# Patient Record
Sex: Male | Born: 1975 | Race: White | Hispanic: No | Smoking: Former smoker
Health system: Southern US, Community
[De-identification: ages and names within clinical notes are randomized; demographics above are authoritative.]

## PROBLEM LIST (undated history)

## (undated) DIAGNOSIS — R569 Unspecified convulsions: Secondary | ICD-10-CM

## (undated) DIAGNOSIS — F1011 Alcohol abuse, in remission: Secondary | ICD-10-CM

## (undated) DIAGNOSIS — Z8719 Personal history of other diseases of the digestive system: Secondary | ICD-10-CM

## (undated) DIAGNOSIS — I10 Essential (primary) hypertension: Secondary | ICD-10-CM

## (undated) DIAGNOSIS — K219 Gastro-esophageal reflux disease without esophagitis: Secondary | ICD-10-CM

## (undated) DIAGNOSIS — G629 Polyneuropathy, unspecified: Secondary | ICD-10-CM

## (undated) DIAGNOSIS — D696 Thrombocytopenia, unspecified: Secondary | ICD-10-CM

## (undated) DIAGNOSIS — F419 Anxiety disorder, unspecified: Secondary | ICD-10-CM

## (undated) DIAGNOSIS — T7840XA Allergy, unspecified, initial encounter: Secondary | ICD-10-CM

## (undated) DIAGNOSIS — K859 Acute pancreatitis without necrosis or infection, unspecified: Secondary | ICD-10-CM

## (undated) DIAGNOSIS — I639 Cerebral infarction, unspecified: Secondary | ICD-10-CM

## (undated) DIAGNOSIS — E119 Type 2 diabetes mellitus without complications: Secondary | ICD-10-CM

## (undated) HISTORY — DX: Allergy, unspecified, initial encounter: T78.40XA

## (undated) HISTORY — PX: ANTERIOR CRUCIATE LIGAMENT REPAIR: SHX115

## (undated) HISTORY — DX: Unspecified convulsions: R56.9

## (undated) HISTORY — DX: Acute pancreatitis without necrosis or infection, unspecified: K85.90

## (undated) HISTORY — DX: Type 2 diabetes mellitus without complications: E11.9

## (undated) HISTORY — DX: Thrombocytopenia, unspecified: D69.6

## (undated) HISTORY — DX: Anxiety disorder, unspecified: F41.9

## (undated) HISTORY — DX: Essential (primary) hypertension: I10

## (undated) HISTORY — DX: Cerebral infarction, unspecified: I63.9

---

## 2005-07-07 DIAGNOSIS — I10 Essential (primary) hypertension: Secondary | ICD-10-CM

## 2005-07-07 HISTORY — DX: Essential (primary) hypertension: I10

## 2011-09-17 ENCOUNTER — Inpatient Hospital Stay: Payer: Self-pay | Admitting: *Deleted

## 2011-09-17 LAB — URINALYSIS, COMPLETE
Bilirubin,UR: NEGATIVE
Glucose,UR: >=500 mg/dL (ref 0–75)
Leukocyte Esterase: NEGATIVE
Nitrite: NEGATIVE
Ph: 5 (ref 4.5–8.0)
Protein: 100
RBC,UR: 1 /HPF (ref 0–5)
Specific Gravity: 1.033 (ref 1.003–1.030)
Squamous Epithelial: <1
WBC UR: <1 /HPF (ref 0–5)

## 2011-09-17 LAB — BASIC METABOLIC PANEL
Anion Gap: 23 — ABNORMAL HIGH (ref 7–16)
BUN: 14 mg/dL (ref 7–18)
Calcium, Total: 9.3 mg/dL (ref 8.5–10.1)
Chloride: 114 mmol/L — ABNORMAL HIGH (ref 98–107)
Co2: 10 mmol/L — CL (ref 21–32)
Creatinine: 0.96 mg/dL (ref 0.60–1.30)
EGFR (African American): >60
EGFR (Non-African Amer.): >60
Glucose: 232 mg/dL — ABNORMAL HIGH (ref 65–99)
Osmolality: 300 (ref 275–301)
Potassium: 3.4 mmol/L — ABNORMAL LOW (ref 3.5–5.1)
Sodium: 147 mmol/L — ABNORMAL HIGH (ref 136–145)

## 2011-09-17 LAB — TROPONIN I: Troponin-I: <0.02 ng/mL

## 2011-09-17 LAB — CBC
HCT: 52.2 % — ABNORMAL HIGH (ref 40.0–52.0)
HGB: 17.4 g/dL (ref 13.0–18.0)
MCH: 30.5 pg (ref 26.0–34.0)
MCHC: 33.3 g/dL (ref 32.0–36.0)
MCV: 92 fL (ref 80–100)
Platelet: 196 10*3/uL (ref 150–440)
RBC: 5.71 10*6/uL (ref 4.40–5.90)
RDW: 13.4 % (ref 11.5–14.5)
WBC: 13.4 10*3/uL — ABNORMAL HIGH (ref 3.8–10.6)

## 2011-09-17 LAB — COMPREHENSIVE METABOLIC PANEL
Albumin: 4.3 g/dL (ref 3.4–5.0)
Alkaline Phosphatase: 132 U/L (ref 50–136)
Anion Gap: 26 — ABNORMAL HIGH (ref 7–16)
BUN: 13 mg/dL (ref 7–18)
Bilirubin,Total: 0.6 mg/dL (ref 0.2–1.0)
Calcium, Total: 9.4 mg/dL (ref 8.5–10.1)
Chloride: 102 mmol/L (ref 98–107)
Co2: 11 mmol/L — ABNORMAL LOW (ref 21–32)
Creatinine: 1.21 mg/dL (ref 0.60–1.30)
EGFR (African American): >60
EGFR (Non-African Amer.): >60
Glucose: 640 mg/dL (ref 65–99)
Osmolality: 308 (ref 275–301)
Potassium: 3.3 mmol/L — ABNORMAL LOW (ref 3.5–5.1)
SGOT(AST): 10 U/L — ABNORMAL LOW (ref 15–37)
SGPT (ALT): 25 U/L
Sodium: 139 mmol/L (ref 136–145)
Total Protein: 8.9 g/dL — ABNORMAL HIGH (ref 6.4–8.2)

## 2011-09-17 LAB — HEMOGLOBIN A1C: Hemoglobin A1C: 15.4 % — ABNORMAL HIGH (ref 4.2–6.3)

## 2011-09-17 LAB — MAGNESIUM: Magnesium: 2.2 mg/dL

## 2011-09-17 LAB — LIPASE, BLOOD: Lipase: 199 U/L (ref 73–393)

## 2011-09-17 LAB — CK TOTAL AND CKMB (NOT AT ARMC)
CK, Total: 51 U/L (ref 35–232)
CK-MB: <0.5 ng/mL — ABNORMAL LOW (ref 0.5–3.6)

## 2011-09-18 LAB — BASIC METABOLIC PANEL
Anion Gap: 18 — ABNORMAL HIGH (ref 7–16)
Anion Gap: 15 (ref 7–16)
BUN: 15 mg/dL (ref 7–18)
BUN: 13 mg/dL (ref 7–18)
Calcium, Total: 9.1 mg/dL (ref 8.5–10.1)
Calcium, Total: 8.6 mg/dL (ref 8.5–10.1)
Chloride: 111 mmol/L — ABNORMAL HIGH (ref 98–107)
Chloride: 107 mmol/L (ref 98–107)
Co2: 15 mmol/L — ABNORMAL LOW (ref 21–32)
Co2: 17 mmol/L — ABNORMAL LOW (ref 21–32)
Creatinine: 1.04 mg/dL (ref 0.60–1.30)
Creatinine: 1.09 mg/dL (ref 0.60–1.30)
EGFR (African American): >60
EGFR (African American): >60
EGFR (Non-African Amer.): >60
EGFR (Non-African Amer.): >60
Glucose: 215 mg/dL — ABNORMAL HIGH (ref 65–99)
Glucose: 340 mg/dL — ABNORMAL HIGH (ref 65–99)
Osmolality: 294 (ref 275–301)
Osmolality: 291 (ref 275–301)
Potassium: 3.3 mmol/L — ABNORMAL LOW (ref 3.5–5.1)
Potassium: 4.3 mmol/L (ref 3.5–5.1)
Sodium: 144 mmol/L (ref 136–145)
Sodium: 139 mmol/L (ref 136–145)

## 2011-09-18 LAB — TROPONIN I: Troponin-I: <0.02 ng/mL

## 2011-09-19 LAB — BASIC METABOLIC PANEL
Anion Gap: 20 — ABNORMAL HIGH (ref 7–16)
BUN: 13 mg/dL (ref 7–18)
Calcium, Total: 9 mg/dL (ref 8.5–10.1)
Chloride: 107 mmol/L (ref 98–107)
Co2: 14 mmol/L — ABNORMAL LOW (ref 21–32)
Creatinine: 0.87 mg/dL (ref 0.60–1.30)
EGFR (African American): >60
EGFR (Non-African Amer.): >60
Glucose: 228 mg/dL — ABNORMAL HIGH (ref 65–99)
Osmolality: 289 (ref 275–301)
Potassium: 3 mmol/L — ABNORMAL LOW (ref 3.5–5.1)
Sodium: 141 mmol/L (ref 136–145)

## 2011-09-19 LAB — MAGNESIUM: Magnesium: 1.7 mg/dL — ABNORMAL LOW

## 2011-09-19 LAB — CBC WITH DIFFERENTIAL/PLATELET
Basophil #: 0 10*3/uL (ref 0.0–0.1)
Basophil %: 0.6 %
Eosinophil #: 0.1 10*3/uL (ref 0.0–0.7)
Eosinophil %: 1.4 %
HCT: 38 % — ABNORMAL LOW (ref 40.0–52.0)
HGB: 12.9 g/dL — ABNORMAL LOW (ref 13.0–18.0)
Lymphocyte #: 1.5 10*3/uL (ref 1.0–3.6)
Lymphocyte %: 18.5 %
MCH: 30.4 pg (ref 26.0–34.0)
MCHC: 33.9 g/dL (ref 32.0–36.0)
MCV: 90 fL (ref 80–100)
Monocyte #: 0.6 10*3/uL (ref 0.0–0.7)
Monocyte %: 7.2 %
Neutrophil #: 5.7 10*3/uL (ref 1.4–6.5)
Neutrophil %: 72.3 %
Platelet: 97 10*3/uL — ABNORMAL LOW (ref 150–440)
RBC: 4.23 10*6/uL — ABNORMAL LOW (ref 4.40–5.90)
RDW: 13.2 % (ref 11.5–14.5)
WBC: 7.9 10*3/uL (ref 3.8–10.6)

## 2011-09-19 LAB — LIPID PANEL
Cholesterol: 126 mg/dL (ref 0–200)
HDL Cholesterol: 37 mg/dL — ABNORMAL LOW (ref 40–60)
Ldl Cholesterol, Calc: 62 mg/dL (ref 0–100)
Triglycerides: 136 mg/dL (ref 0–200)
VLDL Cholesterol, Calc: 27 mg/dL (ref 5–40)

## 2011-09-20 LAB — BASIC METABOLIC PANEL
Anion Gap: 12 (ref 7–16)
BUN: 11 mg/dL (ref 7–18)
Calcium, Total: 9.2 mg/dL (ref 8.5–10.1)
Chloride: 103 mmol/L (ref 98–107)
Co2: 21 mmol/L (ref 21–32)
Creatinine: 0.82 mg/dL (ref 0.60–1.30)
EGFR (African American): >60
EGFR (Non-African Amer.): >60
Glucose: 153 mg/dL — ABNORMAL HIGH (ref 65–99)
Osmolality: 274 (ref 275–301)
Potassium: 2.6 mmol/L — ABNORMAL LOW (ref 3.5–5.1)
Sodium: 136 mmol/L (ref 136–145)

## 2011-09-20 LAB — CBC WITH DIFFERENTIAL/PLATELET
Basophil #: 0.1 10*3/uL (ref 0.0–0.1)
Basophil %: 1.3 %
Eosinophil #: 0.2 10*3/uL (ref 0.0–0.7)
Eosinophil %: 2.7 %
HCT: 37.6 % — ABNORMAL LOW (ref 40.0–52.0)
HGB: 13 g/dL (ref 13.0–18.0)
Lymphocyte #: 1.8 10*3/uL (ref 1.0–3.6)
Lymphocyte %: 30.2 %
MCH: 30.6 pg (ref 26.0–34.0)
MCHC: 34.5 g/dL (ref 32.0–36.0)
MCV: 89 fL (ref 80–100)
Monocyte #: 0.6 10*3/uL (ref 0.0–0.7)
Monocyte %: 9.7 %
Neutrophil #: 3.4 10*3/uL (ref 1.4–6.5)
Neutrophil %: 56.1 %
Platelet: 89 10*3/uL — ABNORMAL LOW (ref 150–440)
RBC: 4.23 10*6/uL — ABNORMAL LOW (ref 4.40–5.90)
RDW: 12.8 % (ref 11.5–14.5)
WBC: 6.1 10*3/uL (ref 3.8–10.6)

## 2011-09-20 LAB — POTASSIUM
Potassium: 2.7 mmol/L — ABNORMAL LOW (ref 3.5–5.1)
Potassium: 3.3 mmol/L — ABNORMAL LOW (ref 3.5–5.1)

## 2011-09-20 LAB — RAPID HIV-1/2 QL/CONFIRM: HIV-1/2,Rapid Ql: NEGATIVE

## 2011-09-22 ENCOUNTER — Ambulatory Visit: Payer: Self-pay | Admitting: Internal Medicine

## 2011-09-22 LAB — CANCER CENTER HEMOGLOBIN: HGB: 13.1 g/dL (ref 13.0–18.0)

## 2011-09-24 LAB — CBC CANCER CENTER
Basophil #: 0.2 x10 3/mm — ABNORMAL HIGH (ref 0.0–0.1)
Basophil %: 2.1 %
Eosinophil #: 0.3 x10 3/mm (ref 0.0–0.7)
Eosinophil %: 3.9 %
HCT: 36.4 % — ABNORMAL LOW (ref 40.0–52.0)
HGB: 12.8 g/dL — ABNORMAL LOW (ref 13.0–18.0)
Lymphocyte #: 1.6 x10 3/mm (ref 1.0–3.6)
Lymphocyte %: 21.3 %
MCH: 31.2 pg (ref 26.0–34.0)
MCHC: 35.1 g/dL (ref 32.0–36.0)
MCV: 89 fL (ref 80–100)
Monocyte #: 1 x10 3/mm — ABNORMAL HIGH (ref 0.0–0.7)
Monocyte %: 14.1 %
Neutrophil #: 4.3 x10 3/mm (ref 1.4–6.5)
Neutrophil %: 58.6 %
Platelet: 126 x10 3/mm — ABNORMAL LOW (ref 150–440)
RBC: 4.09 10*6/uL — ABNORMAL LOW (ref 4.40–5.90)
RDW: 13.3 % (ref 11.5–14.5)
WBC: 7.3 x10 3/mm (ref 3.8–10.6)

## 2011-09-24 LAB — IRON AND TIBC
Iron Bind.Cap.(Total): 301 ug/dL (ref 250–450)
Iron Saturation: 19 %
Iron: 57 ug/dL — ABNORMAL LOW (ref 65–175)
Unbound Iron-Bind.Cap.: 244 ug/dL

## 2011-09-24 LAB — MAGNESIUM: Magnesium: 1.5 mg/dL — ABNORMAL LOW

## 2011-09-24 LAB — FERRITIN: Ferritin (ARMC): 784 ng/mL — ABNORMAL HIGH (ref 8–388)

## 2011-09-24 LAB — POTASSIUM: Potassium: 3.5 mmol/L (ref 3.5–5.1)

## 2011-09-24 LAB — RETICULOCYTES
Absolute Retic Count: 0.119 10*6/uL — ABNORMAL HIGH (ref 0.024–0.084)
Reticulocyte: 2.9 % — ABNORMAL HIGH (ref 0.5–1.5)

## 2011-09-25 LAB — PROT IMMUNOELECTROPHORES(ARMC)

## 2011-09-25 LAB — URINE IEP, RANDOM

## 2011-10-01 LAB — CANCER CTR PLATELET CT: Platelet: 200 x10 3/mm (ref 150–440)

## 2011-10-01 LAB — POTASSIUM: Potassium: 4.9 mmol/L (ref 3.5–5.1)

## 2011-10-06 ENCOUNTER — Ambulatory Visit: Payer: Self-pay | Admitting: Internal Medicine

## 2011-10-10 LAB — IRON AND TIBC
Iron Bind.Cap.(Total): 360 ug/dL (ref 250–450)
Iron Saturation: 23 %
Iron: 81 ug/dL (ref 65–175)
Unbound Iron-Bind.Cap.: 279 ug/dL

## 2011-10-10 LAB — CBC CANCER CENTER
Basophil #: 0.3 x10 3/mm — ABNORMAL HIGH (ref 0.0–0.1)
Basophil %: 3 %
Eosinophil #: 0.6 x10 3/mm (ref 0.0–0.7)
Eosinophil %: 5.8 %
HCT: 37 % — ABNORMAL LOW (ref 40.0–52.0)
HGB: 12.8 g/dL — ABNORMAL LOW (ref 13.0–18.0)
Lymphocyte #: 2.2 x10 3/mm (ref 1.0–3.6)
Lymphocyte %: 22.1 %
MCH: 30.8 pg (ref 26.0–34.0)
MCHC: 34.6 g/dL (ref 32.0–36.0)
MCV: 89 fL (ref 80–100)
Monocyte #: 0.7 x10 3/mm (ref 0.0–0.7)
Monocyte %: 6.7 %
Neutrophil #: 6.1 x10 3/mm (ref 1.4–6.5)
Neutrophil %: 62.4 %
Platelet: 272 x10 3/mm (ref 150–440)
RBC: 4.15 10*6/uL — ABNORMAL LOW (ref 4.40–5.90)
RDW: 13.7 % (ref 11.5–14.5)
WBC: 9.7 x10 3/mm (ref 3.8–10.6)

## 2011-10-10 LAB — FERRITIN: Ferritin (ARMC): 513 ng/mL — ABNORMAL HIGH (ref 8–388)

## 2011-10-10 LAB — CREATININE, SERUM
Creatinine: 0.75 mg/dL (ref 0.60–1.30)
EGFR (African American): >60
EGFR (Non-African Amer.): >60

## 2011-10-10 LAB — POTASSIUM: Potassium: 4 mmol/L (ref 3.5–5.1)

## 2011-10-30 LAB — CANCER CTR PLATELET CT: Platelet: 191 x10 3/mm (ref 150–440)

## 2011-10-30 LAB — CANCER CENTER HEMOGLOBIN: HGB: 13.9 g/dL (ref 13.0–18.0)

## 2011-11-05 ENCOUNTER — Ambulatory Visit: Payer: Self-pay | Admitting: Internal Medicine

## 2013-08-31 DIAGNOSIS — I639 Cerebral infarction, unspecified: Secondary | ICD-10-CM

## 2013-08-31 HISTORY — DX: Cerebral infarction, unspecified: I63.9

## 2014-08-07 DIAGNOSIS — R569 Unspecified convulsions: Secondary | ICD-10-CM

## 2014-08-07 HISTORY — DX: Unspecified convulsions: R56.9

## 2014-08-31 ENCOUNTER — Inpatient Hospital Stay (HOSPITAL_COMMUNITY)
Admission: AD | Admit: 2014-08-31 | Discharge: 2014-09-04 | DRG: 064 | Disposition: A | Discharge status: Home / Self Care | Payer: BLUE CROSS/BLUE SHIELD | Source: Other Acute Inpatient Hospital | Attending: Neurology | Admitting: Neurology

## 2014-08-31 ENCOUNTER — Encounter (HOSPITAL_COMMUNITY): Payer: Self-pay | Admitting: *Deleted

## 2014-08-31 ENCOUNTER — Emergency Department: Payer: Self-pay | Admitting: Emergency Medicine

## 2014-08-31 DIAGNOSIS — F1010 Alcohol abuse, uncomplicated: Secondary | ICD-10-CM | POA: Diagnosis present

## 2014-08-31 DIAGNOSIS — E669 Obesity, unspecified: Secondary | ICD-10-CM | POA: Diagnosis present

## 2014-08-31 DIAGNOSIS — G4700 Insomnia, unspecified: Secondary | ICD-10-CM | POA: Diagnosis present

## 2014-08-31 DIAGNOSIS — E1165 Type 2 diabetes mellitus with hyperglycemia: Secondary | ICD-10-CM | POA: Diagnosis present

## 2014-08-31 DIAGNOSIS — I611 Nontraumatic intracerebral hemorrhage in hemisphere, cortical: Secondary | ICD-10-CM | POA: Diagnosis present

## 2014-08-31 DIAGNOSIS — Z9119 Patient's noncompliance with other medical treatment and regimen: Secondary | ICD-10-CM | POA: Diagnosis present

## 2014-08-31 DIAGNOSIS — R569 Unspecified convulsions: Secondary | ICD-10-CM

## 2014-08-31 DIAGNOSIS — F411 Generalized anxiety disorder: Secondary | ICD-10-CM | POA: Diagnosis present

## 2014-08-31 DIAGNOSIS — G936 Cerebral edema: Secondary | ICD-10-CM | POA: Diagnosis present

## 2014-08-31 DIAGNOSIS — I10 Essential (primary) hypertension: Secondary | ICD-10-CM | POA: Diagnosis present

## 2014-08-31 DIAGNOSIS — Z9114 Patient's other noncompliance with medication regimen: Secondary | ICD-10-CM

## 2014-08-31 DIAGNOSIS — G4089 Other seizures: Secondary | ICD-10-CM | POA: Diagnosis present

## 2014-08-31 DIAGNOSIS — R58 Hemorrhage, not elsewhere classified: Secondary | ICD-10-CM

## 2014-08-31 DIAGNOSIS — I619 Nontraumatic intracerebral hemorrhage, unspecified: Secondary | ICD-10-CM | POA: Diagnosis present

## 2014-08-31 DIAGNOSIS — E119 Type 2 diabetes mellitus without complications: Secondary | ICD-10-CM

## 2014-08-31 LAB — GLUCOSE, CAPILLARY
Glucose-Capillary: 456 mg/dL — ABNORMAL HIGH (ref 70–99)
Glucose-Capillary: 329 mg/dL — ABNORMAL HIGH (ref 70–99)

## 2014-08-31 LAB — MRSA PCR SCREENING: MRSA by PCR: NEGATIVE

## 2014-08-31 MED ORDER — FOLIC ACID 1 MG PO TABS
1.0000 mg | ORAL_TABLET | Freq: Every day | ORAL | Status: DC
  Administered 2014-08-31 – 2014-09-04 (×4): 1 mg via ORAL
  Filled 2014-08-31 (×6): qty 1

## 2014-08-31 MED ORDER — LORAZEPAM 2 MG/ML IJ SOLN
0.0000 mg | Freq: Four times a day (QID) | INTRAMUSCULAR | Status: AC
Start: 2014-08-31 — End: 2014-09-02

## 2014-08-31 MED ORDER — ACETAMINOPHEN 650 MG RE SUPP: 650.0000 mg | RECTAL | Status: DC | PRN

## 2014-08-31 MED ORDER — PNEUMOCOCCAL VAC POLYVALENT 25 MCG/0.5ML IJ INJ
0.5000 mL | INJECTION | INTRAMUSCULAR | Status: AC
  Administered 2014-09-01: 0.5 mL via INTRAMUSCULAR
  Filled 2014-08-31: qty 0.5

## 2014-08-31 MED ORDER — LORAZEPAM 2 MG/ML IJ SOLN
1.0000 mg | Freq: Four times a day (QID) | INTRAMUSCULAR | Status: AC | PRN
  Administered 2014-09-02: 1 mg via INTRAVENOUS
  Filled 2014-08-31: qty 1

## 2014-08-31 MED ORDER — LEVETIRACETAM 500 MG PO TABS
500.0000 mg | ORAL_TABLET | Freq: Two times a day (BID) | ORAL | Status: DC
  Administered 2014-09-01 – 2014-09-04 (×7): 500 mg via ORAL
  Filled 2014-08-31 (×7): qty 1

## 2014-08-31 MED ORDER — LEVETIRACETAM IN NACL 1000 MG/100ML IV SOLN
1000.0000 mg | Freq: Once | INTRAVENOUS | Status: AC
  Administered 2014-08-31: 1000 mg via INTRAVENOUS
  Filled 2014-08-31 (×2): qty 100

## 2014-08-31 MED ORDER — SENNOSIDES-DOCUSATE SODIUM 8.6-50 MG PO TABS
1.0000 | ORAL_TABLET | Freq: Two times a day (BID) | ORAL | Status: DC
Start: 2014-08-31 — End: 2014-09-04
  Administered 2014-08-31 – 2014-09-04 (×7): 1 via ORAL
  Filled 2014-08-31 (×8): qty 1

## 2014-08-31 MED ORDER — LABETALOL HCL 5 MG/ML IV SOLN
10.0000 mg | INTRAVENOUS | Status: DC | PRN
  Administered 2014-09-01: 20 mg via INTRAVENOUS
  Filled 2014-08-31: qty 4

## 2014-08-31 MED ORDER — INFLUENZA VAC SPLIT QUAD 0.5 ML IM SUSY
0.5000 mL | PREFILLED_SYRINGE | INTRAMUSCULAR | Status: AC
  Administered 2014-09-01: 0.5 mL via INTRAMUSCULAR
  Filled 2014-08-31: qty 0.5

## 2014-08-31 MED ORDER — ACETAMINOPHEN 325 MG PO TABS: 650.0000 mg | ORAL_TABLET | ORAL | Status: DC | PRN

## 2014-08-31 MED ORDER — THIAMINE HCL 100 MG/ML IJ SOLN
100.0000 mg | Freq: Every day | INTRAMUSCULAR | Status: DC
  Administered 2014-09-01: 100 mg via INTRAVENOUS
  Filled 2014-08-31 (×2): qty 2
  Filled 2014-08-31 (×2): qty 1
  Filled 2014-08-31: qty 2

## 2014-08-31 MED ORDER — LORAZEPAM 2 MG/ML IJ SOLN: 0.0000 mg | Freq: Two times a day (BID) | INTRAMUSCULAR | Status: DC

## 2014-08-31 MED ORDER — VITAMIN B-1 100 MG PO TABS
100.0000 mg | ORAL_TABLET | Freq: Every day | ORAL | Status: DC
  Administered 2014-08-31 – 2014-09-04 (×4): 100 mg via ORAL
  Filled 2014-08-31 (×6): qty 1

## 2014-08-31 MED ORDER — LORAZEPAM 1 MG PO TABS: 1.0000 mg | ORAL_TABLET | Freq: Four times a day (QID) | ORAL | Status: AC | PRN

## 2014-08-31 MED ORDER — PANTOPRAZOLE SODIUM 40 MG IV SOLR
40.0000 mg | Freq: Every day | INTRAVENOUS | Status: DC
Start: 2014-08-31 — End: 2014-09-04
  Administered 2014-08-31 – 2014-09-03 (×4): 40 mg via INTRAVENOUS
  Filled 2014-08-31 (×4): qty 40

## 2014-08-31 MED ORDER — STROKE: EARLY STAGES OF RECOVERY BOOK
Freq: Once | Status: AC
  Administered 2014-08-31: 19:00:00
  Filled 2014-08-31: qty 1

## 2014-08-31 MED ORDER — INSULIN ASPART 100 UNIT/ML ~~LOC~~ SOLN
0.0000 [IU] | Freq: Every day | SUBCUTANEOUS | Status: DC
  Administered 2014-08-31 – 2014-09-01 (×2): 4 [IU] via SUBCUTANEOUS
  Administered 2014-09-02: 3 [IU] via SUBCUTANEOUS
  Administered 2014-09-03: 4 [IU] via SUBCUTANEOUS

## 2014-08-31 MED ORDER — ADULT MULTIVITAMIN W/MINERALS CH
1.0000 | ORAL_TABLET | Freq: Every day | ORAL | Status: DC
  Administered 2014-08-31 – 2014-09-04 (×4): 1 via ORAL
  Filled 2014-08-31 (×5): qty 1

## 2014-08-31 MED ORDER — INSULIN ASPART 100 UNIT/ML ~~LOC~~ SOLN
0.0000 [IU] | Freq: Three times a day (TID) | SUBCUTANEOUS | Status: DC
  Administered 2014-09-01: 5 [IU] via SUBCUTANEOUS
  Administered 2014-09-01: 8 [IU] via SUBCUTANEOUS
  Administered 2014-09-01: 15 [IU] via SUBCUTANEOUS
  Administered 2014-09-02: 11 [IU] via SUBCUTANEOUS
  Administered 2014-09-02: 8 [IU] via SUBCUTANEOUS
  Administered 2014-09-02: 5 [IU] via SUBCUTANEOUS
  Administered 2014-09-03: 11 [IU] via SUBCUTANEOUS
  Administered 2014-09-03 (×2): 8 [IU] via SUBCUTANEOUS
  Administered 2014-09-04: 5 [IU] via SUBCUTANEOUS

## 2014-08-31 NOTE — H&P (Addendum)
Admission H&P    Chief Complaint: Left sided twitching HPI: Daniel Ritter is an 39 y.o. male who reports that he awakened this morning with left sided tingling, drooling and involuntary movements of his tongue.  He went to work and had two repeat episodes and was noted by co-workers to "zone out"  Patient reports that he remembers everything but was unable to control things at times.  Reports that for the past week he has had episodes where he will have tingling in his left arm and the left side of his head including his scalp.  These episodes have been short-lived and resolved spontaneously.   Patient reports hitting his head on his truck at work on the top of his head.  There was no loss of consciousness but he did develop a hematoma.  Date last known well: Date: 08/30/2014 Time last known well: Time: 23:30 tPA Given: No: ICH  ICH Score: 0     Past medical history:  Diabetes (taken off all meds due to diet control), HTN  Past surgical history: None  Family history: Father with prostate CA and CAD, Mother with hypertension  Social History:  Patient does not smoke.  Drinks daily.  No history of illicit drug abuse  Allergies: NKDA  Medications: Maxzide    ROS: History obtained from the patient  General ROS: negative for - chills, fatigue, fever, night sweats, weight gain or weight loss Psychological ROS: negative for - behavioral disorder, hallucinations, memory difficulties, mood swings or suicidal ideation Ophthalmic ROS: negative for - blurry vision, double vision, eye pain or loss of vision ENT ROS: negative for - epistaxis, nasal discharge, oral lesions, sore throat, tinnitus or vertigo Allergy and Immunology ROS: negative for - hives or itchy/watery eyes Hematological and Lymphatic ROS: negative for - bleeding problems, bruising or swollen lymph nodes Endocrine ROS: polydipsia/polyuria  Respiratory ROS: negative for - cough, hemoptysis, shortness of breath or  wheezing Cardiovascular ROS: negative for - chest pain, dyspnea on exertion, edema or irregular heartbeat Gastrointestinal ROS: negative for - abdominal pain, diarrhea, hematemesis, nausea/vomiting or stool incontinence Genito-Urinary ROS: negative for - dysuria, hematuria, incontinence or urinary frequency/urgency Musculoskeletal ROS: negative for - joint swelling or muscular weakness Neurological ROS: as noted in HPI Dermatological ROS: rash occasionally on arms  Physical Examination: Blood pressure 153/93, pulse 93, temperature 98.7 F (37.1 C), resp. rate 23, SpO2 94 %.  General Examination: HEENT-  Normocephalic, no lesions, without obvious abnormality.  Normal external eye and conjunctiva.  Normal TM's bilaterally.  Normal auditory canals and external ears. Normal external nose, mucus membranes and septum.  Normal pharynx. Cardiovascular- S1, S2 normal, pulses palpable throughout   Lungs- chest clear, no wheezing, rales, normal symmetric air entry Abdomen- soft, non-tender; bowel sounds normal; no masses,  no organomegaly Extremities- no edema Lymph-no adenopathy palpable Musculoskeletal-no joint tenderness, deformity or swelling Skin-small rash on forearms bilaterally  Neurological Examination Mental Status: Alert, oriented, thought content appropriate.  Speech fluent without evidence of aphasia.  Able to follow 3 step commands without difficulty. Cranial Nerves: II: Discs flat bilaterally; Visual fields grossly normal, pupils equal, round, reactive to light and accommodation III,IV, VI: ptosis not present, extra-ocular motions intact bilaterally V,VII: smile symmetric, facial light touch sensation normal bilaterally VIII: hearing normal bilaterally IX,X: gag reflex present XI: bilateral shoulder shrug XII: midline tongue extension Motor: Right : Upper extremity   5/5    Left:     Upper extremity   5/5  Lower extremity  5/5     Lower extremity   5/5 Tone and bulk:normal  tone throughout; no atrophy noted. Some involuntary twitching of the bottom lip on the left Sensory: Pinprick and light touch intact throughout, bilaterally Deep Tendon Reflexes: 2+ and symmetric throughout Plantars: Right: downgoing   Left: downgoing Cerebellar: normal finger-to-nose and normal heel-to-shin testing bilaterally Gait: Unable to test due to safety   Laboratory Studies:  Labs from [place] reviewed.  Patient with mildly elevated AP at 120 and CBG of 496.  Glucose in urine. Basic Metabolic Panel: No results for input(s): NA, K, CL, CO2, GLUCOSE, BUN, CREATININE, CALCIUM, MG, PHOS in the last 168 hours.  Liver Function Tests: No results for input(s): AST, ALT, ALKPHOS, BILITOT, PROT, ALBUMIN in the last 168 hours. No results for input(s): LIPASE, AMYLASE in the last 168 hours. No results for input(s): AMMONIA in the last 168 hours.  CBC: No results for input(s): WBC, NEUTROABS, HGB, HCT, MCV, PLT in the last 168 hours.  Cardiac Enzymes: No results for input(s): CKTOTAL, CKMB, CKMBINDEX, TROPONINI in the last 168 hours.  BNP: Invalid input(s): POCBNP  CBG: No results for input(s): GLUCAP in the last 168 hours.  Microbiology: No results found for this or any previous visit.  Coagulation Studies: No results for input(s): LABPROT, INR in the last 72 hours.  Urinalysis: No results for input(s): COLORURINE, LABSPEC, PHURINE, GLUCOSEU, HGBUR, BILIRUBINUR, KETONESUR, PROTEINUR, UROBILINOGEN, NITRITE, LEUKOCYTESUR in the last 168 hours.  Invalid input(s): APPERANCEUR  Lipid Panel:  No results found for: CHOL, TRIG, HDL, CHOLHDL, VLDL, LDLCALC  HgbA1C: No results found for: HGBA1C  Urine Drug Screen:  No results found for: LABOPIA, COCAINSCRNUR, LABBENZ, AMPHETMU, THCU, LABBARB  Alcohol Level: No results for input(s): ETH in the last 168 hours.  Other results: EKG: normal sinus rhythm at 90 bpm.  Imaging: No results found.  Assessment: 39 y.o. male presenting  with episodes of left sided tingling with involuntary movements on the left side of the face.  Likely partial seizures.  Head CT personally reviewed and shows a small right parietal intracerebral hemorrhage.  Etiology unclear. Patient also with poorly controlled diabetes.  Initially BP was elevated but has improved spontaneously.    Stroke Risk Factors - diabetes mellitus and hypertension  Plan: 1. HgbA1c, fasting lipid panel 2. MRI, MRA  of the brain without contrast 3. PT consult, OT consult, Speech consult 4. Echocardiogram 5. Carotid dopplers 6. Prophylactic therapy-None 7. NPO until RN stroke swallow screen.  Afterwards will start a carb modified diet.   8. Telemetry monitoring 9. Frequent neuro checks 10. CBG qAC and QHS with coverage 11. BP control 12. CIWA 13. Keppra 1000mg  IV now with maintenance of 500mg  BID to start in AM.    This patient is critically ill and at significant risk of neurological worsening, death and care requires constant monitoring of vital signs, hemodynamics,respiratory and cardiac monitoring, neurological assessment, discussion with family, other specialists and medical decision making of high complexity. I spent 60inutes of neurocritical care time  in the care of  this patient.   Alexis Goodell, MD [facility] [phone] 08/31/2014, 6:56 PM

## 2014-09-01 ENCOUNTER — Inpatient Hospital Stay (HOSPITAL_COMMUNITY): Payer: BLUE CROSS/BLUE SHIELD

## 2014-09-01 DIAGNOSIS — G936 Cerebral edema: Secondary | ICD-10-CM

## 2014-09-01 DIAGNOSIS — R58 Hemorrhage, not elsewhere classified: Secondary | ICD-10-CM

## 2014-09-01 LAB — PROTIME-INR
INR: 1.01 (ref 0.00–1.49)
Prothrombin Time: 13.4 seconds (ref 11.6–15.2)

## 2014-09-01 LAB — GLUCOSE, CAPILLARY
Glucose-Capillary: 230 mg/dL — ABNORMAL HIGH (ref 70–99)
Glucose-Capillary: 398 mg/dL — ABNORMAL HIGH (ref 70–99)
Glucose-Capillary: 291 mg/dL — ABNORMAL HIGH (ref 70–99)
Glucose-Capillary: 332 mg/dL — ABNORMAL HIGH (ref 70–99)

## 2014-09-01 LAB — BASIC METABOLIC PANEL
Anion gap: 6 (ref 5–15)
BUN: 11 mg/dL (ref 6–23)
CO2: 24 mmol/L (ref 19–32)
Calcium: 8.8 mg/dL (ref 8.4–10.5)
Chloride: 103 mmol/L (ref 96–112)
Creatinine, Ser: 0.8 mg/dL (ref 0.50–1.35)
GFR calc Af Amer: >90 mL/min (ref >90)
GFR calc non Af Amer: >90 mL/min (ref >90)
Glucose, Bld: 306 mg/dL — ABNORMAL HIGH (ref 70–99)
Potassium: 3.6 mmol/L (ref 3.5–5.1)
Sodium: 133 mmol/L — ABNORMAL LOW (ref 135–145)

## 2014-09-01 LAB — CBC
HCT: 39.6 % (ref 39.0–52.0)
Hemoglobin: 13.9 g/dL (ref 13.0–17.0)
MCH: 30 pg (ref 26.0–34.0)
MCHC: 35.1 g/dL (ref 30.0–36.0)
MCV: 85.5 fL (ref 78.0–100.0)
Platelets: 140 10*3/uL — ABNORMAL LOW (ref 150–400)
RBC: 4.63 MIL/uL (ref 4.22–5.81)
RDW: 13 % (ref 11.5–15.5)
WBC: 7 10*3/uL (ref 4.0–10.5)

## 2014-09-01 LAB — RAPID URINE DRUG SCREEN, HOSP PERFORMED
Amphetamines: NOT DETECTED
Barbiturates: NOT DETECTED
Benzodiazepines: POSITIVE — AB
Cocaine: NOT DETECTED
Opiates: NOT DETECTED
Tetrahydrocannabinol: NOT DETECTED

## 2014-09-01 LAB — APTT: aPTT: 26 seconds (ref 24–37)

## 2014-09-01 LAB — SEDIMENTATION RATE: Sed Rate: 17 mm/hr — ABNORMAL HIGH (ref 0–16)

## 2014-09-01 MED ORDER — HYDRALAZINE HCL 20 MG/ML IJ SOLN
INTRAMUSCULAR | Status: AC
  Administered 2014-09-01: 5 mg via INTRAVENOUS
  Filled 2014-09-01: qty 1

## 2014-09-01 MED ORDER — HEPARIN SODIUM (PORCINE) 1000 UNIT/ML IJ SOLN
INTRAMUSCULAR | Status: AC | PRN
  Administered 2014-09-01: 1000 [IU] via INTRAVENOUS
  Administered 2014-09-01: 500 [IU] via INTRAVENOUS

## 2014-09-01 MED ORDER — MIDAZOLAM HCL 2 MG/2ML IJ SOLN
INTRAMUSCULAR | Status: AC | PRN
  Administered 2014-09-01: 1 mg via INTRAVENOUS

## 2014-09-01 MED ORDER — HYDRALAZINE HCL 20 MG/ML IJ SOLN
INTRAMUSCULAR | Status: AC
  Filled 2014-09-01: qty 1

## 2014-09-01 MED ORDER — HYDRALAZINE HCL 20 MG/ML IJ SOLN
5.0000 mg | INTRAMUSCULAR | Status: DC
  Administered 2014-09-01 (×3): 5 mg via INTRAVENOUS

## 2014-09-01 MED ORDER — FENTANYL CITRATE 0.05 MG/ML IJ SOLN
INTRAMUSCULAR | Status: AC
  Filled 2014-09-01: qty 4

## 2014-09-01 MED ORDER — IOHEXOL 300 MG/ML  SOLN
150.0000 mL | Freq: Once | INTRAMUSCULAR | Status: AC | PRN
  Administered 2014-09-01: 80 mL via INTRAVENOUS

## 2014-09-01 MED ORDER — MIDAZOLAM HCL 2 MG/2ML IJ SOLN
INTRAMUSCULAR | Status: AC
  Filled 2014-09-01: qty 4

## 2014-09-01 MED ORDER — FENTANYL CITRATE 0.05 MG/ML IJ SOLN
INTRAMUSCULAR | Status: AC | PRN
  Administered 2014-09-01 (×2): 25 ug via INTRAVENOUS

## 2014-09-01 MED ORDER — HEPARIN SOD (PORK) LOCK FLUSH 100 UNIT/ML IV SOLN
INTRAVENOUS | Status: AC
  Filled 2014-09-01: qty 10

## 2014-09-01 MED ORDER — SODIUM CHLORIDE 0.9 % IV SOLN: INTRAVENOUS | Status: AC

## 2014-09-01 NOTE — Progress Notes (Signed)
OT Cancellation Note  Patient Details Name: Daniel Ritter MRN: [id number] DOB: Jul 16, 1975   Cancelled Treatment:    Reason Eval/Treat Not Completed: Medical issues which prohibited therapy. Pt on bedrest.  Almon Register [phone] 09/01/2014, 11:24 AM

## 2014-09-01 NOTE — Progress Notes (Addendum)
Discussed case with Dr. Armida Sans, with negative arteriogram and scheduled for TEE in AM. Order to move pt to floor; 4N verified with MD.

## 2014-09-01 NOTE — Progress Notes (Signed)
Pt transported to IR with transport and RN.

## 2014-09-01 NOTE — Procedures (Signed)
S/P 4 vessel cerebral arteriogram. RT CFA aprroach. Findings. 1.No occlusions,stenosis,dissections ,AVshunting or aneurysms  seen. Venous outflow WNLs

## 2014-09-01 NOTE — Progress Notes (Signed)
PT Cancellation Note  Patient Details Name: Daniel Ritter MRN: [id number] DOB: 1976-01-04   Cancelled Treatment:    Reason Eval/Treat Not Completed: Patient not medically ready.  Pt currently on bedrest.  Please update activity order when pt becomes appropriate for mobility.  Will f/u as appropriate.     Braniya Farrugia, Thornton Papas 09/01/2014, 7:51 AM

## 2014-09-01 NOTE — Consult Note (Signed)
Chief Complaint: Intracranial hemorrhage  Referring Physician(s): Dr Doy Mince  History of Present Illness: Daniel Ritter is a 39 y.o. male   Pt developed left sided facial tingling off and on for few days Worsened yesterday and accompanied by L facial twitching and involuntary tongue movements Did admit to hitting head at work the other day with + hematoma CT head reveals R parietal intracranial hemorrhage Now scheduled for cerebral arteriogram for further evaluation  History reviewed. No pertinent past medical history.  History reviewed. No pertinent past surgical history.  Allergies: Review of patient's allergies indicates not on file.  Medications: Prior to Admission medications   Medication Sig Start Date End Date Taking? Authorizing Provider  lisinopril-hydrochlorothiazide (PRINZIDE,ZESTORETIC) 20-12.5 MG per tablet Take 1 tablet by mouth daily.   Yes Historical Provider, MD  metoprolol succinate (TOPROL-XL) 100 MG 24 hr tablet Take 100 mg by mouth at bedtime. Take with or immediately following a meal.   Yes Historical Provider, MD     History reviewed. No pertinent family history.  History   Social History  . Marital Status: Married    Spouse Name: N/A  . Number of Children: N/A  . Years of Education: N/A   Social History Main Topics  . Smoking status: Not on file  . Smokeless tobacco: Not on file  . Alcohol Use: Not on file  . Drug Use: Not on file  . Sexual Activity: Not on file   Other Topics Concern  . None   Social History Narrative  . None     Review of Systems: A 12 point ROS discussed and pertinent positives are indicated in the HPI above.  All other systems are negative.  Review of Systems  Constitutional: Negative for activity change and fatigue.  HENT: Negative for trouble swallowing and voice change.   Eyes: Negative for visual disturbance.  Respiratory: Negative for shortness of breath.   Cardiovascular: Negative for chest pain.    Gastrointestinal: Negative for abdominal pain.  Genitourinary: Negative for dysuria.  Musculoskeletal: Negative for gait problem, neck pain and neck stiffness.  Neurological: Positive for seizures, weakness, numbness and headaches. Negative for dizziness, syncope, facial asymmetry, speech difficulty and light-headedness.  Psychiatric/Behavioral: Negative for behavioral problems and confusion.     Vital Signs: BP 150/99 mmHg  Pulse 83  Temp(Src) 98.6 F (37 C) (Oral)  Resp 17  Ht 6\' 1"  (1.854 m)  Wt 136.6 kg (301 lb 2.4 oz)  BMI 39.74 kg/m2  SpO2 97%  Physical Exam  Constitutional: He is oriented to person, place, and time. He appears well-developed and well-nourished.  Cardiovascular: Normal rate, regular rhythm and normal heart sounds.   No murmur heard. Pulmonary/Chest: Effort normal and breath sounds normal. He has no wheezes.  Abdominal: Soft. Bowel sounds are normal. There is no tenderness.  Musculoskeletal: Normal range of motion.  Neurological: He is alert and oriented to person, place, and time.  Skin: Skin is warm and dry.  Psychiatric: He has a normal mood and affect. His behavior is normal. Judgment and thought content normal.  Nursing note and vitals reviewed.   Mallampati Score:  MD Evaluation Airway: WNL Heart: WNL Abdomen: WNL Chest/ Lungs: WNL ASA  Classification: 3 Mallampati/Airway Score: One  Imaging: Mr Virgel Paling Wo Contrast  09/01/2014   CLINICAL DATA:  Intercerebral hemorrhage. Left-sided tingling, drooling, and involuntary tongue movements. Patient hit top of head on truck at work ; no loss of consciousness but positive soft tissue hematoma.  EXAM: MRI  HEAD WITHOUT CONTRAST  MRA HEAD WITHOUT CONTRAST  TECHNIQUE: Multiplanar, multiecho pulse sequences of the brain and surrounding structures were obtained without intravenous contrast. Angiographic images of the head were obtained using MRA technique without contrast.  COMPARISON:  Head CT 08/31/2014   FINDINGS: MRI HEAD FINDINGS  There is no acute infarct, midline shift, or extra-axial fluid collection. As seen on recent CT, there is a 1.3 cm focus of hemorrhage in the right parietal operculum which demonstrates peripheral T1 hyperintensity and diffuse but mildly heterogeneous T2 hyperintensity with associated susceptibility artifact including suggestion of a thin peripheral rim of susceptibility. There is also suggestion of a small amount of perilesional T1 hyperintensity. There is at most minimal surrounding edema.  Mildly dilated perivascular spaces versus punctate lacunar infarcts are noted posteriorly in the right lentiform nucleus. Ventricles and sulci are normal for age. No significant white matter disease is present.  Orbits are unremarkable. Paranasal sinuses and mastoid air cells are clear. Major intracranial vascular flow voids are preserved.  MRA HEAD FINDINGS  Images are moderately degraded by motion artifact. Visualized distal vertebral arteries are patent with the left being dominant and without significant stenosis. PICA and SCA origins are grossly patent. There is apparent narrowing of the proximal left greater than right SCA is, however evaluation is limited by motion artifact through this region. Basilar artery is patent without evidence of significant stenosis. PCAs are patent without significant stenosis. Posterior communicating arteries are not clearly identified.  Internal carotid arteries are patent from skullbase to carotid termini without evidence of significant stenosis, although the cavernous segments are suboptimally evaluated due to motion artifact. M1 segments are patent without stenosis. All major MCA branch vessels are grossly patent, with evaluation for stenosis limited by motion. ACAs are patent without evidence of significant proximal stenosis. No sizable intracranial aneurysm is identified.  IMPRESSION: 1. 1.3 cm focus of subacute hemorrhage in the right parietal operculum  with only trace surrounding edema. This is favored to reflect recent hemorrhage in a cavernoma. Posttraumatic hemorrhagic contusion is possible. Underlying mass/ neoplasm is felt unlikely but not completely excluded, and postcontrast imaging is recommended. 2. No acute infarct. 3. Moderately motion degraded MRA without major intracranial arterial occlusion or significant proximal stenosis identified.   Electronically Signed   By: Logan Bores   On: 09/01/2014 07:52   [name]  09/01/2014   CLINICAL DATA:  Intercerebral hemorrhage. Left-sided tingling, drooling, and involuntary tongue movements. Patient hit top of head on truck at work ; no loss of consciousness but positive soft tissue hematoma.  EXAM: MRI HEAD WITHOUT CONTRAST  MRA HEAD WITHOUT CONTRAST  TECHNIQUE: Multiplanar, multiecho pulse sequences of the brain and surrounding structures were obtained without intravenous contrast. Angiographic images of the head were obtained using MRA technique without contrast.  COMPARISON:  Head CT 08/31/2014  FINDINGS: MRI HEAD FINDINGS  There is no acute infarct, midline shift, or extra-axial fluid collection. As seen on recent CT, there is a 1.3 cm focus of hemorrhage in the right parietal operculum which demonstrates peripheral T1 hyperintensity and diffuse but mildly heterogeneous T2 hyperintensity with associated susceptibility artifact including suggestion of a thin peripheral rim of susceptibility. There is also suggestion of a small amount of perilesional T1 hyperintensity. There is at most minimal surrounding edema.  Mildly dilated perivascular spaces versus punctate lacunar infarcts are noted posteriorly in the right lentiform nucleus. Ventricles and sulci are normal for age. No significant white matter disease is present.  Orbits are unremarkable.  Paranasal sinuses and mastoid air cells are clear. Major intracranial vascular flow voids are preserved.  MRA HEAD FINDINGS  Images are moderately  degraded by motion artifact. Visualized distal vertebral arteries are patent with the left being dominant and without significant stenosis. PICA and SCA origins are grossly patent. There is apparent narrowing of the proximal left greater than right SCA is, however evaluation is limited by motion artifact through this region. Basilar artery is patent without evidence of significant stenosis. PCAs are patent without significant stenosis. Posterior communicating arteries are not clearly identified.  Internal carotid arteries are patent from skullbase to carotid termini without evidence of significant stenosis, although the cavernous segments are suboptimally evaluated due to motion artifact. M1 segments are patent without stenosis. All major MCA branch vessels are grossly patent, with evaluation for stenosis limited by motion. ACAs are patent without evidence of significant proximal stenosis. No sizable intracranial aneurysm is identified.  IMPRESSION: 1. 1.3 cm focus of subacute hemorrhage in the right parietal operculum with only trace surrounding edema. This is favored to reflect recent hemorrhage in a cavernoma. Posttraumatic hemorrhagic contusion is possible. Underlying mass/ neoplasm is felt unlikely but not completely excluded, and postcontrast imaging is recommended. 2. No acute infarct. 3. Moderately motion degraded MRA without major intracranial arterial occlusion or significant proximal stenosis identified.   Electronically Signed   By: Logan Bores   On: 09/01/2014 07:52    Labs:  CBC: No results for input(s): WBC, HGB, HCT, PLT in the last 8760 hours.  COAGS: No results for input(s): INR, APTT in the last 8760 hours.  BMP: No results for input(s): NA, K, CL, CO2, GLUCOSE, BUN, CALCIUM, CREATININE, GFRNONAA, GFRAA in the last 8760 hours.  Invalid input(s): CMP  LIVER FUNCTION TESTS: No results for input(s): BILITOT, AST, ALT, ALKPHOS, PROT, ALBUMIN in the last 8760 hours.  TUMOR  MARKERS: No results for input(s): AFPTM, CEA, CA199, CHROMGRNA in the last 8760 hours.  Assessment and Plan:  ICH  scheduled for cerebral arteriogram Pt and family aware of procedure benefits and risks including but not limited to Infection; bleeding; vessel damage; CVA Agreeable to proceed Consent signed and in chart  Thank you for this interesting consult.  I greatly enjoyed meeting JB DULWORTH and look forward to participating in their care.  Signed: Bradrick Kamau A 09/01/2014, 10:19 AM   I spent a total of 40 Minutes  in face to face in clinical consultation, greater than 50% of which was counseling/coordinating care for cerebral arteriogram

## 2014-09-01 NOTE — Progress Notes (Signed)
STROKE TEAM PROGRESS NOTE   HISTORY Daniel Ritter is an 39 y.o. male who reports that he awakened this morning with left sided tingling, drooling and involuntary movements of his tongue. He went to work and had two repeat episodes and was noted by co-workers to "zone out" Patient reports that he remembers everything but was unable to control things at times. Reports that for the past week he has had episodes where he will have tingling in his left arm and the left side of his head including his scalp. These episodes have been short-lived and resolved spontaneously.  Patient reports hitting his head on his truck at work on the top of his head. There was no loss of consciousness but he did develop a hematoma. He was last known well 08/30/2014 at 23:30. Patient was not administered TPA secondary to [place]. ICH Score: 0. He was admitted to the neuro ICU for further evaluation and treatment.   SUBJECTIVE (INTERVAL HISTORY) No family is at the bedside.  Overall he feels his condition is stable.    OBJECTIVE Temp:  [98.1 F (36.7 C)-98.7 F (37.1 C)] 98.6 F (37 C) (02/26 0752) Pulse Rate:  [71-98] 83 (02/26 0800) Cardiac Rhythm:  [-] Normal sinus rhythm (02/26 0800) Resp:  [10-31] 17 (02/26 0500) BP: (122-163)/(70-99) 150/99 mmHg (02/26 0800) SpO2:  [93 %-98 %] 97 % (02/26 0800) Weight:  [136.6 kg (301 lb 2.4 oz)] 136.6 kg (301 lb 2.4 oz) (02/25 2000)   Recent Labs Lab 08/31/14 1831 08/31/14 2121  GLUCAP 456* 329*   Labs done at [place]:  Platelets 139  No results for input(s): NA, K, CL, CO2, GLUCOSE, BUN, CREATININE, CALCIUM, MG, PHOS in the last 168 hours. No results for input(s): AST, ALT, ALKPHOS, BILITOT, PROT, ALBUMIN in the last 168 hours. No results for input(s): WBC, NEUTROABS, HGB, HCT, MCV, PLT in the last 168 hours. No results for input(s): CKTOTAL, CKMB, CKMBINDEX, TROPONINI in the last 168 hours. No results for input(s): LABPROT, INR in the last 72 hours. No  results for input(s): COLORURINE, LABSPEC, [place], GLUCOSEU, HGBUR, BILIRUBINUR, KETONESUR, PROTEINUR, UROBILINOGEN, NITRITE, LEUKOCYTESUR in the last 72 hours.  Invalid input(s): APPERANCEUR  No results found for: CHOL, TRIG, HDL, CHOLHDL, VLDL, LDLCALC No results found for: HGBA1C No results found for: LABOPIA, COCAINSCRNUR, LABBENZ, AMPHETMU, THCU, LABBARB  No results for input(s): ETH in the last 168 hours.  [name]  09/01/2014   CLINICAL DATA:  Intercerebral hemorrhage. Left-sided tingling, drooling, and involuntary tongue movements. Patient hit top of head on truck at work ; no loss of consciousness but positive soft tissue hematoma.  EXAM: MRI HEAD WITHOUT CONTRAST  MRA HEAD WITHOUT CONTRAST  TECHNIQUE: Multiplanar, multiecho pulse sequences of the brain and surrounding structures were obtained without intravenous contrast. Angiographic images of the head were obtained using MRA technique without contrast.  COMPARISON:  Head CT 08/31/2014  FINDINGS: MRI HEAD FINDINGS  There is no acute infarct, midline shift, or extra-axial fluid collection. As seen on recent CT, there is a 1.3 cm focus of hemorrhage in the right parietal operculum which demonstrates peripheral T1 hyperintensity and diffuse but mildly heterogeneous T2 hyperintensity with associated susceptibility artifact including suggestion of a thin peripheral rim of susceptibility. There is also suggestion of a small amount of perilesional T1 hyperintensity. There is at most minimal surrounding edema.  Mildly dilated perivascular spaces versus punctate lacunar infarcts are noted posteriorly in the right lentiform nucleus. Ventricles and sulci are normal for age.  No significant white matter disease is present.  Orbits are unremarkable. Paranasal sinuses and mastoid air cells are clear. Major intracranial vascular flow voids are preserved.  MRA HEAD FINDINGS  Images are moderately degraded by motion artifact. Visualized distal  vertebral arteries are patent with the left being dominant and without significant stenosis. PICA and SCA origins are grossly patent. There is apparent narrowing of the proximal left greater than right SCA is, however evaluation is limited by motion artifact through this region. Basilar artery is patent without evidence of significant stenosis. PCAs are patent without significant stenosis. Posterior communicating arteries are not clearly identified.  Internal carotid arteries are patent from skullbase to carotid termini without evidence of significant stenosis, although the cavernous segments are suboptimally evaluated due to motion artifact. M1 segments are patent without stenosis. All major MCA branch vessels are grossly patent, with evaluation for stenosis limited by motion. ACAs are patent without evidence of significant proximal stenosis. No sizable intracranial aneurysm is identified.  IMPRESSION: 1. 1.3 cm focus of subacute hemorrhage in the right parietal operculum with only trace surrounding edema. This is favored to reflect recent hemorrhage in a cavernoma. Posttraumatic hemorrhagic contusion is possible. Underlying mass/ neoplasm is felt unlikely but not completely excluded, and postcontrast imaging is recommended. 2. No acute infarct. 3. Moderately motion degraded MRA without major intracranial arterial occlusion or significant proximal stenosis identified.   Electronically Signed   By: Logan Bores   On: 09/01/2014 07:52   [name]  09/01/2014   CLINICAL DATA:  Intercerebral hemorrhage. Left-sided tingling, drooling, and involuntary tongue movements. Patient hit top of head on truck at work ; no loss of consciousness but positive soft tissue hematoma.  EXAM: MRI HEAD WITHOUT CONTRAST  MRA HEAD WITHOUT CONTRAST  TECHNIQUE: Multiplanar, multiecho pulse sequences of the brain and surrounding structures were obtained without intravenous contrast. Angiographic images of the head were obtained  using MRA technique without contrast.  COMPARISON:  Head CT 08/31/2014  FINDINGS: MRI HEAD FINDINGS  There is no acute infarct, midline shift, or extra-axial fluid collection. As seen on recent CT, there is a 1.3 cm focus of hemorrhage in the right parietal operculum which demonstrates peripheral T1 hyperintensity and diffuse but mildly heterogeneous T2 hyperintensity with associated susceptibility artifact including suggestion of a thin peripheral rim of susceptibility. There is also suggestion of a small amount of perilesional T1 hyperintensity. There is at most minimal surrounding edema.  Mildly dilated perivascular spaces versus punctate lacunar infarcts are noted posteriorly in the right lentiform nucleus. Ventricles and sulci are normal for age. No significant white matter disease is present.  Orbits are unremarkable. Paranasal sinuses and mastoid air cells are clear. Major intracranial vascular flow voids are preserved.  MRA HEAD FINDINGS  Images are moderately degraded by motion artifact. Visualized distal vertebral arteries are patent with the left being dominant and without significant stenosis. PICA and SCA origins are grossly patent. There is apparent narrowing of the proximal left greater than right SCA is, however evaluation is limited by motion artifact through this region. Basilar artery is patent without evidence of significant stenosis. PCAs are patent without significant stenosis. Posterior communicating arteries are not clearly identified.  Internal carotid arteries are patent from skullbase to carotid termini without evidence of significant stenosis, although the cavernous segments are suboptimally evaluated due to motion artifact. M1 segments are patent without stenosis. All major MCA branch vessels are grossly patent, with evaluation for stenosis limited by motion. ACAs are patent without  evidence of significant proximal stenosis. No sizable intracranial aneurysm is identified.  IMPRESSION: 1.  1.3 cm focus of subacute hemorrhage in the right parietal operculum with only trace surrounding edema. This is favored to reflect recent hemorrhage in a cavernoma. Posttraumatic hemorrhagic contusion is possible. Underlying mass/ neoplasm is felt unlikely but not completely excluded, and postcontrast imaging is recommended. 2. No acute infarct. 3. Moderately motion degraded MRA without major intracranial arterial occlusion or significant proximal stenosis identified.   Electronically Signed   By: Logan Bores   On: 09/01/2014 07:52     PHYSICAL EXAM Obese young Caucasian male not in distress. . Afebrile. Head is nontraumatic. Neck is supple without bruit.    Cardiac exam no murmur or gallop. Lungs are clear to auscultation. Distal pulses are well felt. Neurological Exam : Awake alert oriented x 3 normal speech and language. No face asymmetry. Tongue midline. No drift. Mild diminished fine finger movements on left. Orbits right over left upper extremity. Mild left grip weak.. Normal sensation . Normal coordination. ASSESSMENT/PLAN Mr. Daniel Ritter is a 39 y.o. male with history of diet controlled diabetes and hypertension presenting with left sided twitching. He did not receive IV t-PA due to hemorrhage.   Stroke:  right parietal surface hemorrhage, etiology unclear. Not a typical hemorrhage  Resultant  Neuro deficits resolved  MRI  R parietal surface hemorrhage  MRA  No significant stenosis   Carotid Doppler  See angio  Cerebral angio today to rule out source of hemorrhage  Vasculitic labs ordered  UDS ordered  SCDs for VTE prophylaxis  Diet Carb Modified thin liquids-> NPO now for angio  no antithrombotic prior to admission  Therapy recommendations:  None anticipated, therapy evals pending.ok to be OOB   Disposition:  Anticipate return home  Seizure, focal, new onset  Started on keppra  Hypertension  Elevated in hospital.  No Home meds  SBP goal < 160  Diabetes,  diet controlled  Taken off meds in 2013 due to diet controlled  HgbA1c pending, goal < 7.0  Other Stroke Risk Factors  Daily ETOH use  Obesity, Body mass index is 39.74 kg/(m^2).   Hospital day # [place] for Pager information 09/01/2014 9:23 AM  I have personally examined this patient, reviewed notes, independently viewed imaging studies, participated in medical decision making and plan of care. I have made any additions or clarifications directly to the above note. Agree with note above. He has a small right pareital cortical hemorrhage of undetermined etiology. Possibilities include cortical vein thrombosis, AVM, mycotic aneurysm, septic emboli or vasculitis. He remains at risk for neurological worsening, hematoma expansion and needs close monitoring and repeat imaging. Plan check catheter angiogram, urine drug screen and vasculitis labs. This patient is critically ill and at significant risk of neurological worsening, death and care requires constant monitoring of vital signs, hemodynamics,respiratory and cardiac monitoring,review of multiple databases, neurological assessment, discussion with family, other specialists and medical decision making of high complexity.I have made any additions or clarifications directly to the above note.  I spent 30 minutes of neurocritical care time  in the care of  this patient.  Antony Contras, MD Medical Director [facility] Pager: [phone] 09/01/2014 3:12 PM    To contact Stroke Continuity provider, please refer to [email or web address] After hours, contact General Neurology

## 2014-09-01 NOTE — Progress Notes (Signed)
Chaplain responded to spiritual care consult for advanced directive. Chaplain assisted in completion of healthcare power of attorney. Copy placed in patient chart. Page chaplain as needed.    09/01/14 1300  Clinical Encounter Type  Visited With Patient and family together  Visit Type Initial;Spiritual support  Referral From Nurse  Spiritual Encounters  Spiritual Needs Emotional  Stress Factors  Family Stress Factors Family relationships  Kapena Hamme, Epifanio Lesches 09/01/2014 1:38 PM

## 2014-09-01 NOTE — Progress Notes (Signed)
Pt states he and his wife are separated and in the middle of a divorce.  RN provided him with information on HCPOA.  Pt appointed his parents as his Alexander.  Verified and documented with Cristino Martes.  Copy placed in chart.

## 2014-09-02 DIAGNOSIS — F1010 Alcohol abuse, uncomplicated: Secondary | ICD-10-CM

## 2014-09-02 DIAGNOSIS — I619 Nontraumatic intracerebral hemorrhage, unspecified: Secondary | ICD-10-CM

## 2014-09-02 DIAGNOSIS — Z9114 Patient's other noncompliance with medication regimen: Secondary | ICD-10-CM

## 2014-09-02 DIAGNOSIS — E119 Type 2 diabetes mellitus without complications: Secondary | ICD-10-CM

## 2014-09-02 DIAGNOSIS — R569 Unspecified convulsions: Secondary | ICD-10-CM

## 2014-09-02 DIAGNOSIS — I10 Essential (primary) hypertension: Secondary | ICD-10-CM

## 2014-09-02 DIAGNOSIS — E1165 Type 2 diabetes mellitus with hyperglycemia: Secondary | ICD-10-CM

## 2014-09-02 LAB — GLUCOSE, CAPILLARY
Glucose-Capillary: 260 mg/dL — ABNORMAL HIGH (ref 70–99)
Glucose-Capillary: 220 mg/dL — ABNORMAL HIGH (ref 70–99)
Glucose-Capillary: 349 mg/dL — ABNORMAL HIGH (ref 70–99)
Glucose-Capillary: 269 mg/dL — ABNORMAL HIGH (ref 70–99)

## 2014-09-02 LAB — HEMOGLOBIN A1C
Hgb A1c MFr Bld: 11.5 % — ABNORMAL HIGH (ref 4.8–5.6)
Mean Plasma Glucose: 283 mg/dL

## 2014-09-02 LAB — RPR: RPR Ser Ql: NONREACTIVE

## 2014-09-02 LAB — C4 COMPLEMENT: Complement C4, Body Fluid: 39 mg/dL (ref 14–44)

## 2014-09-02 LAB — C3 COMPLEMENT: C3 Complement: 149 mg/dL (ref 82–167)

## 2014-09-02 LAB — HIV ANTIBODY (ROUTINE TESTING W REFLEX): HIV Screen 4th Generation wRfx: NONREACTIVE

## 2014-09-02 MED ORDER — LISINOPRIL 10 MG PO TABS: 10.0000 mg | ORAL_TABLET | Freq: Once | ORAL | Status: DC

## 2014-09-02 MED ORDER — METOPROLOL SUCCINATE ER 25 MG PO TB24
50.0000 mg | ORAL_TABLET | Freq: Every day | ORAL | Status: DC
  Administered 2014-09-03 – 2014-09-04 (×2): 50 mg via ORAL
  Filled 2014-09-02 (×2): qty 2

## 2014-09-02 MED ORDER — ZOLPIDEM TARTRATE 5 MG PO TABS
5.0000 mg | ORAL_TABLET | Freq: Every evening | ORAL | Status: DC | PRN
  Administered 2014-09-02 – 2014-09-03 (×2): 5 mg via ORAL
  Filled 2014-09-02 (×2): qty 1

## 2014-09-02 MED ORDER — METOPROLOL SUCCINATE ER 25 MG PO TB24
25.0000 mg | ORAL_TABLET | Freq: Every day | ORAL | Status: DC
  Administered 2014-09-02: 25 mg via ORAL
  Filled 2014-09-02: qty 1

## 2014-09-02 MED ORDER — LISINOPRIL 20 MG PO TABS
20.0000 mg | ORAL_TABLET | Freq: Every day | ORAL | Status: DC
  Administered 2014-09-02 – 2014-09-04 (×3): 20 mg via ORAL
  Filled 2014-09-02 (×2): qty 1

## 2014-09-02 MED ORDER — INSULIN GLARGINE 100 UNIT/ML ~~LOC~~ SOLN
25.0000 [IU] | Freq: Every day | SUBCUTANEOUS | Status: DC
  Administered 2014-09-02: 25 [IU] via SUBCUTANEOUS
  Filled 2014-09-02 (×2): qty 0.25

## 2014-09-02 NOTE — Evaluation (Signed)
Physical Therapy Evaluation and Discharge Patient Details Name: Daniel Ritter MRN: [id number] DOB: 05-22-1976 Today's Date: 09/02/2014   History of Present Illness  Adm with Lt sided weakness and numbness; MRI subacute hemorrhage in the right parietal operculum; witnessed seizures in ED  PMHx-DM, HTN, noncompliance, heavy ETOH use  Clinical Impression  Patient evaluated by Physical Therapy with no further acute PT needs identified. All education has been completed and the patient has no further questions.  See below for any follow-up Physial Therapy or equipment needs. PT is signing off. Thank you for this referral.     Follow Up Recommendations No PT follow up    Equipment Recommendations  None recommended by PT    Recommendations for Other Services       Precautions / Restrictions        Mobility  Bed Mobility Overal bed mobility: Independent                Transfers Overall transfer level: Independent Equipment used: None                Ambulation/Gait Ambulation/Gait assistance: Independent Ambulation Distance (Feet): 200 Feet Assistive device: None Gait Pattern/deviations: WFL(Within Functional Limits)   Gait velocity interpretation: at or above normal speed for age/gender    Stairs Stairs: Yes Stairs assistance: Independent Stair Management: No rails;Alternating pattern;Forwards Number of Stairs: 5    Wheelchair Mobility    Modified Rankin (Stroke Patients Only) Modified Rankin (Stroke Patients Only) Pre-Morbid Rankin Score: No symptoms Modified Rankin: No symptoms     Balance Overall balance assessment: Independent                               Standardized Balance Assessment Standardized Balance Assessment : Dynamic Gait Index;Berg Balance Test Berg Balance Test Sit to Stand: Able to stand without using hands and stabilize independently Standing Unsupported: Able to stand safely 2 minutes Sitting with Back  Unsupported but Feet Supported on Floor or Stool: Able to sit safely and securely 2 minutes Stand to Sit: Sits safely with minimal use of hands Transfers: Able to transfer safely, minor use of hands Standing Unsupported with Eyes Closed: Able to stand 10 seconds safely Standing Ubsupported with Feet Together: Able to place feet together independently and stand 1 minute safely From Standing, Reach Forward with Outstretched Arm: Can reach confidently >25 cm (10") From Standing Position, Pick up Object from Floor: Able to pick up shoe safely and easily From Standing Position, Turn to Look Behind Over each Shoulder: Looks behind from both sides and weight shifts well Standing Unsupported, Alternately Place Feet on Step/Stool: Able to stand independently and safely and complete 8 steps in 20 seconds Dynamic Gait Index Level Surface: Normal Change in Gait Speed: Normal Gait with Horizontal Head Turns: Normal Gait with Vertical Head Turns: Normal Gait and Pivot Turn: Normal Step Over Obstacle: Normal Step Around Obstacles: Normal Steps: Normal Total Score: 24       Pertinent Vitals/Pain Pain Assessment: No/denies pain    Home Living Family/patient expects to be discharged to:: Private residence Living Arrangements: Parent Available Help at Discharge: Family                  Prior Function Level of Independence: Independent               Hand Dominance   Dominant Hand: Right    Extremity/Trunk Assessment   Upper Extremity Assessment: Defer  to OT evaluation;Overall WFL for tasks assessed           Lower Extremity Assessment: Overall WFL for tasks assessed      Cervical / Trunk Assessment: Normal  Communication   Communication: No difficulties  Cognition Arousal/Alertness: Awake/alert Behavior During Therapy: WFL for tasks assessed/performed Overall Cognitive Status: Within Functional Limits for tasks assessed                      General Comments  General comments (skin integrity, edema, etc.): Discussed importance of controlling DM and HTN.    Exercises        Assessment/Plan    PT Assessment Patent does not need any further PT services  PT Diagnosis Difficulty walking   PT Problem List    PT Treatment Interventions     PT Goals (Current goals can be found in the Care Plan section) Acute Rehab PT Goals PT Goal Formulation: All assessment and education complete, DC therapy    Frequency     Barriers to discharge        Co-evaluation               End of Session   Activity Tolerance: Patient tolerated treatment well Patient left: in chair;with call bell/phone within reach           Time: 5465-6812 PT Time Calculation (min) (ACUTE ONLY): 12 min   Charges:   PT Evaluation $Initial PT Evaluation Tier I: 1 Procedure     PT G Codes:        Daniel Ritter 09/06/2014, 5:10 PM Pager [phone]

## 2014-09-02 NOTE — Progress Notes (Signed)
Pt arrived to 4N03 from 33M. Pt alert and oriented x 4. Pt reports no pain. Vitals stable. Pt oriented to room and call bell.

## 2014-09-02 NOTE — Progress Notes (Signed)
STROKE TEAM PROGRESS NOTE   HISTORY  Daniel Ritter is a 39 y.o. male who reports that he awakened this morning with left sided tingling, drooling and involuntary movements of his tongue. He went to work and had two repeat episodes and was noted by co-workers to "zone out" Patient reports that he remembers everything but was unable to control things at times. Reports that for the past week he has had episodes where he will have tingling in his left arm and the left side of his head including his scalp. These episodes have been short-lived and resolved spontaneously.  Patient reports hitting his head on his truck at work on the top of his head. There was no loss of consciousness but he did develop a hematoma. He was last known well 08/30/2014 at 23:30. Patient was not administered TPA secondary to [place]. ICH Score: 0. He was admitted to the neuro ICU for further evaluation and treatment.   SUBJECTIVE (INTERVAL HISTORY) Multiple family members present. The patient lives with his father. The patient's father is concerned about being able to "handle" the patient at home. The patient states that he has severe anxiety and is unable to sleep.   The patient has been NPO for a TEE, although if TEE is are not generally performed on the weekends. I spoke with the patient's nurse and she called endo - there is no TEE scheduled for today. I do not find an order for a TEE. A diet was ordered. The patient feels he is back to baseline.   OBJECTIVE Temp:  [97.9 F (36.6 C)-98.6 F (37 C)] 97.9 F (36.6 C) (02/27 0903) Pulse Rate:  [77-98] 93 (02/27 0903) Cardiac Rhythm:  [-] Normal sinus rhythm (02/27 0800) Resp:  [13-53] 20 (02/27 0903) BP: (116-188)/(55-104) 120/55 mmHg (02/27 0903) SpO2:  [95 %-100 %] 97 % (02/27 0903) FiO2 (%):  [2 %] 2 % (02/26 1556)   Recent Labs Lab 09/01/14 1132 09/01/14 1821 09/01/14 2158 09/02/14 0721 09/02/14 1123  GLUCAP 398* 291* 332* 260* 220*   Labs done at  [place]:  Platelets 139   Recent Labs Lab 09/01/14 1258  NA 133*  K 3.6  CL 103  CO2 24  GLUCOSE 306*  BUN 11  CREATININE 0.80  CALCIUM 8.8   No results for input(s): AST, ALT, ALKPHOS, BILITOT, PROT, ALBUMIN in the last 168 hours.  Recent Labs Lab 09/01/14 1258  WBC 7.0  HGB 13.9  HCT 39.6  MCV 85.5  PLT 140*   No results for input(s): CKTOTAL, CKMB, CKMBINDEX, TROPONINI in the last 168 hours.  Recent Labs  09/01/14 1258  LABPROT 13.4  INR 1.01   No results for input(s): COLORURINE, LABSPEC, PHURINE, GLUCOSEU, HGBUR, BILIRUBINUR, KETONESUR, PROTEINUR, UROBILINOGEN, NITRITE, LEUKOCYTESUR in the last 72 hours.  Invalid input(s): APPERANCEUR  No results found for: CHOL, TRIG, HDL, CHOLHDL, VLDL, LDLCALC Lab Results  Component Value Date   HGBA1C 11.5* 08/31/2014      Component Value Date/Time   LABOPIA NONE DETECTED 09/01/2014 1900   COCAINSCRNUR NONE DETECTED 09/01/2014 1900   LABBENZ POSITIVE* 09/01/2014 1900   AMPHETMU NONE DETECTED 09/01/2014 1900   THCU NONE DETECTED 09/01/2014 1900   LABBARB NONE DETECTED 09/01/2014 1900    No results for input(s): ETH in the last 168 hours.  [name] 09/01/2014    1. 1.3 cm focus of subacute hemorrhage in the right parietal operculum with only trace surrounding edema. This is favored to reflect recent hemorrhage  in a cavernoma. Posttraumatic hemorrhagic contusion is possible. Underlying mass/ neoplasm is felt unlikely but not completely excluded, and postcontrast imaging is recommended.  2. No acute infarct.  3. Moderately motion degraded MRA without major intracranial arterial occlusion or significant proximal stenosis identified.     4 vessel cerebral arteriogram RT CFA aprroach. Findings. No occlusions,stenosis,dissections ,AVshunting or aneurysms seen. Venous outflow WNLs   PHYSICAL EXAM Obese young Caucasian male not in distress. . Afebrile. Head is nontraumatic. Neck is supple without bruit.     Cardiac exam no murmur or gallop. Lungs are clear to auscultation. Distal pulses are well felt. Neurological Exam : Awake alert oriented x 3 normal speech and language. No face asymmetry. Tongue midline. No drift. Mild diminished fine finger movements on left. Orbits right over left upper extremity. Mild left grip weak.. Normal sensation . Normal coordination.    ASSESSMENT/PLAN Mr. Daniel Ritter is a 39 y.o. male with history of diet controlled diabetes and hypertension presenting with left sided twitching. He did not receive IV t-PA due to hemorrhage.   Stroke:  right parietal surface hemorrhage, etiology unclear. Not a typical hemorrhage  Resultant  Neuro deficits resolved  MRI  R parietal surface hemorrhage  MRA  No significant stenosis   Carotid Doppler  See angio  Cerebral angio Friday - normal  Vasculitic labs ordered - WNL so far except sed rate 17  UDS - positive for benzodiazepines  TEE reportedly ordered - I do not see an order. Not sure there is an indication.  SCDs and [place] Heparin for VTE prophylaxis  Diet heart healthy/carb modified thin liquids  no antithrombotic prior to admission  Therapy recommendations:  Pending  Disposition:  Anticipate return home  Seizure, focal, new onset  Started on keppra  Hypertension  Lisinopril/hydrochlorothiazide 20/12.5 mg daily and metoprolol XL 100 mg daily prior to admission   Will resume lisinopril and metoprolol at lower doses for now.  SBP goal < 160  Diabetes, diet controlled  Taken off meds in 2013 due to diet controlled  HgbA1c 11.5, goal < 7.0  Other Stroke Risk Factors  Daily ETOH use  Obesity, Body mass index is 39.74 kg/(m^2).   Other Issues  Medical noncompliance. The patient's sister reports the patient was not taking his blood pressure  medications consistently prior to admission.  Diabetes poorly controlled - per Dr. Irish Elders - request medical consult  Anxiety / insomnia  Hospital  day # 2  Lambert Keto, [place] for Pager information 09/02/2014 1:58 PM           To contact Stroke Continuity provider, please refer to [email or web address] After hours, contact General Neurology

## 2014-09-02 NOTE — Consult Note (Signed)
Requesting physician: Neurology  Reason for consultation: Management of ongoing medical problems ( HTN, DM, etoh abuse )  History of Present Illness: 39 year old obese male with history of hypertension and diet-controlled diabetes, noncompliant with his hypertensive medications, insomnia, alcohol abuse who was admitted to neurology/ stroke service for acute left-sided tingling, drooling and involuntary movements of his tongue. Patient on admission found to have a right parietal surface hemorrhage with no clear etiology. Did not receive tPA due to hemorrhage. Patient admitted to neurology service on 2/25. On 2/26 he had 3 episodes of focal seizures and was started on Keppra. Patient being worked up for hemorrhagic stroke. MRA of the brain showed no significant stenosis. Therefore angiogram was unremarkable. Vasculitis labs were normal. TEE planned for 2/29. Hospitalist consulted for elevated blood pressure and elevated blood glucose of 306 this morning.  Patient was being diagnosed of diabetes or 3 years back and insulin for about a year and was taken off it as his sugars were well controlled. He goes to a city doctor  in [place] will checks his ?A1C every 3 months and last reported was 5.7 about 3 months back. He is on 2 different blood pressure medication but is noncompliant. Is also nonadherent to his diet. He drinks 3 large beer daily along with 3-4 glass of wine. Denies any hx of withdrawal symptoms. He denies smoking or illicit drug use. He reports that he has continued to drink as he is unable to fall asleep during the night. Denies headache, blurred vision, dizziness, nausea, vomiting, chest pain, palpitations, shortness of breath, abdominal pain, bowel or urinary symptoms.    Allergies:  No Known Allergies  Past medical history Hypertension Diet-controlled diabetes mellitus Obesity Alcohol abuse Anxiety Insomnia  Past surgical history None  Medications:  Scheduled Meds: .  folic acid  1 mg Oral Daily  . hydrALAZINE  5 mg Intravenous UD  . insulin aspart  0-15 Units Subcutaneous TID WC  . insulin aspart  0-5 Units Subcutaneous QHS  . levETIRAcetam  500 mg Oral BID  . [START ON 09/03/2014] lisinopril  10 mg Oral Once  . LORazepam  0-4 mg Intravenous Q6H   Followed by  . LORazepam  0-4 mg Intravenous Q12H  . metoprolol succinate  25 mg Oral Daily  . multivitamin with minerals  1 tablet Oral Daily  . pantoprazole (PROTONIX) IV  40 mg Intravenous QHS  . senna-docusate  1 tablet Oral BID  . thiamine  100 mg Oral Daily   Or  . thiamine  100 mg Intravenous Daily    Social History: Drinks about 3 large cans of beer every day and 3-4 glass of wine, no history of smoking or illicit drug use  Family history Father has coronary artery disease Mother has hypertension  Review of Systems:  Constitutional: Denies fever, chills, diaphoresis, appetite change and fatigue.  HEENT: Denies visual or hearing symptoms, congestion, sore throat,  trouble swallowing, neck pain, neck stiffness and tinnitus.   Respiratory: Denies SOB, DOE, cough, chest tightness,  and wheezing.   Cardiovascular: Denies chest pain, palpitations and leg swelling.  Gastrointestinal: Denies nausea, vomiting, abdominal pain, diarrhea, constipation, blood in stool and abdominal distention.  Genitourinary: Denies dysuria,  hematuria, flank pain and difficulty urinating.  Endocrine: Denies: hot or cold intolerance, polyuria, polydipsia. Musculoskeletal: Denies myalgias, back pain, joint pain or swelling Skin: Denies  rash and wound.  Neurological: Seizures, numbness, Denies dizziness, syncope, weakness, light-headedness,  and headaches.  Hematological: Denies adenopathy. Psychiatric/Behavioral: Denies  mood  changes, confusion, nervousness, sleep disturbance    Physical Exam:  Filed Vitals:   09/02/14 0000 09/02/14 0042 09/02/14 0903 09/02/14 1415  BP: 116/74 143/96 120/55 145/67  Pulse: 93 98 93  100  Temp: 98.4 F (36.9 C) 98.6 F (37 C) 97.9 F (36.6 C) 98.1 F (36.7 C)  TempSrc: Oral Oral Oral Oral  Resp:  16 20 20   Height:      Weight:      SpO2: 98% 98% 97% 98%     Intake/Output Summary (Last 24 hours) at 09/02/14 1520 Last data filed at 09/02/14 1416  Gross per 24 hour  Intake    465 ml  Output    975 ml  Net   -510 ml    General: Middle aged obese male lying in bed in no acute distress HEENT: No pallor, no icterus, moist oral mucosa, neck supple Chest: Clear to auscultation bilaterally, no added sounds CVS: Normal S1 and S2, no murmurs rub or gallop GI: Soft, nondistended, nontender, bowel sounds present Musculoskeletal: Warm, no edema CNS: Alert and oriented, nonfocal   Labs on Admission:  CBC:    Component Value Date/Time   WBC 7.0 09/01/2014 1258   HGB 13.9 09/01/2014 1258   HCT 39.6 09/01/2014 1258   PLT 140* 09/01/2014 1258   MCV 85.5 09/01/2014 9892    Basic Metabolic Panel:    Component Value Date/Time   NA 133* 09/01/2014 1258   K 3.6 09/01/2014 1258   CL 103 09/01/2014 1258   CO2 24 09/01/2014 1258   BUN 11 09/01/2014 1258   CREATININE 0.80 09/01/2014 1258   GLUCOSE 306* 09/01/2014 1258   CALCIUM 8.8 09/01/2014 1258    Radiological Exams on Admission: [name]  09/01/2014   CLINICAL DATA:  Intercerebral hemorrhage. Left-sided tingling, drooling, and involuntary tongue movements. Patient hit top of head on truck at work ; no loss of consciousness but positive soft tissue hematoma.  EXAM: MRI HEAD WITHOUT CONTRAST  MRA HEAD WITHOUT CONTRAST  TECHNIQUE: Multiplanar, multiecho pulse sequences of the brain and surrounding structures were obtained without intravenous contrast. Angiographic images of the head were obtained using MRA technique without contrast.  COMPARISON:  Head CT 08/31/2014  FINDINGS: MRI HEAD FINDINGS  There is no acute infarct, midline shift, or extra-axial fluid collection. As seen on recent CT, there is a  1.3 cm focus of hemorrhage in the right parietal operculum which demonstrates peripheral T1 hyperintensity and diffuse but mildly heterogeneous T2 hyperintensity with associated susceptibility artifact including suggestion of a thin peripheral rim of susceptibility. There is also suggestion of a small amount of perilesional T1 hyperintensity. There is at most minimal surrounding edema.  Mildly dilated perivascular spaces versus punctate lacunar infarcts are noted posteriorly in the right lentiform nucleus. Ventricles and sulci are normal for age. No significant white matter disease is present.  Orbits are unremarkable. Paranasal sinuses and mastoid air cells are clear. Major intracranial vascular flow voids are preserved.  MRA HEAD FINDINGS  Images are moderately degraded by motion artifact. Visualized distal vertebral arteries are patent with the left being dominant and without significant stenosis. PICA and SCA origins are grossly patent. There is apparent narrowing of the proximal left greater than right SCA is, however evaluation is limited by motion artifact through this region. Basilar artery is patent without evidence of significant stenosis. PCAs are patent without significant stenosis. Posterior communicating arteries are not clearly identified.  Internal carotid arteries are patent from skullbase to  carotid termini without evidence of significant stenosis, although the cavernous segments are suboptimally evaluated due to motion artifact. M1 segments are patent without stenosis. All major MCA branch vessels are grossly patent, with evaluation for stenosis limited by motion. ACAs are patent without evidence of significant proximal stenosis. No sizable intracranial aneurysm is identified.  IMPRESSION: 1. 1.3 cm focus of subacute hemorrhage in the right parietal operculum with only trace surrounding edema. This is favored to reflect recent hemorrhage in a cavernoma. Posttraumatic hemorrhagic contusion is  possible. Underlying mass/ neoplasm is felt unlikely but not completely excluded, and postcontrast imaging is recommended. 2. No acute infarct. 3. Moderately motion degraded MRA without major intracranial arterial occlusion or significant proximal stenosis identified.   Electronically Signed   By: Logan Bores   On: 09/01/2014 07:52   [name]  09/01/2014   CLINICAL DATA:  Intercerebral hemorrhage. Left-sided tingling, drooling, and involuntary tongue movements. Patient hit top of head on truck at work ; no loss of consciousness but positive soft tissue hematoma.  EXAM: MRI HEAD WITHOUT CONTRAST  MRA HEAD WITHOUT CONTRAST  TECHNIQUE: Multiplanar, multiecho pulse sequences of the brain and surrounding structures were obtained without intravenous contrast. Angiographic images of the head were obtained using MRA technique without contrast.  COMPARISON:  Head CT 08/31/2014  FINDINGS: MRI HEAD FINDINGS  There is no acute infarct, midline shift, or extra-axial fluid collection. As seen on recent CT, there is a 1.3 cm focus of hemorrhage in the right parietal operculum which demonstrates peripheral T1 hyperintensity and diffuse but mildly heterogeneous T2 hyperintensity with associated susceptibility artifact including suggestion of a thin peripheral rim of susceptibility. There is also suggestion of a small amount of perilesional T1 hyperintensity. There is at most minimal surrounding edema.  Mildly dilated perivascular spaces versus punctate lacunar infarcts are noted posteriorly in the right lentiform nucleus. Ventricles and sulci are normal for age. No significant white matter disease is present.  Orbits are unremarkable. Paranasal sinuses and mastoid air cells are clear. Major intracranial vascular flow voids are preserved.  MRA HEAD FINDINGS  Images are moderately degraded by motion artifact. Visualized distal vertebral arteries are patent with the left being dominant and without significant stenosis.  PICA and SCA origins are grossly patent. There is apparent narrowing of the proximal left greater than right SCA is, however evaluation is limited by motion artifact through this region. Basilar artery is patent without evidence of significant stenosis. PCAs are patent without significant stenosis. Posterior communicating arteries are not clearly identified.  Internal carotid arteries are patent from skullbase to carotid termini without evidence of significant stenosis, although the cavernous segments are suboptimally evaluated due to motion artifact. M1 segments are patent without stenosis. All major MCA branch vessels are grossly patent, with evaluation for stenosis limited by motion. ACAs are patent without evidence of significant proximal stenosis. No sizable intracranial aneurysm is identified.  IMPRESSION: 1. 1.3 cm focus of subacute hemorrhage in the right parietal operculum with only trace surrounding edema. This is favored to reflect recent hemorrhage in a cavernoma. Posttraumatic hemorrhagic contusion is possible. Underlying mass/ neoplasm is felt unlikely but not completely excluded, and postcontrast imaging is recommended. 2. No acute infarct. 3. Moderately motion degraded MRA without major intracranial arterial occlusion or significant proximal stenosis identified.   Electronically Signed   By: Logan Bores   On: 09/01/2014 07:52    Assessment/recommendations:  Essential hypertension elevated blood pressure -I will resume his home dose lisinopril (20 mg a  day), and reintroduce  toprol to 50 mg daily ( on 100 mg XL daily) -Monitor H&H closely. Add when necessary hydralazine if blood pressure still elevated.  Uncontrolled type 2 diabetes mellitus Not on any medications at home and reportedly diet-controlled.( Patient not diet adherent). A1c is 11.5. I will add bedtime Lantus 25 units and titrate. He will need oral hypoglycemics along with insulin upon discharge. I will consult diabetic  coordinator. Monitor on sliding scale insulin.   Alcohol abuse Monitor on CIWA. Counseled strongly on alcohol cessation. Continue thiamine, folate and multivitamin.  Left parietal hemorrhagic stroke No clear etiology. Management per primary team. Plan on TEE on 2/29  Focal seizures Possibly triggered by stroke. On twice a day Keppra. Plan per primary team.   Insomnia  will order bedtime ambien   Diet:  Heart healthy/cardiac   Thank you for the consult. Hospitalist will continue to follow.  Time Spent on Admission: 70 minutes  Carri Spillers 09/02/2014, 3:20 PM   Pager: [phone]

## 2014-09-02 NOTE — Progress Notes (Signed)
Speech Language Pathology  Patient Details Name: Daniel Ritter MRN: [id number] DOB: [birth year] Today's Date: 09/02/2014 Time:  -     Pt screened. No speech-language-cognitive needs. Minimal dysarthria, speech is 100% intelligible. Educated pt briefly MN:[id number] to increase clarity.   [name] [place].[employer] [phone]

## 2014-09-02 NOTE — Progress Notes (Signed)
Report called to 4N charge nurse. All questions and concerns addressed. Pt updated on location change and ask if pt would like for nurse to call family. Pt states "I'll text my sister and tell my parents in the morning". All belongings packed and transported to room 4N03 with no issues.

## 2014-09-03 DIAGNOSIS — R569 Unspecified convulsions: Secondary | ICD-10-CM

## 2014-09-03 DIAGNOSIS — E119 Type 2 diabetes mellitus without complications: Secondary | ICD-10-CM

## 2014-09-03 LAB — GLUCOSE, CAPILLARY
Glucose-Capillary: 269 mg/dL — ABNORMAL HIGH (ref 70–99)
Glucose-Capillary: 344 mg/dL — ABNORMAL HIGH (ref 70–99)
Glucose-Capillary: 295 mg/dL — ABNORMAL HIGH (ref 70–99)
Glucose-Capillary: 310 mg/dL — ABNORMAL HIGH (ref 70–99)

## 2014-09-03 LAB — TSH: TSH: 1.511 u[IU]/mL (ref 0.350–4.500)

## 2014-09-03 LAB — COMPLEMENT, TOTAL: Compl, Total (CH50): >60 U/mL — ABNORMAL HIGH (ref 42–60)

## 2014-09-03 MED ORDER — INSULIN GLARGINE 100 UNIT/ML ~~LOC~~ SOLN
30.0000 [IU] | Freq: Every day | SUBCUTANEOUS | Status: DC
  Administered 2014-09-03: 30 [IU] via SUBCUTANEOUS
  Filled 2014-09-03: qty 0.3

## 2014-09-03 MED ORDER — ALPRAZOLAM 0.25 MG PO TABS: 0.2500 mg | ORAL_TABLET | Freq: Three times a day (TID) | ORAL | Status: DC | PRN

## 2014-09-03 NOTE — Progress Notes (Signed)
STROKE TEAM PROGRESS NOTE   HISTORY  Daniel Ritter is a 39 y.o. male who reports that he awakened this morning with left sided tingling, drooling and involuntary movements of his tongue. He went to work and had two repeat episodes and was noted by co-workers to "zone out" Patient reports that he remembers everything but was unable to control things at times. Reports that for the past week he has had episodes where he will have tingling in his left arm and the left side of his head including his scalp. These episodes have been short-lived and resolved spontaneously.  Patient reports hitting his head on his truck at work on the top of his head. There was no loss of consciousness but he did develop a hematoma. He was last known well 08/30/2014 at 23:30. Patient was not administered TPA secondary to [place]. ICH Score: 0. He was admitted to the neuro ICU for further evaluation and treatment.   SUBJECTIVE (INTERVAL HISTORY) Multiple family members present. The patient lives with his father. The patient's father is concerned about being able to "handle" the patient at home. The patient states that he has severe anxiety and is unable to sleep.   The patient has been NPO for a TEE, although if TEE is are not generally performed on the weekends. I spoke with the patient's nurse and she called endo - there is no TEE scheduled for today. I do not find an order for a TEE. A diet was ordered. The patient feels he is back to baseline.   OBJECTIVE There are no family members present today. The patient voices no complaints. He appreciates the medical consult and the advice he was given. He intends to quit drinking, start exercising, start eating right, lose weight, and be more health conscious.   Temp:  [97.9 F (36.6 C)-98.6 F (37 C)] 98.4 F (36.9 C) (02/28 0552) Pulse Rate:  [79-100] 79 (02/28 0552) Cardiac Rhythm:  [-] Normal sinus rhythm (02/27 2000) Resp:  [18-20] 18 (02/28 0552) BP:  (120-145)/(55-90) 126/73 mmHg (02/28 0552) SpO2:  [97 %-98 %] 97 % (02/28 0552)   Recent Labs Lab 09/02/14 0721 09/02/14 1123 09/02/14 1629 09/02/14 2112 09/03/14 0604  GLUCAP 260* 220* 349* 269* 269*   Labs done at [place]:  Platelets 139   Recent Labs Lab 09/01/14 1258  NA 133*  K 3.6  CL 103  CO2 24  GLUCOSE 306*  BUN 11  CREATININE 0.80  CALCIUM 8.8   No results for input(s): AST, ALT, ALKPHOS, BILITOT, PROT, ALBUMIN in the last 168 hours.  Recent Labs Lab 09/01/14 1258  WBC 7.0  HGB 13.9  HCT 39.6  MCV 85.5  PLT 140*   No results for input(s): CKTOTAL, CKMB, CKMBINDEX, TROPONINI in the last 168 hours.  Recent Labs  09/01/14 1258  LABPROT 13.4  INR 1.01   No results for input(s): COLORURINE, LABSPEC, PHURINE, GLUCOSEU, HGBUR, BILIRUBINUR, KETONESUR, PROTEINUR, UROBILINOGEN, NITRITE, LEUKOCYTESUR in the last 72 hours.  Invalid input(s): APPERANCEUR  No results found for: CHOL, TRIG, HDL, CHOLHDL, VLDL, LDLCALC Lab Results  Component Value Date   HGBA1C 11.5* 08/31/2014      Component Value Date/Time   LABOPIA NONE DETECTED 09/01/2014 1900   COCAINSCRNUR NONE DETECTED 09/01/2014 1900   LABBENZ POSITIVE* 09/01/2014 1900   AMPHETMU NONE DETECTED 09/01/2014 1900   THCU NONE DETECTED 09/01/2014 1900   LABBARB NONE DETECTED 09/01/2014 1900    No results for input(s): ETH in the last 168 hours.  [name] 09/01/2014    1. 1.3 cm focus of subacute hemorrhage in the right parietal operculum with only trace surrounding edema. This is favored to reflect recent hemorrhage in a cavernoma. Posttraumatic hemorrhagic contusion is possible. Underlying mass/ neoplasm is felt unlikely but not completely excluded, and postcontrast imaging is recommended.  2. No acute infarct.  3. Moderately motion degraded MRA without major intracranial arterial occlusion or significant proximal stenosis identified.     4 vessel cerebral arteriogram RT CFA  aprroach. Findings. No occlusions,stenosis,dissections ,AVshunting or aneurysms seen. Venous outflow WNLs   PHYSICAL EXAM Obese young Caucasian male not in distress. . Afebrile. Head is nontraumatic. Neck is supple without bruit.    Cardiac exam no murmur or gallop. Lungs are clear to auscultation. Distal pulses are well felt. Neurological Exam : Awake alert oriented x 3 normal speech and language. No face asymmetry. Tongue midline. No drift. Mild diminished fine finger movements on left. Orbits right over left upper extremity. Mild left grip weak.. Normal sensation . Normal coordination.    ASSESSMENT/PLAN Daniel Ritter is a 39 y.o. male with history of diet controlled diabetes and hypertension presenting with left sided twitching. He did not receive IV t-PA due to hemorrhage.   Stroke:  right parietal surface hemorrhage, etiology unclear. Not a typical hemorrhage  Resultant  Neuro deficits resolved  MRI  R parietal surface hemorrhage  MRA  No significant stenosis   Carotid Doppler  See angio  Cerebral angio Friday - normal  Vasculitic labs ordered - WNL except sed rate 17  UDS - positive for benzodiazepines  TEE reportedly ordered - per nursing note dated 09/01/2014. Discussed with Dr. Irish Elders. He does  not feel that a TEE is indicated but will defer to the stroke team on Monday.  SCDs and [place] Heparin for VTE prophylaxis  Diet heart healthy/carb modified thin liquids  no antithrombotic prior to admission. No antithrombotic's now secondary to bleed.  Therapy recommendations:  No follow-up PT or speech recommended.  Disposition:  Anticipate return home  Seizure, focal, new onset  Started on keppra  Hypertension  Lisinopril/hydrochlorothiazide 20/12.5 mg daily and metoprolol XL 100 mg daily prior to admission   Now on lisinopril 20 mg daily and metoprolol 50 mg daily. BP better but may need further  adjustments.  Consider adding HCTZ.  SBP goal <  160  Diabetes, diet controlled  Taken off meds in 2013 due to diet controlled  HgbA1c 11.5, goal < 7.0  Now on insulin per medical consult  Other Stroke Risk Factors  Daily ETOH use - cessation advised - CIWA  Obesity, Body mass index is 39.74 kg/(m^2).   Other Issues  Medical noncompliance. The patient's sister reports the patient was not taking his blood pressure  medications consistently prior to admission.  Diabetes poorly controlled - now on insulin  Anxiety / insomnia  Appreciate Medical consult from Dr Hope Pigeon - all pertinent issues are being addressed.   Hospital day # 3  RINEHULS, Mars Aristes for Pager information 09/03/2014 8:12 AM   I examined the patient along with reviewing images, laboratory work up and plan development.  Leotis Pain         To contact Stroke Continuity provider, please refer to [email or web address] After hours, contact General Neurology

## 2014-09-03 NOTE — Evaluation (Addendum)
Occupational Therapy Evaluation Patient Details Name: Daniel Ritter MRN: [id number] DOB: 21-Nov-1975 Today's Date: 09/03/2014    History of Present Illness Adm with Lt sided weakness and numbness; MRI subacute hemorrhage in the right parietal operculum; witnessed seizures in ED  PMHx-DM, HTN, noncompliance, heavy ETOH use   Clinical Impression   Pt admitted with above. Education provided to pt and family. Feel pt is safe to d/c home from OT standpoint. Pt says he has stroke handout.    Follow Up Recommendations  No OT follow up    Equipment Recommendations  None recommended by OT    Recommendations for Other Services       Precautions / Restrictions Restrictions Weight Bearing Restrictions: No      Mobility Bed Mobility               General bed mobility comments: not assessed  Transfers Overall transfer level: Independent Equipment used: None                  Balance  LOB when simulating LB bathing-suggested sitting for LB ADLs                                          ADL Overall ADL's : Needs assistance/impaired             Lower Body Bathing: Supervison/ safety (standing-lost balance but says he normally holds to somethin)           Toilet Transfer: Independent (chair)           Functional mobility during ADLs: Independent (took a few steps and pt reports showering today) General ADL Comments: Pt states his balance is not good at baseline-recommended sitting for LB ADLs. Educated on signs/symptoms of stroke and importance of getting help right away. Pt's father also in session and OT talked with him and if he notes something out of ordinary to call 911. Pt turning head at times with visual tracking test (says he was nervous)-suggested reading to help with tracking. Discussed gross motor activity he could do.     Vision Pt reports no change from baseline Vision Assessment?: Yes Tracking/Visual Pursuits:  (turning  head at times) Visual Fields: No apparent deficits   Perception     Praxis      Pertinent Vitals/Pain Pain Assessment: No/denies pain     Hand Dominance Right   Extremity/Trunk Assessment Upper Extremity Assessment Upper Extremity Assessment: LUE deficits/detail LUE Coordination: decreased gross motor (slightly)   Lower Extremity Assessment Lower Extremity Assessment: Overall WFL for tasks assessed       Communication Communication Communication: No difficulties   Cognition Arousal/Alertness: Awake/alert Behavior During Therapy: WFL for tasks assessed/performed Overall Cognitive Status: Within Functional Limits for tasks assessed                     General Comments       Exercises       Shoulder Instructions      Home Living Family/patient expects to be discharged to:: Private residence Living Arrangements: Parent Available Help at Discharge: Family               Bathroom Shower/Tub: Walk-in Psychologist, prison and probation services: Standard     Home Equipment: Shower seat - built in          Prior Functioning/Environment Level of Independence: Independent  OT Diagnosis: Other (comment) (decreased coordination)   OT Problem List:     OT Treatment/Interventions:      OT Goals(Current goals can be found in the care plan section)    OT Frequency:     Barriers to D/C:            Co-evaluation              End of Session    Activity Tolerance: Patient tolerated treatment well Patient left: in chair;with family/visitor present   Time: 1214-1229 OT Time Calculation (min): 15 min Charges:  OT General Charges $OT Visit: 1 Procedure OT Evaluation $Initial OT Evaluation Tier I: 1 Procedure G-CodesBenito Mccreedy OTR/L [id number] 09/03/2014, 12:44 PM

## 2014-09-03 NOTE — Progress Notes (Signed)
[facility] PROGRESS NOTE  Daniel Ritter LZJ:[id number] DOB: Dec 19, 1975 DOA: 08/31/2014 PCP: No PCP Per Patient  Assessment/Plan: 1. Hypertension- patient started on lisinopril 20 mg daily and Toprol-XL 50 mg daily. Blood pressure is controlled this morning 120/85. Continue the same regimen 2. Diabetes mellitus- patient was started on Lantus 25 units at bedtime, blood glucose is still elevated. Will increase the dose of Lantus to 30 units subcutaneous daily. Patient will benefit from starting metformin 500 mg by mouth twice a day at the time of discharge. 3. Anxiety- patient complains of generalized anxiety disorder, will continue patient on Ativan when necessary for anxiety. Patient will benefit from SSRI which I have discussed in detail with the patient and he will discuss with his PCP to start him on this medication as he can monitor the patient on SSRIs as outpatient. 4. Alcohol abuse- no signs and symptoms of withdrawal. Continue CIWA protocol. Continue thiamine folate multivitamin. 5. Left parietal Hemorrhagic stroke- management per stroke team 6. 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 Insomnia- continue when necessary Ambien  Code Status: Full code Family Communication: No family at bedside Disposition Plan: Home when stable     HPI/Subjective: 39 year old male with a history of hypertension, diet-controlled abdomen mellitus, noncompliance with medications, insomnia alcohol abuse who was admitted to neurology stroke service for acute left-sided tingling drooling and involuntary movement of the tongue. Patient was found to have) hemorrhage with no clear etiology patient was not given TPA due to the hemorrhage. He was started on Keppra for 3 episodes of focal seizures and 2 g was unremarkable, vascular to slabs were normal. Hospitalist service was consulted for hypertension as well as elevated blood glucose. Patient denies any complaints this morning  Objective: Filed  Vitals:   09/03/14 0854  BP: 120/85  Pulse: 89  Temp: 97.9 F (36.6 C)  Resp: 20    Intake/Output Summary (Last 24 hours) at 09/03/14 1032 Last data filed at 09/03/14 0854  Gross per 24 hour  Intake    480 ml  Output      0 ml  Net    480 ml   Filed Weights   08/31/14 2000  Weight: 136.6 kg (301 lb 2.4 oz)    Exam:   General:  Appears in no acute distress  Cardiovascular: S1-S2 regular in rate and rhythm no murmurs rubs auscultated.  Respiratory: Clear to auscultation bilaterally no adventitious sounds auscultated  Abdomen: Soft nontender no organomegaly  Musculoskeletal: No edema of the lower extremities   Data Reviewed: Basic Metabolic Panel:  Recent Labs Lab 09/01/14 1258  NA 133*  K 3.6  CL 103  CO2 24  GLUCOSE 306*  BUN 11  CREATININE 0.80  CALCIUM 8.8   Liver Function Tests: No results for input(s): AST, ALT, ALKPHOS, BILITOT, PROT, ALBUMIN in the last 168 hours. No results for input(s): LIPASE, AMYLASE in the last 168 hours. No results for input(s): AMMONIA in the last 168 hours. CBC:  Recent Labs Lab 09/01/14 1258  WBC 7.0  HGB 13.9  HCT 39.6  MCV 85.5  PLT 140*   Cardiac Enzymes: No results for input(s): CKTOTAL, CKMB, CKMBINDEX, TROPONINI in the last 168 hours. BNP (last 3 results) No results for input(s): BNP in the last 8760 hours.  ProBNP (last 3 results) No results for input(s): PROBNP in the last 8760 hours.  CBG:  Recent Labs Lab 09/02/14 0721 09/02/14 1123 09/02/14 1629 09/02/14 2112 09/03/14 0604  GLUCAP 260* 220* 349* 269* 269*  Recent Results (from the past 240 hour(s))  MRSA PCR Screening     Status: None   Collection Time: 08/31/14  6:15 PM  Result Value Ref Range Status   MRSA by PCR NEGATIVE NEGATIVE Final    Comment:        The GeneXpert MRSA Assay (FDA approved for NASAL specimens only), is one component of a comprehensive MRSA colonization surveillance program. It is not intended to diagnose  MRSA infection nor to guide or monitor treatment for MRSA infections.      Studies: No results found.  Scheduled Meds: . folic acid  1 mg Oral Daily  . hydrALAZINE  5 mg Intravenous UD  . insulin aspart  0-15 Units Subcutaneous TID WC  . insulin aspart  0-5 Units Subcutaneous QHS  . insulin glargine  30 Units Subcutaneous QHS  . levETIRAcetam  500 mg Oral BID  . lisinopril  20 mg Oral Daily  . LORazepam  0-4 mg Intravenous Q12H  . metoprolol succinate  50 mg Oral Daily  . multivitamin with minerals  1 tablet Oral Daily  . pantoprazole (PROTONIX) IV  40 mg Intravenous QHS  . senna-docusate  1 tablet Oral BID  . thiamine  100 mg Oral Daily   Or  . thiamine  100 mg Intravenous Daily   Continuous Infusions:   Active Problems:   ICH (intracerebral hemorrhage)   Hemorrhage   Cytotoxic brain edema   Uncontrolled hypertension   Type 2 diabetes mellitus not at goal   Noncompliance with medications   Convulsions/seizures   Alcohol abuse    Time spent: *20 min    [facility]  [facility] Pager [phone]*. If 7PM-7AM, please contact night-coverage at [email or web address], password [facility] 09/03/2014, 10:32 AM  LOS: 3 days

## 2014-09-04 LAB — BASIC METABOLIC PANEL
Anion gap: 8 (ref 5–15)
BUN: 10 mg/dL (ref 6–23)
CO2: 27 mmol/L (ref 19–32)
Calcium: 8.9 mg/dL (ref 8.4–10.5)
Chloride: 103 mmol/L (ref 96–112)
Creatinine, Ser: 0.73 mg/dL (ref 0.50–1.35)
GFR calc Af Amer: >90 mL/min (ref >90)
GFR calc non Af Amer: >90 mL/min (ref >90)
Glucose, Bld: 239 mg/dL — ABNORMAL HIGH (ref 70–99)
Potassium: 3.7 mmol/L (ref 3.5–5.1)
Sodium: 138 mmol/L (ref 135–145)

## 2014-09-04 LAB — LIPID PANEL
Cholesterol: 140 mg/dL (ref 0–200)
HDL: 24 mg/dL — ABNORMAL LOW (ref >39)
LDL Cholesterol: 85 mg/dL (ref 0–99)
Total CHOL/HDL Ratio: 5.8 RATIO
Triglycerides: 154 mg/dL — ABNORMAL HIGH (ref <150)
VLDL: 31 mg/dL (ref 0–40)

## 2014-09-04 LAB — CBC
HCT: 39.4 % (ref 39.0–52.0)
Hemoglobin: 13.4 g/dL (ref 13.0–17.0)
MCH: 29.6 pg (ref 26.0–34.0)
MCHC: 34 g/dL (ref 30.0–36.0)
MCV: 87 fL (ref 78.0–100.0)
Platelets: 134 10*3/uL — ABNORMAL LOW (ref 150–400)
RBC: 4.53 MIL/uL (ref 4.22–5.81)
RDW: 13.4 % (ref 11.5–15.5)
WBC: 7.2 10*3/uL (ref 4.0–10.5)

## 2014-09-04 LAB — GLUCOSE, CAPILLARY
Glucose-Capillary: 225 mg/dL — ABNORMAL HIGH (ref 70–99)
Glucose-Capillary: 178 mg/dL — ABNORMAL HIGH (ref 70–99)

## 2014-09-04 LAB — VITAMIN B12: Vitamin B-12: 416 pg/mL (ref 211–911)

## 2014-09-04 MED ORDER — HYDROCHLOROTHIAZIDE 12.5 MG PO CAPS: 12.5000 mg | ORAL_CAPSULE | Freq: Every day | ORAL | Status: DC

## 2014-09-04 MED ORDER — LISINOPRIL-HYDROCHLOROTHIAZIDE 20-12.5 MG PO TABS: 1.0000 | ORAL_TABLET | Freq: Every day | ORAL | Status: DC

## 2014-09-04 MED ORDER — METFORMIN HCL 500 MG PO TABS: 500.0000 mg | ORAL_TABLET | Freq: Two times a day (BID) | ORAL | Status: DC

## 2014-09-04 MED ORDER — INSULIN GLARGINE 100 UNIT/ML ~~LOC~~ SOLN: 35.0000 [IU] | Freq: Every day | SUBCUTANEOUS | Status: DC

## 2014-09-04 MED ORDER — ALPRAZOLAM 0.5 MG PO TABS: 0.5000 mg | ORAL_TABLET | Freq: Three times a day (TID) | ORAL | Status: DC | PRN

## 2014-09-04 MED ORDER — LISINOPRIL 20 MG PO TABS
20.0000 mg | ORAL_TABLET | Freq: Every day | ORAL | Status: DC
  Filled 2014-09-04: qty 1

## 2014-09-04 MED ORDER — FOLIC ACID 1 MG PO TABS: 1.0000 mg | ORAL_TABLET | Freq: Every day | ORAL | Status: DC

## 2014-09-04 MED ORDER — INSULIN GLARGINE 100 UNIT/ML ~~LOC~~ SOLN
35.0000 [IU] | Freq: Every day | SUBCUTANEOUS | Status: DC
  Filled 2014-09-04: qty 0.35

## 2014-09-04 MED ORDER — LEVETIRACETAM 500 MG PO TABS: 500.0000 mg | ORAL_TABLET | Freq: Two times a day (BID) | ORAL | Status: AC

## 2014-09-04 NOTE — Discharge Summary (Signed)
Stroke Discharge Summary  Patient ID: Daniel Ritter   MRN: [id number]      DOB: October 05, 1975  Date of Admission: 08/31/2014 Date of Discharge: 09/04/2014  Attending Physician:  Rosalin Hawking, MD, Stroke MD  Consulting Physician(s):      Louellen Molder, MD (Internal Medicine), Willaim Rayas Nicole Kindred) Deveshwar, MD (Interventional Neuroradiologist)  Patient's PCP:  No PCP Per Patient  DISCHARGE DIAGNOSIS:  Active Problems:   ICH (intracerebral hemorrhage),  right parietal surface hemorrhage, etiology unclear   Cytotoxic brain edema   Uncontrolled hypertension   Type 2 diabetes mellitus not at goal   Noncompliance with medications   Convulsions/seizures   Alcohol abuse  BMI: Body mass index is 39.74 kg/(m^2).  History reviewed. No pertinent past medical history. History reviewed. No pertinent past surgical history.    Medication List    TAKE these medications        folic acid 1 MG tablet  Commonly known as:  FOLVITE  Take 1 tablet (1 mg total) by mouth daily.     insulin glargine 100 UNIT/ML injection  Commonly known as:  LANTUS  Inject 0.35 mLs (35 Units total) into the skin at bedtime.     levETIRAcetam 500 MG tablet  Commonly known as:  KEPPRA  Take 1 tablet (500 mg total) by mouth 2 (two) times daily.     lisinopril-hydrochlorothiazide 20-12.5 MG per tablet  Commonly known as:  PRINZIDE,ZESTORETIC  Take 1 tablet by mouth daily.     metoprolol succinate 100 MG 24 hr tablet  Commonly known as:  TOPROL-XL  Take 100 mg by mouth at bedtime. Take with or immediately following a meal.        LABORATORY STUDIES CBC    Component Value Date/Time   WBC 7.2 09/04/2014 0615   RBC 4.53 09/04/2014 0615   HGB 13.4 09/04/2014 0615   HCT 39.4 09/04/2014 0615   PLT 134* 09/04/2014 0615   MCV 87.0 09/04/2014 0615   MCH 29.6 09/04/2014 0615   MCHC 34.0 09/04/2014 0615   RDW 13.4 09/04/2014 0615   CMP    Component Value Date/Time   NA 138 09/04/2014 0615   K 3.7 09/04/2014  0615   CL 103 09/04/2014 0615   CO2 27 09/04/2014 0615   GLUCOSE 239* 09/04/2014 0615   BUN 10 09/04/2014 0615   CREATININE 0.73 09/04/2014 0615   CALCIUM 8.9 09/04/2014 0615   GFRNONAA >90 09/04/2014 0615   GFRAA >90 09/04/2014 0615   COAGS Lab Results  Component Value Date   INR 1.01 09/01/2014   Lipid Panel    Component Value Date/Time   CHOL 140 09/04/2014 0615   TRIG 154* 09/04/2014 0615   HDL 24* 09/04/2014 0615   CHOLHDL 5.8 09/04/2014 0615   VLDL 31 09/04/2014 0615   LDLCALC 85 09/04/2014 0615   HgbA1C  Lab Results  Component Value Date   HGBA1C 11.5* 08/31/2014   Cardiac Panel (last 3 results) No results for input(s): CKTOTAL, CKMB, TROPONINI, RELINDX in the last 72 hours. Urinalysis No results found for: COLORURINE, APPEARANCEUR, LABSPEC, PHURINE, GLUCOSEU, HGBUR, BILIRUBINUR, KETONESUR, PROTEINUR, UROBILINOGEN, NITRITE, LEUKOCYTESUR Urine Drug Screen     Component Value Date/Time   LABOPIA NONE DETECTED 09/01/2014 1900   COCAINSCRNUR NONE DETECTED 09/01/2014 1900   LABBENZ POSITIVE* 09/01/2014 1900   AMPHETMU NONE DETECTED 09/01/2014 1900   THCU NONE DETECTED 09/01/2014 1900   LABBARB NONE DETECTED 09/01/2014 1900    Alcohol Level No results found for: [facility]  SIGNIFICANT DIAGNOSTIC STUDIES  [name] 09/01/2014    1. 1.3 cm focus of subacute hemorrhage in the right parietal operculum with only trace surrounding edema. This is favored to reflect recent hemorrhage in a cavernoma. Posttraumatic hemorrhagic contusion is possible. Underlying mass/ neoplasm is felt unlikely but not completely excluded, and postcontrast imaging is recommended. 2. No acute infarct.    [name] 09/01/2014    Moderately motion degraded MRA without major intracranial arterial occlusion or significant proximal stenosis identified.     Cerebral Arteriogram 09/01/2014  Angiographically no evidence of arteriovenous shunt to suggest a dural AV fistula,  arteriovenous malformation or an aneurysm. Venous outflow grossly normal without evidence angiographically of significant veno-occlusive disease. Approximately 50% narrowing of the right MCA superior division. This is a nonspecific appearance. This could be related to atherosclerotic disease, versus vasospasm versus vasculitis.      HISTORY OF PRESENT ILLNESS  Daniel Ritter is a 39 y.o. male who reports that he awakened this morning 08/30/2014 with left sided tingling, drooling and involuntary movements of his tongue. He went to work and had two repeat episodes and was noted by co-workers to "zone out" Patient reports that he remembers everything but was unable to control things at times. Reports that for the past week he has had episodes where he will have tingling in his left arm and the left side of his head including his scalp. These episodes have been short-lived and resolved spontaneously.  Patient reports hitting his head on his truck at work on the top of his head. There was no loss of consciousness but he did develop a hematoma. He was last known well 08/30/2014 at 23:30. Patient was not administered TPA secondary to [place]. ICH Score: 0. He was admitted to the neuro ICU for further evaluation and treatment.   HOSPITAL COURSE 39 yo M with hx of HTN and DM admitted for small right parietal hemorrhage. Symptom resolved. MRI MRA and cerebral angio negative for etiology. Concerning for small AVM? AVF? DVA? Not seen on angio. Will follow up in clinic and repeat imaging later. Control risk factors including HTN and DM. Continue keppra for simple partial seizure, will take off during follow up. establish PCP ASAP. Discharged in good condition today.  Stroke: right parietal surface hemorrhage, etiology unclear. Not a typical hemorrhage.   Resultant Neuro deficits resolved  MRI R parietal surface hemorrhage  MRA No significant stenosis   Cerebral angio no source of hemorrhage. Unlikely  to have underlying mass but cannot exclude  UDS - positive for benzodiazepines  No indication for TEE   no antithrombotic prior to admission. No antithrombotic's now secondary to bleed.  Therapy recommendations: None  Repeat CT head in a couple of months  Vasculitic labs ordered - WNL except sed rate 17. Follow up additional vasculitis labs (double strand DNA, lupus, etc) drawn day of discharge  Advised to get a primary care MD, parents agreeable  Disposition: Discharge home  Seizure, focal, new onset  R facial  Started on keppra  Hypertension  Lisinopril/hydrochlorothiazide 20/12.5 mg daily and metoprolol XL 100 mg daily prior to admission. Not taking as prescribed per sister  Home medications resumed at discharge  Long term SBP goal < 130/90  Diabetes  Previously diet controlled  Taken off meds in 2013 due to dietaty control  Now uncontrolled. HgbA1c 11.5, goal < 7.0  New insulin per medical consult  Other Stroke Risk Factors  Daily ETOH use - cessation  advised - CIWA  Obesity, Body mass index is 39.74 kg/(m^2).  Other Issues  Medical noncompliance.   Anxiety / insomnia    DISCHARGE EXAM Blood pressure 117/56, pulse 83, temperature 97.8 F (36.6 C), temperature source Oral, resp. rate 16, height 6\' 1"  (1.854 m), weight 136.6 kg (301 lb 2.4 oz), SpO2 97 %. Obese young Caucasian male not in distress. . Afebrile. Head is nontraumatic. Neck is supple without bruit. Cardiac exam no murmur or gallop. Lungs are clear to auscultation. Distal pulses are well felt. Neurological Exam : Awake alert oriented x 3 normal speech and language. No face asymmetry. Tongue midline. No drift. Mild diminished fine finger movements on left. Orbits right over left upper extremity. Mild left grip weak.. Normal sensation . Normal coordination.   Discharge Diet   Diet heart healthy/carb modified thin liquids  DISCHARGE PLAN  Disposition:  Home, lives with parents   To  get a primary care MD at discharge, Follow-up No PCP Per Patient in 1 month  Follow-up with Dr. Rosalin Hawking in 2 months Stroke Clinic   45 minutes were spent preparing discharge.  [facility] [place] for Pager information 09/04/2014 5:15 PM   I, the attending vascular neurologist, have personally obtained a history, examined the patient, evaluated laboratory data, individually viewed imaging studies and agree with radiology interpretations. I also discussed with pt's parents at bedside. Together with the NP/PA, we formulated the assessment and plan of care which reflects our mutual decision.  I have made any additions or clarifications directly to the above note and agree with the findings and plan as currently documented.   Rosalin Hawking, MD PhD Stroke Neurology 09/06/2014 3:12 PM

## 2014-09-04 NOTE — Progress Notes (Signed)
[facility] PROGRESS NOTE  Daniel Ritter WEX:[id number] DOB: 07/06/1976 DOA: 08/31/2014 PCP: No PCP Per Patient  Assessment/Plan: 1. Hypertension- patient started on lisinopril 20 mg daily and Toprol-XL 50 mg daily. Blood pressure is 140/94, will also add HCTZ 12.5 mg  to the above regimen.  At the time of discharge change the dose of Toprol XL to 50 mg po daily in the discharge medications. 2. Diabetes mellitus- patient was started on Lantus 25 units at bedtime, blood glucose is still elevated. Will increase the dose of Lantus to 35 units subcutaneous daily. Patient will benefit from starting metformin 500 mg by mouth twice a day at the time of discharge. I have added Metformin 500 mg po BID in the discharge orders. Can be discharged on lantus 35 units subcut at bedtime. 3. Anxiety- patient complains of generalized anxiety disorder, will continue patient on Ativan when necessary for anxiety. Patient will benefit from SSRI which I have discussed in detail with the patient and he will discuss with his PCP to start him on this medication as he can monitor the patient on SSRIs as outpatient. Will start Xanax 0.5 mg po TID prn 4. Alcohol abuse- no signs and symptoms of withdrawal. Continue CIWA protocol. Continue thiamine folate multivitamin. 5. Left parietal Hemorrhagic stroke- management per stroke team 6. 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 Insomnia- continue when necessary Ambien  Code Status: Full code Family Communication: No family at bedside Disposition Plan: Home when stable     HPI/Subjective: 38 year old male with a history of hypertension, diet-controlled abdomen mellitus, noncompliance with medications, insomnia alcohol abuse who was admitted to neurology stroke service for acute left-sided tingling drooling and involuntary movement of the tongue. Patient was found to have) hemorrhage with no clear etiology patient was not given TPA due to the hemorrhage. He was  started on Keppra for 3 episodes of focal seizures and 2 g was unremarkable, vascular to slabs were normal. Hospitalist service was consulted for hypertension as well as elevated blood glucose.  Patient complains of anxiety this morning.  Objective: Filed Vitals:   09/04/14 1032  BP: 140/94  Pulse: 91  Temp: 98.2 F (36.8 C)  Resp:     Intake/Output Summary (Last 24 hours) at 09/04/14 1045 Last data filed at 09/04/14 0900  Gross per 24 hour  Intake      0 ml  Output      0 ml  Net      0 ml   Filed Weights   08/31/14 2000  Weight: 136.6 kg (301 lb 2.4 oz)    Exam:   General:  Appears in no acute distress  Cardiovascular: S1-S2 regular in rate and rhythm no murmurs rubs auscultated.  Respiratory: Clear to auscultation bilaterally no adventitious sounds auscultated  Abdomen: Soft nontender no organomegaly  Musculoskeletal: No edema of the lower extremities   Data Reviewed: Basic Metabolic Panel:  Recent Labs Lab 09/01/14 1258 09/04/14 0615  NA 133* 138  K 3.6 3.7  CL 103 103  CO2 24 27  GLUCOSE 306* 239*  BUN 11 10  CREATININE 0.80 0.73  CALCIUM 8.8 8.9   Liver Function Tests: No results for input(s): AST, ALT, ALKPHOS, BILITOT, PROT, ALBUMIN in the last 168 hours. No results for input(s): LIPASE, AMYLASE in the last 168 hours. No results for input(s): AMMONIA in the last 168 hours. CBC:  Recent Labs Lab 09/01/14 1258 09/04/14 0615  WBC 7.0 7.2  HGB 13.9 13.4  HCT 39.6 39.4  MCV 85.5 87.0  PLT 140* 134*   Cardiac Enzymes: No results for input(s): CKTOTAL, CKMB, CKMBINDEX, TROPONINI in the last 168 hours. BNP (last 3 results) No results for input(s): BNP in the last 8760 hours.  ProBNP (last 3 results) No results for input(s): PROBNP in the last 8760 hours.  CBG:  Recent Labs Lab 09/03/14 0604 09/03/14 1105 09/03/14 1622 09/03/14 2117 09/04/14 0642  GLUCAP 269* 344* 295* 310* 225*    Recent Results (from the past 240 hour(s))   MRSA PCR Screening     Status: None   Collection Time: 08/31/14  6:15 PM  Result Value Ref Range Status   MRSA by PCR NEGATIVE NEGATIVE Final    Comment:        The GeneXpert MRSA Assay (FDA approved for NASAL specimens only), is one component of a comprehensive MRSA colonization surveillance program. It is not intended to diagnose MRSA infection nor to guide or monitor treatment for MRSA infections.      Studies: No results found.  Scheduled Meds: . folic acid  1 mg Oral Daily  . hydrALAZINE  5 mg Intravenous UD  . insulin aspart  0-15 Units Subcutaneous TID WC  . insulin aspart  0-5 Units Subcutaneous QHS  . insulin glargine  35 Units Subcutaneous QHS  . levETIRAcetam  500 mg Oral BID  . lisinopril  20 mg Oral Daily  . LORazepam  0-4 mg Intravenous Q12H  . metoprolol succinate  50 mg Oral Daily  . multivitamin with minerals  1 tablet Oral Daily  . pantoprazole (PROTONIX) IV  40 mg Intravenous QHS  . senna-docusate  1 tablet Oral BID  . thiamine  100 mg Oral Daily   Or  . thiamine  100 mg Intravenous Daily   Continuous Infusions:   Active Problems:   ICH (intracerebral hemorrhage)   Hemorrhage   Cytotoxic brain edema   Uncontrolled hypertension   Type 2 diabetes mellitus not at goal   Noncompliance with medications   Convulsions/seizures   Alcohol abuse    Time spent: *20 min    [facility]  [facility] Pager [phone]*. If 7PM-7AM, please contact night-coverage at [email or web address], password [facility] 09/04/2014, 10:45 AM  LOS: 4 days

## 2014-09-04 NOTE — Progress Notes (Signed)
STROKE TEAM PROGRESS NOTE   HISTORY DENNARD VEZINA is a 39 y.o. male who reports that he awakened this morning 08/30/2014 with left sided tingling, drooling and involuntary movements of his tongue. He went to work and had two repeat episodes and was noted by co-workers to "zone out" Patient reports that he remembers everything but was unable to control things at times. Reports that for the past week he has had episodes where he will have tingling in his left arm and the left side of his head including his scalp. These episodes have been short-lived and resolved spontaneously.  Patient reports hitting his head on his truck at work on the top of his head. There was no loss of consciousness but he did develop a hematoma. He was last known well 08/30/2014 at 23:30. Patient was not administered TPA secondary to [place]. ICH Score: 0. He was admitted to the neuro ICU for further evaluation and treatment.   SUBJECTIVE (INTERVAL HISTORY) His mother and father are at the bedside. Patient is up in the chair. No complains. Will established PCP care in [place] after discharge.   OBJECTIVE Temp:  [97.8 F (36.6 C)-98.2 F (36.8 C)] 97.8 F (36.6 C) (02/29 0458) Pulse Rate:  [68-92] 83 (02/29 0458) Cardiac Rhythm:  [-] Normal sinus rhythm (02/29 0812) Resp:  [16-20] 16 (02/29 0458) BP: (117-149)/(56-98) 117/56 mmHg (02/29 0458) SpO2:  [92 %-100 %] 97 % (02/29 0458)   Recent Labs Lab 09/03/14 0604 09/03/14 1105 09/03/14 1622 09/03/14 2117 09/04/14 0642  GLUCAP 269* 344* 295* 310* 225*    Recent Labs Lab 09/01/14 1258 09/04/14 0615  NA 133* 138  K 3.6 3.7  CL 103 103  CO2 24 27  GLUCOSE 306* 239*  BUN 11 10  CREATININE 0.80 0.73  CALCIUM 8.8 8.9   No results for input(s): AST, ALT, ALKPHOS, BILITOT, PROT, ALBUMIN in the last 168 hours.  Recent Labs Lab 09/01/14 1258 09/04/14 0615  WBC 7.0 7.2  HGB 13.9 13.4  HCT 39.6 39.4  MCV 85.5 87.0  PLT 140* 134*   No results for  input(s): CKTOTAL, CKMB, CKMBINDEX, TROPONINI in the last 168 hours.  Recent Labs  09/01/14 1258  LABPROT 13.4  INR 1.01   No results for input(s): COLORURINE, LABSPEC, PHURINE, GLUCOSEU, HGBUR, BILIRUBINUR, KETONESUR, PROTEINUR, UROBILINOGEN, NITRITE, LEUKOCYTESUR in the last 72 hours.  Invalid input(s): APPERANCEUR     Component Value Date/Time   CHOL 140 09/04/2014 0615   TRIG 154* 09/04/2014 0615   HDL 24* 09/04/2014 0615   CHOLHDL 5.8 09/04/2014 0615   VLDL 31 09/04/2014 0615   LDLCALC 85 09/04/2014 0615   Lab Results  Component Value Date   HGBA1C 11.5* 08/31/2014      Component Value Date/Time   LABOPIA NONE DETECTED 09/01/2014 1900   COCAINSCRNUR NONE DETECTED 09/01/2014 1900   LABBENZ POSITIVE* 09/01/2014 1900   AMPHETMU NONE DETECTED 09/01/2014 1900   THCU NONE DETECTED 09/01/2014 1900   LABBARB NONE DETECTED 09/01/2014 1900    No results for input(s): ETH in the last 168 hours.  [name] 09/01/2014    1. 1.3 cm focus of subacute hemorrhage in the right parietal operculum with only trace surrounding edema. This is favored to reflect recent hemorrhage in a cavernoma. Posttraumatic hemorrhagic contusion is possible. Underlying mass/ neoplasm is felt unlikely but not completely excluded, and postcontrast imaging is recommended.  2. No acute infarct.  3. Moderately motion degraded MRA without major intracranial arterial occlusion  or significant proximal stenosis identified.     cerebral arteriogram 09/01/2014 No occlusions,stenosis,dissections ,AVshunting or aneurysms seen. Venous outflow WNLs   PHYSICAL EXAM Obese young Caucasian male not in distress. . Afebrile. Head is nontraumatic. Neck is supple without bruit.    Cardiac exam no murmur or gallop. Lungs are clear to auscultation. Distal pulses are well felt. Neurological Exam : Awake alert oriented x 3 normal speech and language. No face asymmetry. Tongue midline. No drift. Mild diminished fine  finger movements on left. Orbits right over left upper extremity. Mild left grip weak.. Normal sensation . Normal coordination.  ASSESSMENT/PLAN Mr. KEWON STATLER is a 39 y.o. male with history of diet controlled diabetes and hypertension presenting with left sided twitching. He did not receive IV t-PA due to hemorrhage.   Small cortical hemorrhage:  right parietal surface small hemorrhage, etiology unclear. Not a typical hemorrhage. Cerebral angio negative. Small AVM? DVA? AVF?  Resultant  Neuro deficits resolved  MRI  R parietal surface hemorrhage  MRA  No significant stenosis   Cerebral angio Friday - normal  UDS - positive for benzodiazepines  No indication for TEE   SCDs and [place] Heparin for VTE prophylaxis  Diet heart healthy/carb modified thin liquids  no antithrombotic prior to admission. No antithrombotic's now secondary to bleed.  Therapy recommendations:  No follow-up PT, OT or speech recommended.  Repeat CT head in a couple of months  Follow up additional vasculitis labs (double strand DNA, lupus, etc) drawn this am  Advised to get a primary care MD, parents agreeable  Disposition:  Discharge home  Seizure, simple partial, new onset  L facial twitch  Started on keppra  Continue kepra  Hypertension  Lisinopril/hydrochlorothiazide 20/12.5 mg daily and metoprolol XL 100 mg daily prior to admission   Now on lisinopril 20 mg daily and metoprolol 50 mg daily.   Consider adding HCTZ.in the future  Long term SBP goal < 130/90  DM  Previously diet controlled  Taken off meds in 2013 due to dietaty control  Now uncontrolled. HgbA1c 11.5, goal < 7.0  New insulin per medical consult  Follow up with PCP as outpt  Other Stroke Risk Factors  Daily ETOH use - cessation advised - CIWA  Obesity, Body mass index is 39.74 kg/(m^2).   Other Issues  Medical noncompliance. The patient's sister reports the patient was not taking his blood pressure   medications consistently prior to admission.  Anxiety / insomnia  Hospital day # [place] for Pager information 09/04/2014 10:10 AM   I, the attending vascular neurologist, have personally obtained a history, examined the patient, evaluated laboratory data, individually viewed imaging studies and agree with radiology interpretations. I also obtained additional history from pt's parents at bedside. Together with the NP/PA, we formulated the assessment and plan of care which reflects our mutual decision.  I have made any additions or clarifications directly to the above note and agree with the findings and plan as currently documented.   39 yo M with hx of HTN and DM admitted for small right parietal hemorrhage. MRI MRA and cerebral angio negative for etiology. Concerning for small AVM? AVF? DVA? Not seen on angio. Will follow up in clinic and repeat imaging later. Control risk factors including HTN and DM. Continue keppra for simple partial seizure, will take off during follow up. establish PCP ASAP. Discharge today.  Rosalin Hawking, MD PhD Stroke Neurology 09/05/2014 6:10 AM  To contact Stroke Continuity provider, please refer to [email or web address] After hours, contact General Neurology

## 2014-09-05 LAB — ANCA TITERS
Atypical P-ANCA titer: <1:20 {titer}
C-ANCA: <1:20 {titer}
P-ANCA: <1:20 {titer}

## 2014-09-05 LAB — SJOGRENS SYNDROME-A EXTRACTABLE NUCLEAR ANTIBODY: SSA (Ro) (ENA) Antibody, IgG: <1

## 2014-09-05 LAB — RHEUMATOID FACTOR: Rhuematoid fact SerPl-aCnc: 10.2 IU/mL (ref 0.0–13.9)

## 2014-09-05 LAB — SJOGRENS SYNDROME-B EXTRACTABLE NUCLEAR ANTIBODY: SSB (La) (ENA) Antibody, IgG: <1

## 2014-09-05 LAB — ANTI-DNA ANTIBODY, DOUBLE-STRANDED: ds DNA Ab: <1 IU/mL

## 2014-10-29 NOTE — Discharge Summary (Signed)
PATIENT NAME:  Daniel Ritter, Daniel Ritter MR#:  [id number] DATE OF BIRTH:  [birth year]  DATE OF ADMISSION:  09/17/2011 DATE OF DISCHARGE:  09/20/2011  ADDENDUM  Patient was given aggressive potassium replacement on discharge. He received about 40 mEq of p.o. potassium and about 80 mg of IV potassium. Repeat potassium at around 8:30 was 3.3. He was given another 20 mEq of potassium p.o. and was given potassium for home also along with magnesium. Rapid HIV test was negative.   ____________________________ Mena Pauls, MD ag:cms D: 09/21/2011 07:39:22 ET T: 09/22/2011 11:48:01 ET JOB#: [id number]  cc: Mena Pauls, MD, <Dictator> [place] MD ELECTRONICALLY SIGNED 09/30/2011 15:11

## 2014-10-29 NOTE — Consult Note (Signed)
Chief Complaint and History:   Referring Physician Dr. Holley Raring    Chief Complaint New onset diabetes with DKA    History of Present Illness This is a 39 yo M with no significant past medical hx who presented to the ED with several weeks of lethargy, increased thirst, polturia and polydipsia, weight loss of 45 lbs since 06/2011, and N/V. He was found to have a sugar of >600, arterial pH of 7.1, metabolic acidosis with an AG of 26, and 2+ urinary ketones c/w DKA. He was placed on IVF and IV insulin and admitted to the ICU. He feels much better now. Appetite is returning and N/V has resolved. Sugars have normalized on the IV insulin and last labs showed AG had decreased to 18. His parents are at the bedside.    Past History Medical hx/ None  Surgical Hx/ Left knee ACL repair in high school  Social hx/ Single. Lives with father. Unemployed. No tobacco, quit in 07/2011. Ued to drink 30+ beers per week, none since 08/27/11.  Family hx/ Several aunts and uncles with diabetes. Father has CAD, s/p AMI at age 22.   Allergies:  No Known Allergies:   Review of Systems:   Review of Systems   Gen: + weight loss and fatigue as per HPI. HEENT: blurred vision has resolved, no sore throat. Neck: no neck pain or dysphagia. CV: No CP or palpitations. PULM: No cough or sob. ABD: No pain or N/V at this time. EXT: No leg swelling. No myalgias. ENDO: No heat/cold intolerance. PSYC: No depression. NEURO: No recent falls or tremors No numbness/paresthesias of the feet. SKIN: No rash or pruritus.  Physical Exam:   General Obese WM in NAD    Skin No rash or dermatopathy    Eyes EOMI, no conjunctival injection    ENMT OP clear, poor dentition    Head and Neck Neck supple, no thyromegaly    Respiratory and Thorax Clear b/l, no wheeze, good insp effort    Cardiovascular Tachycardia, no murmur, no carotid bruit    Gastrointestinal Soft, NT/ND, no HSM    Musculoskeletal Moves all ext, no edema     Psychiatric A&O x3    Lymphatics No neck LAD   VITAL SIGNS/ANCILLARY NOTES: **Vital Signs.:   14-Mar-13 13:00   Pulse Pulse 104   Respirations Respirations 16   Systolic BP Systolic BP 97   Diastolic BP (mmHg) Diastolic BP (mmHg) 47   Mean BP 63   Pulse Ox % Pulse Ox % 100   Pulse Ox Activity Level  At rest   Oxygen Delivery Room Air/ 21 %   Pulse Ox Heart Rate 106   Laboratory Results: Routine Chem:  14-Mar-13 09:33    Glucose, Serum   215   BUN 15   Creatinine (comp) 1.04   Sodium, Serum 144   Potassium, Serum   3.3   Chloride, Serum   111   CO2, Serum   15   Calcium (Total), Serum 9.1   Anion Gap   18   Osmolality (calc) 294   eGFR (African American) >60   eGFR (Non-African American) >60   Assessment/Plan:   Orders/Results reviewed Orders reviewed and updated, Laboratory results reviewed, Radiology results reviewed, Vital Signs reviewed    Assessment/Plan 39 yo M with new onset diabetes and DKA.  DM - Likely has type 2 diabetes given +fam hx & obesity. Certainly has a component of insulin resistance. Will assist with transition to SQ insulin once his  AG has closed. I estimate his total daily requirement of insulin to be approx 1 - 1.5 units/kg or ~110 - 160 units per day. Will start metformin to help insulin resistance and give insulin as 1/2 basal and 1/2 bolus, with Lantus 50 units qHS and Novolog 15 units qAC + NovoLog SSI qACHS. He has recieved some diabetic teaching.   Dispo - He needs close out-pt follow-up. He is uninsured and shold be set up at the [facility].    Case Discussed With patient, family   Electronic Signatures: Judi Cong (MD)  (Signed 14-Mar-13 14:23)  Authored: Chief Complaint and History, ALLERGIES, Review of Systems, Physcial Exam, Vital Signs, LABS, Assessment/Plan   Last Updated: 14-Mar-13 14:23 by Judi Cong (MD)

## 2014-10-29 NOTE — Discharge Summary (Signed)
PATIENT NAME:  Daniel Ritter, Daniel Ritter MR#:  [id number] DATE OF BIRTH:  [birth year]  DATE OF ADMISSION:  09/17/2011 DATE OF DISCHARGE:  09/20/2011  DISCHARGE DIAGNOSES:  1. Diabetic ketoacidosis, resolved.  2. New onset diabetes with hemoglobin A1c of 15.4. 3. Hypertension. 4. Thrombocytopenia. 5. Hypokalemia.  CONSULTANT: Lavone Orn, MD - Endocrinology.  HOSPITAL COURSE: This is a 39 year old male with history of alcohol abuse. He presented with decreased appetite, weakness, nausea, vomiting, dry mouth, and also weight loss. He was admitted as new onset diabetes with diabetic ketoacidosis with malignant hypertension and hypokalemia. When he presented to the Emergency Room, his sugar was 640. His bicarbonate was 11 and anion gap was 26. His creatinine was 1.21. He also had leukocytosis secondary to DKA with white count of 13.4. He was hemoconcentrated with a hemoglobin of 17.1 and platelet count of 196,000. His urinalysis showed 2+ ketones, greater than 500 glucose, nitrite and leukocyte esterase negative. His blood gas showed a pH of 7.15 and PCO2 of less than 19. So he was admitted as diabetic ketoacidosis and started on insulin drip per DKA protocol. He was admitted to the Intensive Care Unit. His chest x-ray was negative. His hemoglobin A1c was found to be 15.4. Endocrinology was also consulted. His diabetic ketoacidosis resolved. He was transitioned to insulin and metformin. Currently, he is on Lantus insulin 60 units at bedtime and NovoLog insulin 18 units three times daily before meals along with metformin 500 mg twice a day. We will discharge him on the current regimen. He had a GAD antibody sent, which is negative. Anti-pancreatic islet cells are negative. C-peptide is low at 1. He also has been given diabetic education about diet. He has been given a glucometer, and I am going to give him prescriptions for insulin supplies like insulin syringes and alcohol pads. For his blood pressure, he is on low  dose enalapril and his blood pressure is well controlled on that. His creatinine is stable at 0.82. He was hypokalemic and hypomagnesemic. He is on p.o. magnesium oxide. He is getting IV magnesium 20 mEq and p.o. potassium 40 mg before discharge. I will repeat a potassium level before discharge and I am giving him potassium at home also for a few days. His lipid profile shows that he had triglycerides of 136 and LDL of 62. When he came in, his platelet count was 195,000 but it had gone down to 97,000 later. Also, he was hemoconcentrated at admission.He also has rthrombocytopenia, this  could be secondary to alcohol abuse  He was initially started on heparin. That has been discontinued. Less likely HIT, but HIT panel has been sent on the patient. He has no evidence of any thrombosis, no bleeding from any site. I discussed with hematology at the time of discharge. I am going to refer him to hematology for follow-up for his thrombocytopenia so they can monitor his CBC. Also, he advised to obtain a HIV panel and hepatitis C panel before discharge, which I am going to do, and they are going to be followed up by [facility] as an outpatient.   DISCHARGE MEDICATIONS:  1. Lantus insulin 60 units subcutaneous at bedtime. 2. NovoLog insulin 18 units subcutaneous three times daily before meals. 3. Metformin 500 mg p.o. twice a day. 4. Enalapril 2.5 mg p.o. once daily.  5. Magnesium oxide 400 mg p.o. twice a day for 2 days. 6. Potassium chloride 20 milliequivalents p.o. once daily for 2 days.  DISCHARGE DIET: I advised a low sodium,  ADA diet.   ACTIVITY: As tolerated.   CONDITION AT DISCHARGE: He is comfortable. T-max is 98.9, heart rate 96,  blood pressure 125/78, and saturating 96% on room air. Well hydrated. Chest is clear. Heart sounds are regular. Abdomen is soft, nontender.   DISCHARGE FOLLOWUP: A new appointment has been made with [facility] on 10/13/2011 at 9:45 a.m.Marland Kitchen Please followup with Dr. Inez Pilgrim at the  [facility] on Monday, 09/22/2011, at 3:30 p.m. for follow-up of thrombocytopenia.   TIME SPENT ON DISCHARGE: 60 minutes.  ____________________________ Mena Pauls, MD ag:slb D: 09/20/2011 13:48:16 ET     T: 09/21/2011 15:05:40 ET       JOB#: [id number] cc: [place]. Inez Pilgrim, MD Mena Pauls MD ELECTRONICALLY SIGNED 09/30/2011 15:09

## 2014-10-29 NOTE — H&P (Signed)
PATIENT NAME:  Daniel Ritter, Daniel Ritter MR#:  [id number] DATE OF BIRTH:  [birth year]  DATE OF ADMISSION:  09/17/2011  PRIMARY CARE PHYSICIAN: None.  HISTORY OF PRESENT ILLNESS: Patient is a 39 year old Caucasian male with no significant past medical history who presented to the hospital with complaints of decreased appetite, feeling weak as well as having nausea and vomiting over the past one week. According to patient he started having problems with dry mouth for the past one month. He is also admitting of some chills. He had finger infection approximately a week ago which was lanced at urgent care and patient was placed on antibiotic therapy. However, since that time he started having problems with increasing nausea, vomiting and drinking plenty of fluids, also having increased frequency of urination, feeling weak and chilly and losing weight. According to patient he lost approximately 30 pounds in 1-1/2 months now. He admits of fatigue and weakness, and having chills. Admits of having some dyspnea for the past one week, dyspnea on exertion but also even at rest. Admits of having recent frequency of urination as well as increased thirst. He states that he is able to drink 1 gallon of Pepsi in one hour. Because of thirst, polydipsia as well as nocturia he decided to come to the Emergency Room for further evaluation. In the Emergency Room he was noted to be acidotic, having hyperglycemia with glucose level above 600. He was also noted to have hypertension of 200/110 and hospitalist services were contacted for admission. He was noted to be acidotic.   PAST MEDICAL HISTORY: None.   MEDICATIONS: None.   PAST SURGICAL HISTORY: Left knee surgery, anterior cruciate ligament repair approximately 20 years ago.   ALLERGIES: None.   FAMILY HISTORY: Patient's mother has anxiety. Patient's father has hypertension. Patient's brother has gout. Sisters are healthy.   SOCIAL HISTORY: Patient is single, has no children. Lives  with his father. Used to smoke 1 pack per day for 15 years, quit approximately a month ago. He drinks approximately a case and a half of beer a week. He is unemployed.   REVIEW OF SYSTEMS: CONSTITUTIONAL: Positive for fatigue and weakness, feeling chilly, weight loss approximately 30 pounds and 1.5 months, blurring of vision, some dyspnea at rest as well as on exertion for the past one week, constipation. Last bowel movement was last Thursday. Increased frequency of urination as well as nocturia, and increased thirst and drinking. Denies any fevers, pains, weight gain. EYES: In regards to eyes, denies any blurry vision, double vision, glaucoma, cataracts. ENT: Denies any tinnitus, allergies, epistaxis, sinus pain, dentures, difficulty swallowing. RESPIRATORY: Denies any cough, wheezes, asthma, chronic obstructive pulmonary disease. CARDIOVASCULAR: Denies chest pain, orthopnea, edema, arrhythmias, palpitations, or syncope. GASTROINTESTINAL: Denies any diarrhea. Admits nausea and vomiting intermittently. Denies any hematemesis, rectal bleeding, change in bowel habits. GENITOURINARY: Denies dysuria, hematuria. ENDOCRINOLOGY: Denies any thyroid problems, heat or cold intolerance. Admit of thirst. Denies increased sweating. Denies anemia, easy bruising, bleeding, swollen glands. SKIN: Denies any acne, rashes, lesions, change in moles. MUSCULOSKELETAL: Denies arthritis, cramps, swelling, gout. NEUROLOGICAL: No epilepsy or tremor. PSYCHIATRIC: No anxiety, insomnia or depression.   PHYSICAL EXAMINATION: VITAL SIGNS: On arrival to hospital: Temperature 97.8, pulse 133, respiration rate 20, blood pressure 196/108, saturation 96% on room air.   GENERAL: This is a well-developed, well-nourished, mildly obese Caucasian male in no significant distress lying on the stretcher.   HEENT: His pupils are equal, reactive to light. Extraocular movements are intact. No icterus or conjunctivitis. Has normal  hearing. No pharyngeal  erythema. Mucosa is dry.   NECK: Neck did not reveal any masses, was supple, nontender. Thyroid not enlarged. No adenopathy. No JVD or carotid bruits bilaterally. Full range of motion. Mild erythema of pharynx was noted.    LUNGS: Clear to auscultation in all fields. Mildly diminished breath sounds but otherwise no rales, rhonchi, or wheezing. No labored inspirations, increased effort, dullness to percussion, overt respiratory distress.   CARDIOVASCULAR: S1, S2 appreciated. No murmurs, gallops, rubs noted. PMI not lateralized, somewhat distant. Chest is nontender to palpation.   EXTREMITIES: 1+ pedal pulses. No lower extremity edema, calf tenderness, or cyanosis noted.   ABDOMEN: Soft, nontender. Bowel sounds are present. No hepatosplenomegaly or masses noted.   RECTAL: Deferred.   MUSCULOSKELETAL: Able to move all extremities. No cyanosis, degenerative joint disease, or kyphosis. Gait is not tested.   SKIN: Skin did not reveal any rashes, lesions, erythema, nodularity, induration. It was warm and dry to palpation.   LYMPH: No adenopathy in cervical region.   NEUROLOGIC: Cranial nerves grossly intact. Sensory is intact. No dysarthria, aphasia.   PSYCH: Patient is alert, oriented to time, person, and place, cooperative. Memory is good. No confusion, agitation, or depression noted.   LABORATORY, DIAGNOSTIC AND RADIOLOGICAL DATA: BMP showed glucose 640, potassium 3.3, bicarbonate 11, anion gap 26, lipase 199. Liver enzymes: Total protein 8.9, otherwise liver enzymes as well as BMP was unremarkable. Cardiac enzymes, first set negative. White blood cell count is elevated to 13.4, hemoglobin 17.4, platelets 196. Urinalysis: Straw clear urine, more than 500 glucose, negative for bilirubin, 2+ ketones, 1.033 specific gravity, 5.0 pH , 2+ blood, 100 mg/dL protein, negative for nitrites or leukocyte esterase, 1 red blood cell, less than 1 white blood cell, trace bacteria, less than 1 epithelial cell as  well as mucus is present. ABGs were done on room air pH 7.15, pCO2 less than 19, pO2 103, saturation 97.5% on room air. EKG: Sinus tachycardia with fusion complexes at 123 beats per minute, normal axis, possible left atrial enlargement according to EKG criteria. Nonspecific ST-T changes. No EKG to compare with.   ASSESSMENT AND PLAN:  1. Diabetic ketoacidosis. Admit patient to Critical Care Unit. Get lifestyle evaluation as well as dietary evaluation and recommendations. Start patient on insulin IV drip. Get hemoglobin A1c and depending on hemoglobin A1c will start him on oral medications or insulin Lantus. Will continue IV fluids with potassium. 2. Hypokalemia. Supplement IV. Get magnesium level.  3. Malignant hypertension. Start patient on Vasotec. 4. Dyspnea likely due to acidosis.  5. Obesity. Patient was advised to lose weight. Dietitian was consulted.  6. Alcohol abuse Watch for withdrawal.   TIME SPENT: One hour.   ____________________________ Theodoro Grist, MD rv:cms D: 09/17/2011 14:34:06 ET T: 09/17/2011 15:27:02 ET JOB#: [id number]  cc: Theodoro Grist, MD, <Dictator> Lonzy Mato MD ELECTRONICALLY SIGNED 09/27/2011 10:07

## 2014-11-08 ENCOUNTER — Encounter: Payer: Self-pay | Admitting: Neurology

## 2014-11-08 ENCOUNTER — Ambulatory Visit (INDEPENDENT_AMBULATORY_CARE_PROVIDER_SITE_OTHER): Payer: BLUE CROSS/BLUE SHIELD | Admitting: Neurology

## 2014-11-08 VITALS — BP 137/90 | HR 80 | Ht 73.0 in | Wt 301.2 lb

## 2014-11-08 DIAGNOSIS — F419 Anxiety disorder, unspecified: Secondary | ICD-10-CM | POA: Diagnosis not present

## 2014-11-08 DIAGNOSIS — I611 Nontraumatic intracerebral hemorrhage in hemisphere, cortical: Secondary | ICD-10-CM

## 2014-11-08 DIAGNOSIS — R569 Unspecified convulsions: Secondary | ICD-10-CM | POA: Diagnosis not present

## 2014-11-08 DIAGNOSIS — E119 Type 2 diabetes mellitus without complications: Secondary | ICD-10-CM

## 2014-11-08 DIAGNOSIS — I10 Essential (primary) hypertension: Secondary | ICD-10-CM | POA: Diagnosis not present

## 2014-11-08 NOTE — Progress Notes (Signed)
STROKE NEUROLOGY FOLLOW UP NOTE  NAME: Daniel Ritter DOB: 05/22/1976  REASON FOR VISIT: stroke follow up HISTORY FROM: pt and chart  Today we had the pleasure of seeing Daniel Ritter in follow-up at our Neurology Clinic. Pt was accompanied by no one.   History Summary Daniel Ritter is a 39 y.o. male with HTN and DM was admitted on 08/31/14 for episodes of left sided tingling, left facial weakness with drooling. 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 MRI also confirmed the small hematoma but MRA did not show abnormality. Cerebral angio was done during admission and also negative. His A1C was 11.5 and medicine consultation was requested and he was put on insulin. BP was controlled with lisinopril and metoprolol. He was also put on keppra for likely seizure activity. His symptoms resolved and he was discharged in good condition.   Patient reports that he hit his head against his truck at the right side head about 4-5 days before admission. However, he does have high BP and glucose which he was not taking medications. He did not watch what he ate and drink a lot of alcohol. He did not do any exercise and obese at that time.   Interval History During the interval time, the patient has been doing well. No recurrent episodes. He quit drinking and stated healthy DM diet. Doing more exercise and lost 25lbs. He also takes medication at home and his glucose done to 100-110 without insulin. He is only on metformin. His BP 130-140 at home and today in clinic 137/90. He takes lisinopril and HCTZ as well as metoprolol. He follows with his PCP in [place] but he will have a new PCP in June.     REVIEW OF SYSTEMS: Full 14 system review of systems performed and notable only for those listed below and in HPI above, all others are negative:  Constitutional:   Cardiovascular:  Ear/Nose/Throat:   Skin:  Eyes:   Respiratory:   Gastroitestinal:  diarrhea Genitourinary:  Hematology/Lymphatic:     Endocrine:  Musculoskeletal:   Allergy/Immunology:  Allergies, runny nose Neurological:   Psychiatric: anxiety Sleep: insomnia, sleepiness  The following represents the patient's updated allergies and side effects list: No Known Allergies  The neurologically relevant items on the patient's problem list were reviewed on today's visit.  Neurologic Examination  A problem focused neurological exam (12 or more points of the single system neurologic examination, vital signs counts as 1 point, cranial nerves count for 8 points) was performed.  Blood pressure 137/90, pulse 80, height 6\' 1"  (1.854 m), weight 301 lb 3.2 oz (136.623 kg).  General - overweight, well developed, in no apparent distress.  Ophthalmologic - Sharp disc margins OU.  Cardiovascular - Regular rate and rhythm with no murmur.  Mental Status -  Level of arousal and orientation to time, place, and person were intact. Language including expression, naming, repetition, comprehension, reading, and writing was assessed and found intact. Attention span and concentration were normal. Recent and remote memory were intact. Fund of Knowledge was assessed and was intact.  Cranial Nerves II - XII - II - Visual field intact OU. III, IV, VI - Extraocular movements intact. V - Facial sensation intact bilaterally. VII - Facial movement intact bilaterally. VIII - Hearing & vestibular intact bilaterally. X - Palate elevates symmetrically. XI - Chin turning & shoulder shrug intact bilaterally. XII - Tongue protrusion intact.  Motor Strength - The patient's strength was normal in all extremities and pronator drift  was absent.  Bulk was normal and fasciculations were absent.   Motor Tone - Muscle tone was assessed at the neck and appendages and was normal.  Reflexes - The patient's reflexes were normal in all extremities and he had no pathological reflexes.  Sensory - Light touch, temperature/pinprick, and Romberg testing were  assessed and were normal.    Coordination - The patient had normal movements in the hands and feet with no ataxia or dysmetria.  Tremor was absent.  Gait and Station - The patient's transfers, posture, gait, station, and turns were observed as normal.  Data reviewed: I personally reviewed the images and agree with the radiology interpretations.  [name] 09/01/2014  1. 1.3 cm focus of subacute hemorrhage in the right parietal operculum with only trace surrounding edema. This is favored to reflect recent hemorrhage in a cavernoma. Posttraumatic hemorrhagic contusion is possible. Underlying mass/ neoplasm is felt unlikely but not completely excluded, and postcontrast imaging is recommended.  2. No acute infarct.  3. Moderately motion degraded MRA without major intracranial arterial occlusion or significant proximal stenosis identified.   cerebral arteriogram 09/01/2014 No occlusions,stenosis,dissections ,AVshunting or aneurysms seen. Venous outflow WNLs  Component     Latest Ref Rng 08/31/2014 09/01/2014 09/03/2014 09/04/2014  Cholesterol     0 - 200 mg/dL    140  Triglycerides     <150 mg/dL    154 (H)  HDL Cholesterol     >39 mg/dL    24 (L)  Total CHOL/HDL Ratio         5.8  VLDL     0 - 40 mg/dL    31  LDL (calc)     0 - 99 mg/dL    85  C-ANCA     Neg:<1:20 titer    <1:20  P-ANCA     Neg:<1:20 titer    <1:20  Atypical P-ANCA titer     Neg:<1:20 titer    <1:20  Hemoglobin A1C     4.8 - 5.6 % 11.5 (H)     Mean Plasma Glucose      283     C3 Complement     82 - 167 mg/dL  149    Complement C4, Body Fluid     14 - 44 mg/dL  39    Compl, Total (CH50)     42 - 60 U/mL  >60 (H)    Sed Rate     0 - 16 mm/hr  17 (H)    RPR     Non Reactive  Non Reactive    HIV Screen 4th Generation wRfx     Non Reactive  Non Reactive    TSH     0.350 - 4.500 uIU/mL   1.511   Vitamin B-12     211 - 911 pg/mL    416  Rhuematoid fact SerPl-aCnc     0.0 - 13.9 IU/mL     10.2  SSA (Ro) (ENA) Antibody, IgG     <1.0 NEG AI    <1.0 NEG  SSB (La) (ENA) Antibody, IgG     <1.0 NEG AI    <1.0 NEG  ds DNA Ab         <1    Assessment: As you may recall, he is a 39 y.o. Caucasian male with PMH of HTN and DM was admitted on 08/31/14 for right parietal small ICH. He has episodic right arm tingling and right facial droop concerning for seizure.  He was put on keppra. No more episodes. MRA and cerebral angio did not see vessel abnormalities. His autoimmune work up also negative. Of note, he hit his head at right parietal at work 4-5 days prior to admission. He does have HTN and his A1C was 11.5 and he was not on medications. His ICH may be due to trauma or small DVA or cavernoma or related to HTN and DM. His LDL 85. He was put on insulin and BP meds on discharge. During the interval time, his glucose controlled with metformin and off insulin now and BP stable. Will do EEG to try to taper off keppra and will repeat MRI and MRA.  Plan:  - continue current medications. - check BP and glucose at home - will check EEG and taper off keppra if EEG negative - will repeat MRI and MRA to rule out underlying abnormality at right parietal lobe. If hemorrhage absorbed, may consider ASA 81 for stroke prevention. - Follow up with your primary care physician for stroke risk factor modification. Recommend maintain blood pressure goal <130/80, diabetes with hemoglobin A1c goal below 6.5% and lipids with LDL cholesterol goal below 70 mg/dL.  - RTC in 3 months  Orders Placed This Encounter  Procedures  . MR Brain W Wo Contrast    Standing Status: Future     Number of Occurrences:      Standing Expiration Date: 01/10/2016    Order Specific Question:  Reason for Exam (SYMPTOM  OR DIAGNOSIS REQUIRED)    Answer:  ICH follow up    Order Specific Question:  Preferred imaging location?    Answer:  Internal    Order Specific Question:  Does the patient have a pacemaker or implanted devices?     Answer:  No    Order Specific Question:  What is the patient's sedation requirement?    Answer:  No Sedation  . MR MRA HEAD WO CONTRAST    Standing Status: Future     Number of Occurrences:      Standing Expiration Date: 01/10/2016    Order Specific Question:  Reason for Exam (SYMPTOM  OR DIAGNOSIS REQUIRED)    Answer:  follow up [place]    Order Specific Question:  Preferred imaging location?    Answer:  Internal    Order Specific Question:  Does the patient have a pacemaker or implanted devices?    Answer:  No    Order Specific Question:  What is the patient's sedation requirement?    Answer:  No Sedation  . Basic metabolic panel  . Hemoglobin A1c  . EEG adult    Standing Status: Future     Number of Occurrences:      Standing Expiration Date: 11/08/2015    Meds ordered this encounter  Medications  . ALPRAZolam (XANAX) 1 MG tablet    Sig:     Refill:  1  . BAYER CONTOUR NEXT TEST test strip    Sig:     Refill:  5    Patient Instructions  - continue current medications - check BP and glucose at home - will check EEG and to see if we can taper off keppra  - will repeat MRI of the brain to rule out underlying abnormality - Follow up with your primary care physician for stroke risk factor modification. Recommend maintain blood pressure goal <130/80, diabetes with hemoglobin A1c goal below 6.5% and lipids with LDL cholesterol goal below 70 mg/dL.  - follow up in 3  months    Rosalin Hawking, MD PhD [facility] [address] [place], [place] [address] [phone]

## 2014-11-08 NOTE — Patient Instructions (Signed)
-   continue current medications - check BP and glucose at home - will check EEG and to see if we can taper off keppra  - will repeat MRI of the brain to rule out underlying abnormality - Follow up with your primary care physician for stroke risk factor modification. Recommend maintain blood pressure goal <130/80, diabetes with hemoglobin A1c goal below 6.5% and lipids with LDL cholesterol goal below 70 mg/dL.  - follow up in 3 months

## 2014-11-09 ENCOUNTER — Telehealth: Payer: Self-pay | Admitting: *Deleted

## 2014-11-09 LAB — BASIC METABOLIC PANEL
BUN/Creatinine Ratio: 19 (ref 9–20)
BUN: 13 mg/dL (ref 6–24)
CO2: 24 mmol/L (ref 18–29)
Calcium: 9.7 mg/dL (ref 8.7–10.2)
Chloride: 99 mmol/L (ref 97–108)
Creatinine, Ser: 0.7 mg/dL — ABNORMAL LOW (ref 0.76–1.27)
GFR calc Af Amer: 136 mL/min/{1.73_m2} (ref >59)
GFR calc non Af Amer: 118 mL/min/{1.73_m2} (ref >59)
Glucose: 96 mg/dL (ref 65–99)
Potassium: 4.1 mmol/L (ref 3.5–5.2)
Sodium: 140 mmol/L (ref 134–144)

## 2014-11-09 LAB — HEMOGLOBIN A1C
Est. average glucose Bld gHb Est-mCnc: 154 mg/dL
Hgb A1c MFr Bld: 7 % — ABNORMAL HIGH (ref 4.8–5.6)

## 2014-11-09 NOTE — Telephone Encounter (Signed)
-----   Message from Rosalin Hawking, MD sent at 11/09/2014  1:39 PM EDT ----- Graceann Congress:  Please let the pt know that his blood test the other day in our office is much improved than before. Glucose and electrolytes and kidney function are normal. His A1C now is 7.0 and much improved from 11.5 two months ago. Tell him to continue to do what he is doing, which seems working well for him. Thanks.  Rosalin Hawking, MD PhD Stroke Neurology 11/09/2014 1:38 PM

## 2014-11-09 NOTE — Telephone Encounter (Signed)
Spoke with patient's father, whom patient lives with currently. He accompanied his son, Daniel Ritter to Dr Phoebe Sharps appt this week. Gave him detailed message per Dr Erlinda Hong re: his son's lab results are normal, and his A1C is much improved. Informed his to tell Daniel Ritter to continue to do what he is doing . Mr Lipe expressed understanding and appreciation for this call. We also confirmed patient's EEG appt date/location scheduled for 11/21/14.

## 2014-11-16 ENCOUNTER — Encounter: Payer: Self-pay | Admitting: Neurology

## 2014-11-21 ENCOUNTER — Other Ambulatory Visit: Payer: BLUE CROSS/BLUE SHIELD

## 2014-11-23 ENCOUNTER — Other Ambulatory Visit: Payer: BLUE CROSS/BLUE SHIELD

## 2014-12-07 ENCOUNTER — Ambulatory Visit (INDEPENDENT_AMBULATORY_CARE_PROVIDER_SITE_OTHER): Payer: BLUE CROSS/BLUE SHIELD | Admitting: Neurology

## 2014-12-07 ENCOUNTER — Telehealth: Payer: Self-pay | Admitting: *Deleted

## 2014-12-07 DIAGNOSIS — R569 Unspecified convulsions: Secondary | ICD-10-CM | POA: Diagnosis not present

## 2014-12-07 DIAGNOSIS — I611 Nontraumatic intracerebral hemorrhage in hemisphere, cortical: Secondary | ICD-10-CM

## 2014-12-07 NOTE — Procedures (Signed)
    History:  Daniel Ritter is a 39 year old patient with a history of a right parietal intracranial hemorrhage associated with episodes of left-sided tingling, left facial droop and drooling. The patient has been placed on Keppra or presumed seizure-type events. The patient is being evaluated for the these episodes.  This is a routine EEG. No skull defects are noted. Medications include Xanax, insulin, Keppra, Prinizide, metformin, and Toprol.   EEG classification: Normal awake  Description of the recording: The background rhythms of this recording consists of a fairly well modulated medium amplitude alpha rhythm of 9 Hz that is reactive to eye opening and closure. As the record progresses, the patient appears to remain in the waking state throughout the recording. Photic stimulation was performed, resulting in a bilateral and symmetric photic driving response. Hyperventilation was also performed, resulting in a minimal buildup of the background rhythm activities without significant slowing seen. At no time during the recording does there appear to be evidence of spike or spike wave discharges or evidence of focal slowing. EKG monitor shows no evidence of cardiac rhythm abnormalities with a heart rate of 84.  Impression: This is a normal EEG recording in the waking state. No evidence of ictal or interictal discharges are seen.

## 2014-12-07 NOTE — Telephone Encounter (Signed)
-----   Message from Lester Oberlin, RN sent at 12/07/2014  1:04 PM EDT -----   ----- Message -----    From: Rosalin Hawking, MD    Sent: 12/07/2014   1:01 PM      To: [facility] Triage Pool  Could you please let the patient know that his EEG done in our office was normal? No change of current treatment plan. Thank you.  Rosalin Hawking, MD PhD Stroke Neurology 12/07/2014 1:01 PM

## 2014-12-07 NOTE — Telephone Encounter (Signed)
Spoke with patient's father and relayed Dr Phoebe Sharps message, re: EEG results. He verbalized understanding, appreciation.

## 2015-05-16 ENCOUNTER — Other Ambulatory Visit: Payer: Self-pay | Admitting: Physician Assistant

## 2015-06-25 ENCOUNTER — Other Ambulatory Visit: Payer: Self-pay | Admitting: Physician Assistant

## 2015-11-19 ENCOUNTER — Other Ambulatory Visit: Payer: Self-pay | Admitting: Physician Assistant

## 2015-12-12 ENCOUNTER — Encounter: Payer: Self-pay | Admitting: General Surgery

## 2015-12-12 ENCOUNTER — Other Ambulatory Visit
Admission: RE | Admit: 2015-12-12 | Discharge: 2015-12-12 | Disposition: A | Discharge status: Home / Self Care | Payer: BLUE CROSS/BLUE SHIELD | Source: Ambulatory Visit | Attending: General Surgery | Admitting: General Surgery

## 2015-12-12 ENCOUNTER — Other Ambulatory Visit: Payer: Self-pay

## 2015-12-12 ENCOUNTER — Ambulatory Visit
Admission: RE | Admit: 2015-12-12 | Discharge: 2015-12-12 | Disposition: A | Discharge status: Home / Self Care | Payer: BLUE CROSS/BLUE SHIELD | Source: Ambulatory Visit | Attending: General Surgery | Admitting: General Surgery

## 2015-12-12 ENCOUNTER — Ambulatory Visit (INDEPENDENT_AMBULATORY_CARE_PROVIDER_SITE_OTHER): Payer: BLUE CROSS/BLUE SHIELD | Admitting: General Surgery

## 2015-12-12 VITALS — BP 158/82 | HR 108 | Resp 18 | Ht 73.5 in | Wt 305.0 lb

## 2015-12-12 DIAGNOSIS — K611 Rectal abscess: Secondary | ICD-10-CM | POA: Diagnosis not present

## 2015-12-12 DIAGNOSIS — Z01810 Encounter for preprocedural cardiovascular examination: Secondary | ICD-10-CM

## 2015-12-12 LAB — CBC WITH DIFFERENTIAL/PLATELET
Basophils Absolute: 0.2 10*3/uL — ABNORMAL HIGH (ref 0–0.1)
Basophils Relative: 1 %
Eosinophils Absolute: 0.2 10*3/uL (ref 0–0.7)
Eosinophils Relative: 1 %
HCT: 46.6 % (ref 40.0–52.0)
Hemoglobin: 16 g/dL (ref 13.0–18.0)
Lymphocytes Relative: 11 %
Lymphs Abs: 1.8 10*3/uL (ref 1.0–3.6)
MCH: 30.7 pg (ref 26.0–34.0)
MCHC: 34.3 g/dL (ref 32.0–36.0)
MCV: 89.4 fL (ref 80.0–100.0)
Monocytes Absolute: 1.1 10*3/uL — ABNORMAL HIGH (ref 0.2–1.0)
Monocytes Relative: 7 %
Neutro Abs: 12.5 10*3/uL — ABNORMAL HIGH (ref 1.4–6.5)
Neutrophils Relative %: 80 %
Platelets: 171 10*3/uL (ref 150–440)
RBC: 5.21 MIL/uL (ref 4.40–5.90)
RDW: 13.8 % (ref 11.5–14.5)
WBC: 15.8 10*3/uL — ABNORMAL HIGH (ref 3.8–10.6)

## 2015-12-12 LAB — BASIC METABOLIC PANEL
Anion gap: 9 (ref 5–15)
BUN: 11 mg/dL (ref 6–20)
CO2: 23 mmol/L (ref 22–32)
Calcium: 9.1 mg/dL (ref 8.9–10.3)
Chloride: 104 mmol/L (ref 101–111)
Creatinine, Ser: 0.81 mg/dL (ref 0.61–1.24)
GFR calc Af Amer: >60 mL/min (ref >60)
GFR calc non Af Amer: >60 mL/min (ref >60)
Glucose, Bld: 176 mg/dL — ABNORMAL HIGH (ref 65–99)
Potassium: 4.1 mmol/L (ref 3.5–5.1)
Sodium: 136 mmol/L (ref 135–145)
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
## 2015-12-12 NOTE — Patient Instructions (Signed)
Perirectal Abscess An abscess is an infected area that contains a collection of pus. A perirectal abscess is an abscess that is near the opening of the anus or around the rectum. A perirectal abscess can cause a lot of pain, especially during bowel movements. CAUSES This condition is almost always caused by an infection that starts in an anal gland. RISK FACTORS This condition is more likely to develop in:  People with diabetes or inflammatory bowel disease.  People whose body defense system (immune system) is weak.  People who have anal sex.  People who have a sexually transmitted disease (STD).  People who have certain kinds of cancers, such as rectal carcinoma, leukemia, or lymphoma. SYMPTOMS The main symptom of this condition is pain. The pain may be a throbbing pain that gets worse during bowel movements. Other symptoms include:  Fever.  Swelling.  Redness.  Bleeding.  Constipation. DIAGNOSIS The condition is diagnosed with a physical exam. If the abscess is not visible, a health care provider may need to place a finger inside the rectum to find the abscess. Sometimes, imaging tests are done to determine the size and location of the abscess. These tests may include:  An ultrasound.  An MRI.  A CT scan. TREATMENT This condition is usually treated with incision and drainage surgery. Incision and drainage surgery involves making an incision over the abscess to drain the pus. Treatment may also involve antibiotic medicine, pain medicine, stool softeners, or laxatives. HOME CARE INSTRUCTIONS  Take medicines only as directed by your health care provider.  If you were prescribed an antibiotic, finish all of it even if you start to feel better.  To relieve pain, try sitting:  In a warm, shallow bath (sitz bath).  On a heating pad with the setting on low.  On an inflatable donut-shaped cushion.  Follow any diet instructions as directed by your health care  provider.  Keep all follow-up visits as directed by your health care provider. This is important. SEEK MEDICAL CARE IF:  Your abscess is bleeding.  You have pain, swelling, or redness that is getting worse.  You are constipated.  You feel ill.  You have muscle aches or chills.  You have a fever.  Your symptoms return after the abscess has healed.   This information is not intended to replace advice given to you by your health care provider. Make sure you discuss any questions you have with your health care provider.   Document Released: 06/20/2000 Document Revised: 03/14/2015 Document Reviewed: 05/03/2014 Elsevier Interactive Patient Education 2016 Elsevier Inc[employer]  

## 2015-12-12 NOTE — Progress Notes (Signed)
Patient ID: Daniel Ritter, male   DOB: Aug 12, 1975, 40 y.o.   MRN: [id number] Patient is scheduled for surgery at [facility] on 12/14/15. He will pre admit at the hospital on 12/14/15 at 10:30 am. He will have nothing to eat or drink after midnight the night before and only take his Keppra that morning with a small sip of water. Patient is aware of date, time, and instructions.

## 2015-12-13 NOTE — Pre-Procedure Instructions (Signed)
REVIEWED LABS/EKG AND SPOKE WITH Daniel Ritter DR SANKAR'S.OK TO PREOP IN AM

## 2015-12-14 ENCOUNTER — Ambulatory Visit: Payer: BLUE CROSS/BLUE SHIELD | Admitting: Anesthesiology

## 2015-12-14 ENCOUNTER — Ambulatory Visit
Admission: RE | Admit: 2015-12-14 | Discharge: 2015-12-14 | Disposition: A | Discharge status: Home / Self Care | Payer: BLUE CROSS/BLUE SHIELD | Source: Ambulatory Visit | Attending: General Surgery | Admitting: General Surgery

## 2015-12-14 ENCOUNTER — Encounter
Admission: RE | Disposition: A | Discharge status: Home / Self Care | Payer: Self-pay | Source: Ambulatory Visit | Attending: General Surgery

## 2015-12-14 DIAGNOSIS — Z8673 Personal history of transient ischemic attack (TIA), and cerebral infarction without residual deficits: Secondary | ICD-10-CM | POA: Insufficient documentation

## 2015-12-14 DIAGNOSIS — Z8719 Personal history of other diseases of the digestive system: Secondary | ICD-10-CM | POA: Insufficient documentation

## 2015-12-14 DIAGNOSIS — F419 Anxiety disorder, unspecified: Secondary | ICD-10-CM | POA: Diagnosis not present

## 2015-12-14 DIAGNOSIS — F17210 Nicotine dependence, cigarettes, uncomplicated: Secondary | ICD-10-CM | POA: Insufficient documentation

## 2015-12-14 DIAGNOSIS — K611 Rectal abscess: Secondary | ICD-10-CM | POA: Diagnosis not present

## 2015-12-14 DIAGNOSIS — I10 Essential (primary) hypertension: Secondary | ICD-10-CM | POA: Diagnosis not present

## 2015-12-14 DIAGNOSIS — Z79899 Other long term (current) drug therapy: Secondary | ICD-10-CM | POA: Diagnosis not present

## 2015-12-14 DIAGNOSIS — E119 Type 2 diabetes mellitus without complications: Secondary | ICD-10-CM | POA: Diagnosis not present

## 2015-12-14 HISTORY — PX: INCISION AND DRAINAGE PERIRECTAL ABSCESS: SHX1804

## 2015-12-14 LAB — GLUCOSE, CAPILLARY
Glucose-Capillary: 156 mg/dL — ABNORMAL HIGH (ref 65–99)
Glucose-Capillary: 157 mg/dL — ABNORMAL HIGH (ref 65–99)

## 2015-12-14 SURGERY — INCISION AND DRAINAGE, ABSCESS, PERIRECTAL
Anesthesia: General

## 2015-12-14 MED ORDER — LACTATED RINGERS IV SOLN: INTRAVENOUS | Status: DC

## 2015-12-14 MED ORDER — SODIUM CHLORIDE 0.9 % IV SOLN
INTRAVENOUS | Status: DC
  Administered 2015-12-14: 10:00:00 via INTRAVENOUS

## 2015-12-14 MED ORDER — PROPOFOL 10 MG/ML IV BOLUS
INTRAVENOUS | Status: DC | PRN
  Administered 2015-12-14: 200 mg via INTRAVENOUS
  Administered 2015-12-14: 50 mg via INTRAVENOUS

## 2015-12-14 MED ORDER — BUPIVACAINE-EPINEPHRINE (PF) 0.5% -1:200000 IJ SOLN
INTRAMUSCULAR | Status: AC
  Filled 2015-12-14: qty 30

## 2015-12-14 MED ORDER — FAMOTIDINE 20 MG PO TABS
ORAL_TABLET | ORAL | Status: AC
  Filled 2015-12-14: qty 1

## 2015-12-14 MED ORDER — FAMOTIDINE 20 MG PO TABS
20.0000 mg | ORAL_TABLET | Freq: Every day | ORAL | Status: AC
  Administered 2015-12-14: 20 mg via ORAL

## 2015-12-14 MED ORDER — LIDOCAINE HCL (CARDIAC) 20 MG/ML IV SOLN
INTRAVENOUS | Status: DC | PRN
  Administered 2015-12-14: 100 mg via INTRAVENOUS

## 2015-12-14 MED ORDER — FENTANYL CITRATE (PF) 100 MCG/2ML IJ SOLN: 25.0000 ug | INTRAMUSCULAR | Status: DC | PRN

## 2015-12-14 MED ORDER — MIDAZOLAM HCL 2 MG/2ML IJ SOLN
INTRAMUSCULAR | Status: DC | PRN
  Administered 2015-12-14: 2 mg via INTRAVENOUS

## 2015-12-14 MED ORDER — FENTANYL CITRATE (PF) 100 MCG/2ML IJ SOLN
INTRAMUSCULAR | Status: DC | PRN
  Administered 2015-12-14 (×3): 50 ug via INTRAVENOUS

## 2015-12-14 MED ORDER — SODIUM CHLORIDE 0.9 % IV SOLN
1.0000 g | INTRAVENOUS | Status: AC
  Administered 2015-12-14: 1 g via INTRAVENOUS
  Filled 2015-12-14: qty 1

## 2015-12-14 MED ORDER — ONDANSETRON HCL 4 MG/2ML IJ SOLN: 4.0000 mg | Freq: Once | INTRAMUSCULAR | Status: DC | PRN

## 2015-12-14 MED ORDER — ONDANSETRON HCL 4 MG/2ML IJ SOLN
INTRAMUSCULAR | Status: DC | PRN
  Administered 2015-12-14: 4 mg via INTRAVENOUS

## 2015-12-14 SURGICAL SUPPLY — 21 items
BLADE CLIPPER SURG (BLADE) ×2 IMPLANT
CANISTER SUCT 1200ML W/VALVE (MISCELLANEOUS) ×2 IMPLANT
CHLORAPREP W/TINT 26ML (MISCELLANEOUS) ×2 IMPLANT
DRAPE LAPAROTOMY 100X77 ABD (DRAPES) ×2 IMPLANT
ELECT REM PT RETURN 9FT ADLT (ELECTROSURGICAL)
ELECTRODE REM PT RTRN 9FT ADLT (ELECTROSURGICAL) IMPLANT
GLOVE BIO SURGEON STRL SZ7.5 (GLOVE) ×2 IMPLANT
GOWN STRL REUS W/ TWL LRG LVL3 (GOWN DISPOSABLE) ×2 IMPLANT
GOWN STRL REUS W/TWL LRG LVL3 (GOWN DISPOSABLE) ×2
KIT RM TURNOVER STRD PROC AR (KITS) ×2 IMPLANT
LABEL OR SOLS (LABEL) ×2 IMPLANT
NEEDLE HYPO 25X1 1.5 SAFETY (NEEDLE) IMPLANT
NS IRRIG 500ML POUR BTL (IV SOLUTION) IMPLANT
PACK BASIN MINOR ARMC (MISCELLANEOUS) ×2 IMPLANT
SUT ETHILON 3-0 FS-10 30 BLK (SUTURE) ×2
SUT VIC AB 3-0 SH 27 (SUTURE) ×1
SUT VIC AB 3-0 SH 27X BRD (SUTURE) ×1 IMPLANT
SUTURE EHLN 3-0 FS-10 30 BLK (SUTURE) ×1 IMPLANT
SWABSTK COMLB BENZOIN TINCTURE (MISCELLANEOUS) ×4 IMPLANT
SYRINGE 10CC LL (SYRINGE) ×2 IMPLANT
TAPE ADH 3 LX (MISCELLANEOUS) ×2 IMPLANT

## 2015-12-14 NOTE — Discharge Instructions (Signed)
Perirectal Abscess An abscess is an infected area that contains a collection of pus. A perirectal abscess is an abscess that is near the opening of the anus or around the rectum. A perirectal abscess can cause a lot of pain, especially during bowel movements. CAUSES This condition is almost always caused by an infection that starts in an anal gland. RISK FACTORS This condition is more likely to develop in:  People with diabetes or inflammatory bowel disease.  People whose body defense system (immune system) is weak.  People who have anal sex.  People who have a sexually transmitted disease (STD).  People who have certain kinds of cancers, such as rectal carcinoma, leukemia, or lymphoma. SYMPTOMS The main symptom of this condition is pain. The pain may be a throbbing pain that gets worse during bowel movements. Other symptoms include:  Fever.  Swelling.  Redness.  Bleeding.  Constipation. DIAGNOSIS The condition is diagnosed with a physical exam. If the abscess is not visible, a health care provider may need to place a finger inside the rectum to find the abscess. Sometimes, imaging tests are done to determine the size and location of the abscess. These tests may include:  An ultrasound.  An MRI.  A CT scan. TREATMENT This condition is usually treated with incision and drainage surgery. Incision and drainage surgery involves making an incision over the abscess to drain the pus. Treatment may also involve antibiotic medicine, pain medicine, stool softeners, or laxatives. HOME CARE INSTRUCTIONS  Take medicines only as directed by your health care provider.  If you were prescribed an antibiotic, finish all of it even if you start to feel better.  To relieve pain, try sitting:  In a warm, shallow bath (sitz bath).  On a heating pad with the setting on low.  On an inflatable donut-shaped cushion.  Follow any diet instructions as directed by your health care  provider.  Keep all follow-up visits as directed by your health care provider. This is important. SEEK MEDICAL CARE IF:  Your abscess is bleeding.  You have pain, swelling, or redness that is getting worse.  You are constipated.  You feel ill.  You have muscle aches or chills.  You have a fever.  Your symptoms return after the abscess has healed.   This information is not intended to replace advice given to you by your health care provider. Make sure you discuss any questions you have with your health care provider.   Document Released: 06/20/2000 Document Revised: 03/14/2015 Document Reviewed: 05/03/2014 Elsevier Interactive Patient Education 2016 [employer]   1) The drugs that you were given will stay in your system until tomorrow so for the next 24 hours you should not:  A) Drive an automobile B) Make any legal decisions C) Drink any alcoholic beverage   2) You may resume regular meals tomorrow.  Today it is better to start with liquids and gradually work up to solid foods.  You may eat anything you prefer, but it is better to start with liquids, then soup and crackers, and gradually work up to solid foods.   3) Please notify your doctor immediately if you have any unusual bleeding, trouble breathing, redness and pain at the surgery site, drainage, fever, or pain not relieved by medication.    4) Additional Instructions:        Please contact your physician with any problems or Same Day Surgery at [phone], Monday through Friday 6 am to 4  pm, or [place] at [facility] number at [phone].

## 2015-12-14 NOTE — Anesthesia Postprocedure Evaluation (Signed)
Anesthesia Post Note  Patient: Daniel Ritter  Procedure(s) Performed: Procedure(s) (LRB): IRRIGATION AND DEBRIDEMENT PERIRECTAL ABSCESS (N/A)  Patient location during evaluation: PACU Anesthesia Type: General Level of consciousness: awake and alert Pain management: pain level controlled Vital Signs Assessment: post-procedure vital signs reviewed and stable Respiratory status: spontaneous breathing, nonlabored ventilation, respiratory function stable and patient connected to nasal cannula oxygen Cardiovascular status: blood pressure returned to baseline and stable Postop Assessment: no signs of nausea or vomiting Anesthetic complications: no    Last Vitals:  Filed Vitals:   12/14/15 1123 12/14/15 1137  BP: 119/78 145/91  Pulse: 80 87  Temp:  36.8 C  Resp: 21 16    Last Pain:  Filed Vitals:   12/14/15 1139  PainSc: 3                  Martha Clan
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
## 2015-12-14 NOTE — Op Note (Signed)
Preop diagnosis: Perirectal abscess  Post op diagnosis: Same  Operation: Drainage perirectal abscess  Surgeon: Mckinley Jewel  Assistant:     Anesthesia: Gen.  Complications: None  EBL: None  Drains: None  Description: Patient was put to sleep in supine position the operating table and placed in lithotomy position. Timeout was performed. The anal area was prepped with Betadine and draped with towels. Patient had reported that the abscess has spontaneously drained yesterday. Anal exam showed that he had healed scars on the right side from previous abscesses there was no open area on the skin on the left side we had the perirectal abscess. Digital exam showed that the pocket positive extended about 6 cm alongside the left side of the rectum had  completely drained out withonly a small amount of pus mixed with stool the drained out through an opening internally. With speculum exam this area was visualized and did not appear to have any more drainage after. The upper edge of the abscesses was palpable was a small ridge but the diffuse bulge there was a previously noted on the left side extending 6 cm is resolved. She felt of the abscess was adequately drained. Patient subsequently was extubated and returned recovery room in stable condition.

## 2015-12-14 NOTE — Anesthesia Procedure Notes (Signed)
Procedure Name: LMA Insertion Performed by: Lance Muss Pre-anesthesia Checklist: Patient identified, Emergency Drugs available, Suction available, Patient being monitored and Timeout performed Patient Re-evaluated:Patient Re-evaluated prior to inductionOxygen Delivery Method: Circle system utilized Preoxygenation: Pre-oxygenation with 100% oxygen Intubation Type: IV induction LMA: LMA inserted LMA Size: 5.0 Number of attempts: 1 Placement Confirmation: positive ETCO2 and breath sounds checked- equal and bilateral Tube secured with: Tape Dental Injury: Teeth and Oropharynx as per pre-operative assessment

## 2015-12-14 NOTE — Anesthesia Preprocedure Evaluation (Signed)
Anesthesia Evaluation  Patient identified by MRN, date of birth, ID band Patient awake    Reviewed: Allergy & Precautions, H&P , NPO status , Patient's Chart, lab work & pertinent test results, reviewed documented beta blocker date and time   History of Anesthesia Complications Negative for: history of anesthetic complications  Airway Mallampati: III  TM Distance: >3 FB Neck ROM: full    Dental no notable dental hx. (+) Poor Dentition   Pulmonary neg shortness of breath, sleep apnea (likely based on history, but not diagnosed) , neg COPD, neg recent URI, Current Smoker,    Pulmonary exam normal breath sounds clear to auscultation       Cardiovascular Exercise Tolerance: Good hypertension, (-) angina(-) CAD, (-) Past MI, (-) Cardiac Stents and (-) CABG Normal cardiovascular exam(-) dysrhythmias (-) Valvular Problems/Murmurs Rhythm:regular Rate:Normal     Neuro/Psych Seizures -, Well Controlled,  CVA, No Residual Symptoms negative psych ROS   GI/Hepatic negative GI ROS, Neg liver ROS,   Endo/Other  diabetesMorbid obesity  Renal/GU negative Renal ROS  negative genitourinary   Musculoskeletal   Abdominal   Peds  Hematology negative hematology ROS (+)   Anesthesia Other Findings Past Medical History:   Stroke ([place])                                    08-31-13      Diabetes mellitus without complication ([place])                 Anxiety                                                      Allergy                                                        Comment:seasonal   Pancreatitis                                                   Comment:history of, pt states due to alcohol   Seizures ([place])                                  08/2014         Comment:" two seizures in the hospital"   Hypertension                                    2007         Reproductive/Obstetrics negative OB ROS                              Anesthesia Physical Anesthesia Plan  ASA: III  Anesthesia Plan: General   Post-op Pain Management:    Induction:   Airway Management Planned:  Additional Equipment:   Intra-op Plan:   Post-operative Plan:   Informed Consent: I have reviewed the patients History and Physical, chart, labs and discussed the procedure including the risks, benefits and alternatives for the proposed anesthesia with the patient or authorized representative who has indicated his/her understanding and acceptance.   Dental Advisory Given  Plan Discussed with: Anesthesiologist, CRNA and Surgeon  Anesthesia Plan Comments:         Anesthesia Quick Evaluation

## 2015-12-14 NOTE — Interval H&P Note (Signed)
History and Physical Interval Note:  12/14/2015 10:02 AM  Daniel Ritter  has presented today for surgery, with the diagnosis of PERIRECTAL ABSCESS  The various methods of treatment have been discussed with the patient and family. After consideration of risks, benefits and other options for treatment, the patient has consented to  Procedure(s): IRRIGATION AND DEBRIDEMENT PERIRECTAL ABSCESS (N/A) as a surgical intervention .  The patient's history has been reviewed, patient examined, no change in status, stable for surgery.  I have reviewed the patient's chart and labs.  Questions were answered to the patient's satisfaction.     Emylie Amster G

## 2015-12-14 NOTE — Transfer of Care (Signed)
Immediate Anesthesia Transfer of Care Note  Patient: Daniel Ritter  Procedure(s) Performed: Procedure(s): IRRIGATION AND DEBRIDEMENT PERIRECTAL ABSCESS (N/A)  Patient Location: PACU  Anesthesia Type:General  Level of Consciousness: awake, alert  and oriented  Airway & Oxygen Therapy: Patient Spontanous Breathing and Patient connected to face mask oxygen  Post-op Assessment: Report given to RN and Post -op Vital signs reviewed and stable  Post vital signs: Reviewed and stable  Last Vitals:  Filed Vitals:   12/14/15 0945 12/14/15 1043  BP: 189/106 149/90  Pulse:  92  Temp: 37.3 C   Resp: 18 23    Last Pain: There were no vitals filed for this visit.       Complications: No apparent anesthesia complications

## 2015-12-26 ENCOUNTER — Ambulatory Visit: Payer: BLUE CROSS/BLUE SHIELD | Admitting: General Surgery

## 2016-02-12 ENCOUNTER — Encounter: Payer: Self-pay | Admitting: *Deleted

## 2017-05-19 ENCOUNTER — Ambulatory Visit: Payer: BLUE CROSS/BLUE SHIELD | Admitting: Internal Medicine

## 2017-05-19 ENCOUNTER — Telehealth: Payer: Self-pay | Admitting: *Deleted

## 2017-05-19 NOTE — Telephone Encounter (Signed)
Copied from [place] [id number]. Topic: Appointment Scheduling - Prior Auth Required for Appointment >> May 19, 2017  8:31 AM Robina Ade, Helene Kelp D wrote: No appointment has been scheduled. Patient is requesting a sooner appointment. Per scheduling protocol, this appointment requires a prior authorization prior to scheduling. 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 Patient reschedule for 07/01/17 but would like to know if Dr. Silvio Pate can work him in sooner. Please call patient, thanks. Route to department's PEC pool.

## 2017-05-19 NOTE — Telephone Encounter (Signed)
I generally don't reschedule a new patient if they don't show up the first time. I would defer the decision about the cancellation fee to [place] (I generally don't like to charge it). I am okay with trying to get him in earlier if possible---can use a Wednesday at noon (they have just been reopened up) if that works for him. If he doesn't show up again, that is it--no other appts from me

## 2017-05-19 NOTE — Telephone Encounter (Signed)
Patient cancelled his appointment today after his appointment time. Do you want the patient to be charged for late cancellation? Can patient be worked in before 07/01/17?

## 2017-05-25 NOTE — Telephone Encounter (Signed)
Patient called back and wanted to be seen the first week of December. He said moving him to another provider was fine, so we put him with Carlean Purl. Cancelled appt with [facility] for dec 26

## 2017-05-26 NOTE — Telephone Encounter (Signed)
Please sign encounter and close if completed. 

## 2017-06-08 ENCOUNTER — Ambulatory Visit: Payer: BLUE CROSS/BLUE SHIELD | Admitting: Family Medicine

## 2017-07-01 ENCOUNTER — Ambulatory Visit: Payer: BLUE CROSS/BLUE SHIELD | Admitting: Internal Medicine

## 2019-05-10 ENCOUNTER — Ambulatory Visit: Payer: Self-pay | Admitting: Family Medicine

## 2019-06-06 ENCOUNTER — Ambulatory Visit: Payer: Self-pay | Admitting: Family Medicine

## 2020-04-25 ENCOUNTER — Inpatient Hospital Stay: Payer: Medicaid Other | Admitting: Oncology

## 2020-04-25 ENCOUNTER — Inpatient Hospital Stay: Payer: Medicaid Other

## 2020-05-02 ENCOUNTER — Other Ambulatory Visit: Payer: Self-pay

## 2020-05-02 ENCOUNTER — Inpatient Hospital Stay: Payer: Medicaid Other | Attending: Oncology | Admitting: Oncology

## 2020-05-02 ENCOUNTER — Encounter (INDEPENDENT_AMBULATORY_CARE_PROVIDER_SITE_OTHER): Payer: Self-pay

## 2020-05-02 ENCOUNTER — Encounter: Payer: Self-pay | Admitting: Oncology

## 2020-05-02 ENCOUNTER — Inpatient Hospital Stay: Payer: Medicaid Other

## 2020-05-02 VITALS — BP 130/87 | HR 94 | Temp 99.4°F | Resp 18 | Ht 73.0 in | Wt 201.7 lb

## 2020-05-02 DIAGNOSIS — R634 Abnormal weight loss: Secondary | ICD-10-CM | POA: Diagnosis not present

## 2020-05-02 DIAGNOSIS — R7401 Elevation of levels of liver transaminase levels: Secondary | ICD-10-CM | POA: Diagnosis not present

## 2020-05-02 DIAGNOSIS — Z79899 Other long term (current) drug therapy: Secondary | ICD-10-CM | POA: Insufficient documentation

## 2020-05-02 DIAGNOSIS — I10 Essential (primary) hypertension: Secondary | ICD-10-CM | POA: Insufficient documentation

## 2020-05-02 DIAGNOSIS — D696 Thrombocytopenia, unspecified: Secondary | ICD-10-CM

## 2020-05-02 DIAGNOSIS — E119 Type 2 diabetes mellitus without complications: Secondary | ICD-10-CM | POA: Diagnosis not present

## 2020-05-02 DIAGNOSIS — Z8673 Personal history of transient ischemic attack (TIA), and cerebral infarction without residual deficits: Secondary | ICD-10-CM | POA: Diagnosis not present

## 2020-05-02 DIAGNOSIS — Z87891 Personal history of nicotine dependence: Secondary | ICD-10-CM | POA: Insufficient documentation

## 2020-05-02 DIAGNOSIS — F419 Anxiety disorder, unspecified: Secondary | ICD-10-CM | POA: Diagnosis not present

## 2020-05-02 DIAGNOSIS — R238 Other skin changes: Secondary | ICD-10-CM

## 2020-05-02 DIAGNOSIS — Z7984 Long term (current) use of oral hypoglycemic drugs: Secondary | ICD-10-CM | POA: Insufficient documentation

## 2020-05-02 DIAGNOSIS — R233 Spontaneous ecchymoses: Secondary | ICD-10-CM

## 2020-05-02 DIAGNOSIS — Z87898 Personal history of other specified conditions: Secondary | ICD-10-CM | POA: Diagnosis not present

## 2020-05-02 LAB — CBC WITH DIFFERENTIAL/PLATELET
Abs Immature Granulocytes: 0.09 10*3/uL — ABNORMAL HIGH (ref 0.00–0.07)
Basophils Absolute: 0.2 10*3/uL — ABNORMAL HIGH (ref 0.0–0.1)
Basophils Relative: 2 %
Eosinophils Absolute: 0.1 10*3/uL (ref 0.0–0.5)
Eosinophils Relative: 2 %
HCT: 38.4 % — ABNORMAL LOW (ref 39.0–52.0)
Hemoglobin: 13.3 g/dL (ref 13.0–17.0)
Immature Granulocytes: 1 %
Lymphocytes Relative: 21 %
Lymphs Abs: 1.4 10*3/uL (ref 0.7–4.0)
MCH: 31.7 pg (ref 26.0–34.0)
MCHC: 34.6 g/dL (ref 30.0–36.0)
MCV: 91.4 fL (ref 80.0–100.0)
Monocytes Absolute: 1.4 10*3/uL — ABNORMAL HIGH (ref 0.1–1.0)
Monocytes Relative: 20 %
Neutro Abs: 3.6 10*3/uL (ref 1.7–7.7)
Neutrophils Relative %: 54 %
Platelets: 108 10*3/uL — ABNORMAL LOW (ref 150–400)
RBC: 4.2 MIL/uL — ABNORMAL LOW (ref 4.22–5.81)
RDW: 12.5 % (ref 11.5–15.5)
WBC: 6.8 10*3/uL (ref 4.0–10.5)
nRBC: 0 % (ref 0.0–0.2)

## 2020-05-02 LAB — FOLATE: Folate: 15.4 ng/mL (ref >5.9)

## 2020-05-02 LAB — COMPREHENSIVE METABOLIC PANEL
ALT: 83 U/L — ABNORMAL HIGH (ref 0–44)
AST: 73 U/L — ABNORMAL HIGH (ref 15–41)
Albumin: 4.5 g/dL (ref 3.5–5.0)
Alkaline Phosphatase: 89 U/L (ref 38–126)
Anion gap: 11 (ref 5–15)
BUN: 9 mg/dL (ref 6–20)
CO2: 26 mmol/L (ref 22–32)
Calcium: 9.7 mg/dL (ref 8.9–10.3)
Chloride: 99 mmol/L (ref 98–111)
Creatinine, Ser: 0.56 mg/dL — ABNORMAL LOW (ref 0.61–1.24)
GFR, Estimated: >60 mL/min (ref >60)
Glucose, Bld: 146 mg/dL — ABNORMAL HIGH (ref 70–99)
Potassium: 4 mmol/L (ref 3.5–5.1)
Sodium: 136 mmol/L (ref 135–145)
Total Bilirubin: 1.7 mg/dL — ABNORMAL HIGH (ref 0.3–1.2)
Total Protein: 8.9 g/dL — ABNORMAL HIGH (ref 6.5–8.1)

## 2020-05-02 LAB — VITAMIN B12: Vitamin B-12: 3380 pg/mL — ABNORMAL HIGH (ref 180–914)

## 2020-05-02 LAB — LACTATE DEHYDROGENASE: LDH: 95 U/L — ABNORMAL LOW (ref 98–192)

## 2020-05-02 LAB — HEPATITIS PANEL, ACUTE
HCV Ab: NONREACTIVE
Hep A IgM: NONREACTIVE
Hep B C IgM: NONREACTIVE
Hepatitis B Surface Ag: NONREACTIVE

## 2020-05-02 LAB — HIV ANTIBODY (ROUTINE TESTING W REFLEX): HIV Screen 4th Generation wRfx: NONREACTIVE

## 2020-05-02 LAB — IMMATURE PLATELET FRACTION: Immature Platelet Fraction: 6 % (ref 1.2–8.6)

## 2020-05-02 LAB — PROTIME-INR
INR: 1.1 (ref 0.8–1.2)
Prothrombin Time: 13.5 seconds (ref 11.4–15.2)

## 2020-05-02 LAB — APTT: aPTT: 33 seconds (ref 24–36)

## 2020-05-02 LAB — PATHOLOGIST SMEAR REVIEW

## 2020-05-02 NOTE — Progress Notes (Signed)
Hematology/Oncology Consult note [facility] [phone] [phone]   Patient Care Team: Alene Mires Elyse Jarvis, MD as PCP - General (Family Medicine) Karen Kitchens, MD as Consulting Physician (Family Medicine) Christene Lye, MD (General Surgery)  REFERRING PROVIDER: Theotis Burrow*  CHIEF COMPLAINTS/REASON FOR VISIT:  Evaluation of thrombocytopenia  HISTORY OF PRESENTING ILLNESS:  Daniel Ritter is a 44 y.o. male who was seen in consultation at the request of Revelo, Elyse Jarvis* for evaluation of    Reviewed patient's labs done obtained by primary care provider, record was scanned to epic. 05/01/2020 labs showed decreased platelet counts at 68,000.  White count 7.7, hemoglobin 13.3 No previous labs were available.  Associated symptoms or signs:  Denies fever, chills, fatigue, night sweats.  Positive for unintentional weight loss  denies hematochezia, hematuria, hematemesis, epistaxis, black tarry stool.  He reports completing, and easy bruising on his bilateral upper extremities. Context:  Patient reports chronic alcohol use and to 1 week ago he quitted.  He reported feeling shaky for 2 days and then he feels better.  Appetite has improved. Denies any current drug abuse.  Recently stopped smoking. He is on disability.  He is accompanied by his mother  Review of Systems  Constitutional: Positive for unexpected weight change. Negative for appetite change, chills and fever.  HENT:   Negative for hearing loss and voice change.   Eyes: Negative for eye problems and icterus.  Respiratory: Negative for chest tightness, cough and shortness of breath.   Cardiovascular: Negative for chest pain and leg swelling.  Gastrointestinal: Negative for abdominal distention and abdominal pain.  Endocrine: Negative for hot flashes.  Genitourinary: Negative for difficulty urinating, dysuria and frequency.   Musculoskeletal:  Negative for arthralgias.  Skin: Negative for itching and rash.  Neurological: Negative for light-headedness and numbness.  Hematological: Negative for adenopathy. Bruises/bleeds easily.  Psychiatric/Behavioral: Negative for confusion.    MEDICAL HISTORY:  Past Medical History:  Diagnosis Date  . Allergy    seasonal  . Anxiety   . Diabetes mellitus without complication ([place])   . Hypertension 2007  . Pancreatitis    history of, pt states due to alcohol  . Seizures ([place]) 08/2014   " two seizures in the hospital"  . Stroke [facility]) 08-31-13    SURGICAL HISTORY: Past Surgical History:  Procedure Laterality Date  . ANTERIOR CRUCIATE LIGAMENT REPAIR Left 1992, 1993  . INCISION AND DRAINAGE PERIRECTAL ABSCESS N/A 12/14/2015   Procedure: IRRIGATION AND DEBRIDEMENT PERIRECTAL ABSCESS;  Surgeon: Christene Lye, MD;  Location: [facility] ORS;  Service: General;  Laterality: N/A;    SOCIAL HISTORY: Social History   Socioeconomic History  . Marital status: Divorced    Spouse name: Not on file  . Number of children: 0  . Years of education: 78  . Highest education level: Not on file  Occupational History  . Occupation: Buyer, retail: [employer]  . Occupation: diabled  Tobacco Use  . Smoking status: Former Smoker    Packs/day: 0.25    Years: 18.00    Pack years: 4.50    Types: Cigarettes    Quit date: 04/11/2020    Years since quitting: 0.0  . Smokeless tobacco: Former Systems developer    Types: Snuff    Quit date: 2001  . Tobacco comment: patient quit for 3 years and then started back . Currently quit 3 weeks ago   Vaping Use  . Vaping Use: Never used  Substance  and Sexual Activity  . Alcohol use: Yes    Alcohol/week: 0.0 standard drinks    Comment: quit drinking about 1 week ago  . Drug use: Not Currently    Types: Marijuana    Comment: last time he had was 2 weeks ago   . Sexual activity: Not on file  Other Topics Concern  . 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 Difficulty of Paying Living Expenses: Not on file  Food Insecurity:   . Worried About Charity fundraiser in the Last Year: Not on file  . Ran Out of Food in the Last Year: Not on file  Transportation Needs:   . Lack of Transportation (Medical): Not on file  . Lack of Transportation (Non-Medical): Not on file  Physical Activity:   . Days of Exercise per Week: Not on file  . Minutes of Exercise per Session: Not on file  Stress:   . Feeling of Stress : Not on file  Social Connections:   . Frequency of Communication with Friends and Family: Not on file  . Frequency of Social Gatherings with Friends and Family: Not on file  . Attends Religious Services: Not on file  . Active Member of Clubs or Organizations: Not on file  . Attends Archivist Meetings: Not on file  . Marital Status: Not on file  Intimate Partner Violence:   . Fear of Current or Ex-Partner: Not on file  . Emotionally Abused: Not on file  . Physically Abused: Not on file  . Sexually Abused: Not on file    FAMILY HISTORY: Family History  Problem Relation Age of Onset  . Hypertension Mother   . Anxiety disorder Mother   . Hypertension Father   . Hypertension Brother   . Leukemia Maternal Grandfather   . Lung cancer Paternal Grandfather     ALLERGIES:  has No Known Allergies.  MEDICATIONS:  Current Outpatient Medications  Medication Sig Dispense Refill  . gabapentin (NEURONTIN) 400 MG capsule Take 400 mg by mouth 3 (three) times daily. 1-2 by mouth 3 times daily    . JANUVIA 100 MG tablet Take 100 mg by mouth daily.    Marland Kitchen levETIRAcetam (KEPPRA) 500 MG tablet Take 1 tablet (500 mg total) by mouth 2 (two) times daily. 30 tablet 2  . lisinopril-hydrochlorothiazide (PRINZIDE,ZESTORETIC) 20-12.5 MG per tablet Take  1 tablet by mouth daily.    . metFORMIN (GLUCOPHAGE) 1000 MG tablet Take 1,000 mg by mouth 2 (two) times daily with a meal.    . metoprolol succinate (TOPROL-XL) 100 MG 24 hr tablet Take 100 mg by mouth at bedtime. Take with or immediately following a meal.    . potassium chloride (MICRO-K) 10 MEQ CR capsule Take 10 mEq by mouth daily.    . Vitamin D, Ergocalciferol, (DRISDOL) 1.25 MG (50000 UNIT) CAPS capsule Take 50,000 Units by mouth every 7 (seven) days.    . ALPRAZolam (XANAX) 1 MG tablet Take 1 mg by mouth at bedtime as needed.  (Patient not taking: Reported on 05/02/2020)  1   No current facility-administered medications for this visit.     PHYSICAL EXAMINATION: ECOG PERFORMANCE STATUS: 0 - Asymptomatic Vitals:   05/02/20 1509  BP: 130/87  Pulse: 94  Resp:  18  Temp: 99.4 F (37.4 C)   Filed Weights   05/02/20 1509  Weight: 201 lb 11.2 oz (91.5 kg)    Physical Exam Constitutional:      General: He is not in acute distress. HENT:     Head: Normocephalic and atraumatic.     Mouth/Throat:     Comments: Poor oral hygiene Eyes:     General: No scleral icterus. Cardiovascular:     Rate and Rhythm: Normal rate and regular rhythm.     Heart sounds: Normal heart sounds.  Pulmonary:     Effort: Pulmonary effort is normal. No respiratory distress.     Breath sounds: No wheezing.  Abdominal:     General: Bowel sounds are normal. There is no distension.     Palpations: Abdomen is soft.  Musculoskeletal:        General: No deformity. Normal range of motion.     Cervical back: Normal range of motion and neck supple.  Skin:    General: Skin is warm and dry.     Findings: Bruising present. No erythema or rash.  Neurological:     Mental Status: He is alert and oriented to person, place, and time. Mental status is at baseline.     Cranial Nerves: No cranial nerve deficit.     Coordination: Coordination normal.  Psychiatric:        Mood and Affect: Mood normal.       LABORATORY DATA:  I have reviewed the data as listed Lab Results  Component Value Date   WBC 6.8 05/02/2020   HGB 13.3 05/02/2020   HCT 38.4 (L) 05/02/2020   MCV 91.4 05/02/2020   PLT 108 (L) 05/02/2020   Recent Labs    05/02/20 1521  NA 136  K 4.0  CL 99  CO2 26  GLUCOSE 146*  BUN 9  CREATININE 0.56*  CALCIUM 9.7  GFRNONAA >60  PROT 8.9*  ALBUMIN 4.5  AST 73*  ALT 83*  ALKPHOS 89  BILITOT 1.7*   Iron/TIBC/Ferritin/ %Sat    Component Value Date/Time   IRON 81 10/10/2011 1407   TIBC 360 10/10/2011 1407   FERRITIN 513 (H) 10/10/2011 1407   IRONPCTSAT 23 10/10/2011 1407      RADIOGRAPHIC STUDIES: I have personally reviewed the radiological images as listed and agreed with the findings in the report.  No results found.   ASSESSMENT & PLAN:  1. Thrombocytopenia ([place])   2. History of alcohol use   3. Transaminitis   4. Easy bruising    #May be due to chronic inflammation/infection, medication, alcohol, autoimmune disease, hypersplenism, underlying bone marrow disorders. For the work up of patient's thrombocytopenia, I recommend checking CBC;CMP, LDH; smear review, folate, Vitamin B12, hepatitis, HIV, ANA,  flowcytometry and monoclonal gammopathy workup. Will also check ultrasound of the abdomen.  #History of chronic alcohol use, easy bruising, pending above work-up.  Check PT and PTT. #Transaminitis, check abdomen ultrasound. # Patient follow-up with me in approximately 2 weeks to review the above results.   Orders Placed This Encounter  Procedures  . US Abdomen Complete    Standing Status:   Future    Standing Expiration Date:   05/02/2021    Order Specific Question:   Reason for Exam (SYMPTOM  OR DIAGNOSIS REQUIRED)    Answer:   thrombocytopenia    Order Specific Question:   Preferred imaging location?    Answer:   [facility]  . CBC with Differential/Platelet  Standing Status:   Future    Number of Occurrences:   1    Standing  Expiration Date:   05/02/2021  . Comprehensive metabolic panel    Standing Status:   Future    Number of Occurrences:   1    Standing Expiration Date:   05/02/2021  . ANA, IFA (with reflex)    Standing Status:   Future    Number of Occurrences:   1    Standing Expiration Date:   05/02/2021  . Immature Platelet Fraction    Standing Status:   Future    Number of Occurrences:   1    Standing Expiration Date:   05/02/2021  . Flow cytometry panel-leukemia/lymphoma work-up    Standing Status:   Future    Number of Occurrences:   1    Standing Expiration Date:   05/02/2021  . Hepatitis panel, acute    Standing Status:   Future    Number of Occurrences:   1    Standing Expiration Date:   05/02/2021  . HIV Antibody (routine testing w rflx)    Standing Status:   Future    Number of Occurrences:   1    Standing Expiration Date:   05/02/2021  . Lactate dehydrogenase    Standing Status:   Future    Number of Occurrences:   1    Standing Expiration Date:   05/02/2021  . Vitamin B12    Standing Status:   Future    Number of Occurrences:   1    Standing Expiration Date:   05/02/2021  . Folate    Standing Status:   Future    Number of Occurrences:   1    Standing Expiration Date:   05/02/2021  . Pathologist smear review    Standing Status:   Future    Number of Occurrences:   1    Standing Expiration Date:   05/02/2021  . Multiple Myeloma Panel (SPEP&IFE w/QIG)    Standing Status:   Future    Number of Occurrences:   1    Standing Expiration Date:   05/02/2021  . Protime-INR    Standing Status:   Future    Number of Occurrences:   1    Standing Expiration Date:   05/02/2021  . APTT    Standing Status:   Future    Number of Occurrences:   1    Standing Expiration Date:   05/02/2021    All questions were answered. The patient knows to call the clinic with any problems questions or concerns.  Cc Revelo, Elyse Jarvis*  Return of visit: 2 weeks.  Thank you for this kind  referral and the opportunity to participate in the care of this patient. A copy of today's note is routed to referring provider    Earlie Server, MD, PhD 05/02/2020

## 2020-05-02 NOTE — Progress Notes (Signed)
Patient here to establish care for thrombocytopenia. Pt reports easy bruising and bleeding. Pt would like to know if Dr. Tasia Catchings can refill xanax for him.

## 2020-05-04 LAB — MULTIPLE MYELOMA PANEL, SERUM
Albumin SerPl Elph-Mcnc: 3.6 g/dL (ref 2.9–4.4)
Albumin/Glob SerPl: 1 (ref 0.7–1.7)
Alpha 1: 0.3 g/dL (ref 0.0–0.4)
Alpha2 Glob SerPl Elph-Mcnc: 1 g/dL (ref 0.4–1.0)
B-Globulin SerPl Elph-Mcnc: 1.5 g/dL — ABNORMAL HIGH (ref 0.7–1.3)
Gamma Glob SerPl Elph-Mcnc: 1.1 g/dL (ref 0.4–1.8)
Globulin, Total: 3.9 g/dL (ref 2.2–3.9)
IgA: 404 mg/dL — ABNORMAL HIGH (ref 90–386)
IgG (Immunoglobin G), Serum: 1104 mg/dL (ref 603–1613)
IgM (Immunoglobulin M), Srm: 56 mg/dL (ref 20–172)
Total Protein ELP: 7.5 g/dL (ref 6.0–8.5)

## 2020-05-04 LAB — ANTINUCLEAR ANTIBODIES, IFA: ANA Ab, IFA: NEGATIVE

## 2020-05-07 LAB — COMP PANEL: LEUKEMIA/LYMPHOMA

## 2020-05-09 ENCOUNTER — Encounter: Payer: Self-pay | Admitting: Oncology

## 2020-05-09 ENCOUNTER — Other Ambulatory Visit: Payer: Self-pay

## 2020-05-11 ENCOUNTER — Other Ambulatory Visit: Payer: Self-pay

## 2020-05-11 ENCOUNTER — Ambulatory Visit
Admission: RE | Admit: 2020-05-11 | Discharge: 2020-05-11 | Disposition: A | Discharge status: Home / Self Care | Payer: Medicaid Other | Source: Ambulatory Visit | Attending: Oncology | Admitting: Oncology

## 2020-05-11 DIAGNOSIS — D696 Thrombocytopenia, unspecified: Secondary | ICD-10-CM | POA: Diagnosis not present

## 2020-05-16 ENCOUNTER — Inpatient Hospital Stay: Payer: Medicaid Other | Attending: Oncology | Admitting: Oncology

## 2020-05-16 ENCOUNTER — Encounter: Payer: Self-pay | Admitting: Oncology

## 2020-05-16 ENCOUNTER — Other Ambulatory Visit: Payer: Self-pay

## 2020-05-16 VITALS — BP 138/98 | HR 94 | Temp 99.6°F | Resp 18 | Wt 204.3 lb

## 2020-05-16 DIAGNOSIS — F419 Anxiety disorder, unspecified: Secondary | ICD-10-CM | POA: Insufficient documentation

## 2020-05-16 DIAGNOSIS — R161 Splenomegaly, not elsewhere classified: Secondary | ICD-10-CM | POA: Insufficient documentation

## 2020-05-16 DIAGNOSIS — Z87898 Personal history of other specified conditions: Secondary | ICD-10-CM | POA: Diagnosis not present

## 2020-05-16 DIAGNOSIS — Z7984 Long term (current) use of oral hypoglycemic drugs: Secondary | ICD-10-CM | POA: Diagnosis not present

## 2020-05-16 DIAGNOSIS — I10 Essential (primary) hypertension: Secondary | ICD-10-CM | POA: Insufficient documentation

## 2020-05-16 DIAGNOSIS — Z79899 Other long term (current) drug therapy: Secondary | ICD-10-CM | POA: Diagnosis not present

## 2020-05-16 DIAGNOSIS — R634 Abnormal weight loss: Secondary | ICD-10-CM | POA: Insufficient documentation

## 2020-05-16 DIAGNOSIS — Z8673 Personal history of transient ischemic attack (TIA), and cerebral infarction without residual deficits: Secondary | ICD-10-CM | POA: Insufficient documentation

## 2020-05-16 DIAGNOSIS — E119 Type 2 diabetes mellitus without complications: Secondary | ICD-10-CM | POA: Diagnosis not present

## 2020-05-16 DIAGNOSIS — D696 Thrombocytopenia, unspecified: Secondary | ICD-10-CM | POA: Diagnosis not present

## 2020-05-16 DIAGNOSIS — Z87891 Personal history of nicotine dependence: Secondary | ICD-10-CM | POA: Insufficient documentation

## 2020-05-16 NOTE — Progress Notes (Signed)
Hematology/Oncology Consult note [facility] Telephone:([phone] [phone]   Patient Care Team: Alene Mires Elyse Jarvis, MD as PCP - General (Family Medicine) Karen Kitchens, MD as Consulting Physician (Family Medicine) Christene Lye, MD (General Surgery)  REFERRING PROVIDER: Theotis Burrow*  CHIEF COMPLAINTS/REASON FOR VISIT:  Evaluation of thrombocytopenia  HISTORY OF PRESENTING ILLNESS:  Daniel Ritter is a 44 y.o. male who was seen in consultation at the request of Revelo, Elyse Jarvis* for evaluation of    Reviewed patient's labs done obtained by primary care provider, record was scanned to epic. 05/01/2020 labs showed decreased platelet counts at 68,000.  White count 7.7, hemoglobin 13.3 No previous labs were available.  Associated symptoms or signs:  Denies fever, chills, fatigue, night sweats.  Positive for unintentional weight loss  denies hematochezia, hematuria, hematemesis, epistaxis, black tarry stool.  He reports completing, and easy bruising on his bilateral upper extremities. Context:  Patient reports chronic alcohol use and to 1 week ago he quitted.  He reported feeling shaky for 2 days and then he feels better.  Appetite has improved. Denies any current drug abuse.  Recently stopped smoking. He is on disability.  He is accompanied by his mother    INTERVAL HISTORY Daniel Ritter is a 44 y.o. male who has above history reviewed by me today presents for follow up visit for thrombocytopenia.  Problems and complaints are listed below: He had blood work up done during the interval and presents to discuss results.  He has stopped alcohol use for 3-4 weeks. Gum bleeding has improved. He feels better.  He has also gained weight.   Review of Systems  Constitutional: Negative for appetite change, chills, fever and unexpected weight change.  HENT:   Negative for hearing loss and voice change.   Eyes:  Negative for eye problems and icterus.  Respiratory: Negative for chest tightness, cough and shortness of breath.   Cardiovascular: Negative for chest pain and leg swelling.  Gastrointestinal: Negative for abdominal distention and abdominal pain.  Endocrine: Negative for hot flashes.  Genitourinary: Negative for difficulty urinating, dysuria and frequency.   Musculoskeletal: Negative for arthralgias.  Skin: Negative for itching and rash.  Neurological: Negative for light-headedness and numbness.  Hematological: Negative for adenopathy. Bruises/bleeds easily.  Psychiatric/Behavioral: Negative for confusion.    MEDICAL HISTORY:  Past Medical History:  Diagnosis Date  . Allergy    seasonal  . Anxiety   . Diabetes mellitus without complication ([place])   . Hypertension 2007  . Pancreatitis    history of, pt states due to alcohol  . Seizures ([place]) 08/2014   " two seizures in the hospital"  . Stroke ([place]) 08-31-13  . Thrombocytopenia ([place])     SURGICAL HISTORY: Past Surgical History:  Procedure Laterality Date  . ANTERIOR CRUCIATE LIGAMENT REPAIR Left 1992, 1993  . INCISION AND DRAINAGE PERIRECTAL ABSCESS N/A 12/14/2015   Procedure: IRRIGATION AND DEBRIDEMENT PERIRECTAL ABSCESS;  Surgeon: Christene Lye, MD;  Location: [facility] ORS;  Service: General;  Laterality: N/A;    SOCIAL HISTORY: Social History   Socioeconomic History  . Marital status: Divorced    Spouse name: Not on file  . Number of children: 0  . Years of education: 81  . Highest education level: Not on file  Occupational History  . Occupation: Buyer, retail: [employer]  . Occupation: diabled  Tobacco Use  . Smoking status: Former Smoker    Packs/day: 0.25  Years: 18.00    Pack years: 4.50    Types: Cigarettes    Quit date: 04/11/2020    Years since quitting: 0.0  . Smokeless tobacco: Former Systems developer    Types: Snuff    Quit date: 2001  . Tobacco comment: patient quit for 3 years and then  started back . Currently quit 3 weeks ago   Vaping Use  . Vaping Use: Never used  Substance and Sexual Activity  . Alcohol use: Yes    Alcohol/week: 0.0 standard drinks    Comment: quit drinking about 1 week ago  . Drug use: Not Currently    Types: Marijuana    Comment: last time he had was 2 weeks ago   . Sexual activity: Not on file  Other Topics Concern  . 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 Difficulty of Paying Living Expenses: Not on file  Food Insecurity:   . Worried About Charity fundraiser in the Last Year: Not on file  . Ran Out of Food in the Last Year: Not on file  Transportation Needs:   . Lack of Transportation (Medical): Not on file  . Lack of Transportation (Non-Medical): Not on file  Physical Activity:   . Days of Exercise per Week: Not on file  . Minutes of Exercise per Session: Not on file  Stress:   . Feeling of Stress : Not on file  Social Connections:   . Frequency of Communication with Friends and Family: Not on file  . Frequency of Social Gatherings with Friends and Family: Not on file  . Attends Religious Services: Not on file  . Active Member of Clubs or Organizations: Not on file  . Attends Archivist Meetings: Not on file  . Marital Status: Not on file  Intimate Partner Violence:   . Fear of Current or Ex-Partner: Not on file  . Emotionally Abused: Not on file  . Physically Abused: Not on file  . Sexually Abused: Not on file    FAMILY HISTORY: Family History  Problem Relation Age of Onset  . Hypertension Mother   . Anxiety disorder Mother   . Hypertension Father   . Hypertension Brother   . Leukemia Maternal Grandfather   . Lung cancer Paternal Grandfather     ALLERGIES:  has No Known Allergies.  MEDICATIONS:  Current  Outpatient Medications  Medication Sig Dispense Refill  . ferrous sulfate 325 (65 FE) MG tablet Take 325 mg by mouth 2 (two) times daily with a meal.    . gabapentin (NEURONTIN) 800 MG tablet Take 400 mg by mouth 3 (three) times daily. 1-2 by mouth 3 times daily     . JANUVIA 100 MG tablet Take 100 mg by mouth daily.    Marland Kitchen levETIRAcetam (KEPPRA) 500 MG tablet Take 1 tablet (500 mg total) by mouth 2 (two) times daily. 30 tablet 2  . lisinopril-hydrochlorothiazide (PRINZIDE,ZESTORETIC) 20-12.5 MG per tablet Take 1 tablet by mouth daily.    . metFORMIN (GLUCOPHAGE) 1000 MG tablet Take 1,000 mg by mouth 2 (two) times daily with a meal.    . metoprolol succinate (TOPROL-XL) 100 MG 24 hr tablet Take 100 mg by mouth at bedtime. Take with or immediately following a meal.    .  potassium chloride (MICRO-K) 10 MEQ CR capsule Take 10 mEq by mouth daily.    . vitamin B-12 (CYANOCOBALAMIN) 250 MCG tablet Take 250 mcg by mouth in the morning and at bedtime.    . Vitamin D, Ergocalciferol, (DRISDOL) 1.25 MG (50000 UNIT) CAPS capsule Take 50,000 Units by mouth every 7 (seven) days.    . ALPRAZolam (XANAX) 1 MG tablet Take 1 mg by mouth at bedtime as needed.  (Patient not taking: Reported on 05/02/2020)  1   No current facility-administered medications for this visit.     PHYSICAL EXAMINATION: ECOG PERFORMANCE STATUS: 0 - Asymptomatic Vitals:   05/16/20 1418  BP: (!) 138/98  Pulse: 94  Resp: 18  Temp: 99.6 F (37.6 C)   Filed Weights   05/16/20 1418  Weight: 204 lb 4.8 oz (92.7 kg)    Physical Exam Constitutional:      General: He is not in acute distress. HENT:     Head: Normocephalic and atraumatic.     Mouth/Throat:     Comments: Poor oral hygiene Eyes:     General: No scleral icterus. Cardiovascular:     Rate and Rhythm: Normal rate and regular rhythm.     Heart sounds: Normal heart sounds.  Pulmonary:     Effort: Pulmonary effort is normal. No respiratory distress.     Breath sounds:  No wheezing.  Abdominal:     General: Bowel sounds are normal. There is no distension.     Palpations: Abdomen is soft.  Musculoskeletal:        General: No deformity. Normal range of motion.     Cervical back: Normal range of motion and neck supple.  Skin:    General: Skin is warm and dry.     Findings: No bruising, erythema or rash.  Neurological:     Mental Status: He is alert and oriented to person, place, and time. Mental status is at baseline.     Cranial Nerves: No cranial nerve deficit.     Coordination: Coordination normal.  Psychiatric:        Mood and Affect: Mood normal.      LABORATORY DATA:  I have reviewed the data as listed Lab Results  Component Value Date   WBC 6.8 05/02/2020   HGB 13.3 05/02/2020   HCT 38.4 (L) 05/02/2020   MCV 91.4 05/02/2020   PLT 108 (L) 05/02/2020   Recent Labs    05/02/20 1521  NA 136  K 4.0  CL 99  CO2 26  GLUCOSE 146*  BUN 9  CREATININE 0.56*  CALCIUM 9.7  GFRNONAA >60  PROT 8.9*  ALBUMIN 4.5  AST 73*  ALT 83*  ALKPHOS 89  BILITOT 1.7*   Iron/TIBC/Ferritin/ %Sat    Component Value Date/Time   IRON 81 10/10/2011 1407   TIBC 360 10/10/2011 1407   FERRITIN 513 (H) 10/10/2011 1407   IRONPCTSAT 23 10/10/2011 1407      RADIOGRAPHIC STUDIES: I have personally reviewed the radiological images as listed and agreed with the findings in the report.  US Abdomen Complete  Result Date: 05/12/2020 CLINICAL DATA:  Thrombocytopenia EXAM: ABDOMEN ULTRASOUND COMPLETE COMPARISON:  None. FINDINGS: Gallbladder: No gallstones or wall thickening visualized. No sonographic Murphy sign noted by sonographer. Common bile duct: Diameter: 0.5 cm, within normal limits Liver: No focal lesion identified. Diffusely increased liver parenchymal echogenicity. Portal vein is patent on color Doppler imaging with normal direction of blood flow towards the liver. IVC: No abnormality visualized. Pancreas: Visualized  portion unremarkable. Spleen:  Enlarged measuring 14 cm in length with a volume of 660 cc. Right Kidney: Length: 12.9 cm. Echogenicity within normal limits. No mass or hydronephrosis visualized. Left Kidney: Length: 12.7 cm. Echogenicity within normal limits. No mass or hydronephrosis visualized. Abdominal aorta: Proximal aorta measures 3.0 cm in diameter. Other findings: None. IMPRESSION: 1.  Splenomegaly. 2.  Ectasia of the proximal abdominal aorta. Electronically Signed   By: Audie Pinto M.D.   On: 05/12/2020 17:09     ASSESSMENT & PLAN:  1. Thrombocytopenia ([place])   2. History of alcohol use   3. Splenomegaly    #Thrombocytopenia,  Labs are reviewed and discussed with patient. Adequate B12, folate levels, negative peripheral blood flowcytometry, negative hepatitis, HIV, neg ANA.  US abdomen showed increased liver echogenecity, splenomegaly. I will refer him to see Dr.Anna for further evaluation. Probably chronic liver disease due to alcohol use.   # Easy bruising, gum bleeding have improved. Normal PT, PTT. Monitor.  # Splenomegaly, pending GI work up .   # Patient follow-up with me in approximately 2-3 months. .   Orders Placed This Encounter  Procedures  . CBC with Differential/Platelet    Standing Status:   Future    Standing Expiration Date:   05/16/2021  . Ambulatory referral to Gastroenterology    Referral Priority:   Routine    Referral Type:   Consultation    Referral Reason:   Specialty Services Required    Number of Visits Requested:   1    All questions were answered. The patient knows to call the clinic with any problems questions or concerns.  Earlie Server, MD, PhD 05/16/2020

## 2020-05-16 NOTE — Progress Notes (Signed)
Patient denies new problems/concerns today.   °

## 2020-07-09 ENCOUNTER — Ambulatory Visit: Payer: Medicaid Other | Admitting: Gastroenterology

## 2020-07-16 ENCOUNTER — Telehealth: Payer: Self-pay | Admitting: Oncology

## 2020-07-16 NOTE — Telephone Encounter (Signed)
Pt called to cancel appointment on 07/17/20 would like a return call to reschedule. [phone]

## 2020-07-16 NOTE — Telephone Encounter (Signed)
FYI..  Pt stated that he would contact the office back at a later date to have 07/17/20 lab/MD appts R/S.

## 2020-07-17 ENCOUNTER — Inpatient Hospital Stay: Payer: Medicaid Other

## 2020-07-17 ENCOUNTER — Inpatient Hospital Stay: Payer: Medicaid Other | Admitting: Oncology

## 2020-09-17 ENCOUNTER — Encounter: Payer: Self-pay | Admitting: Gastroenterology

## 2020-09-17 ENCOUNTER — Ambulatory Visit (INDEPENDENT_AMBULATORY_CARE_PROVIDER_SITE_OTHER): Payer: Medicaid Other | Admitting: Gastroenterology

## 2020-09-17 ENCOUNTER — Other Ambulatory Visit: Payer: Self-pay

## 2020-09-17 VITALS — BP 148/85 | HR 105 | Ht 72.0 in | Wt 222.0 lb

## 2020-09-17 DIAGNOSIS — D696 Thrombocytopenia, unspecified: Secondary | ICD-10-CM

## 2020-09-17 DIAGNOSIS — K219 Gastro-esophageal reflux disease without esophagitis: Secondary | ICD-10-CM

## 2020-09-17 DIAGNOSIS — Z1211 Encounter for screening for malignant neoplasm of colon: Secondary | ICD-10-CM | POA: Diagnosis not present

## 2020-09-17 DIAGNOSIS — K769 Liver disease, unspecified: Secondary | ICD-10-CM | POA: Diagnosis not present

## 2020-09-17 MED ORDER — NA SULFATE-K SULFATE-MG SULF 17.5-3.13-1.6 GM/177ML PO SOLN: 1.0000 | Freq: Once | ORAL | 0 refills | Status: AC

## 2020-09-17 NOTE — Patient Instructions (Signed)
Ultrasound Elastography Liver has been scheduled for March 31 @ 9:30am. You will need to report to [facility] entrance at 9:15am. Nothing to eat or drink after midnight the night before. If you need to reschedule, please contact central scheduling at [phone].

## 2020-09-17 NOTE — Progress Notes (Unsigned)
Daniel Bellows MD, MRCP(U.K) [address]  [place]  [place], [place] [address]  Main: [phone]  Fax: [phone]   Gastroenterology Consultation  Referring Provider:     Earlie Server, MD Primary Care Physician:  Daniel Burrow, MD Primary Gastroenterologist:  Dr. Jonathon Ritter  Reason for Consultation:     Alcoholic liver cirrhosis        HPI:   Daniel Ritter is a 45 y.o. 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   05/12/2020: Right upper quadrant ultrasound shows splenomegaly. 05/02/2020: Hemoglobin 13.3 g with a platelet count of 108, INR 1.1, albumin 4.5 AST of 73 ALT 83.  Acute hepatitis panel negative hepatitis C virus antibody as well as hepatitis B surface antigen, B12 normal. Seen by hematology at Dr. Collie Ritter office for thrombocytopenia.  He states that he has consumed alcohol for many years almost half a bottle a week of spirits and quit in October.  He has been a smoker in the past which also he has quit.  Denies any family history of liver disease.  No tattoos.  He does have a history of incarceration.  No Armed forces logistics/support/administrative officer.  He has experimented with drugs on 1 or 2 occasions many many years ago.  No other complaints.  He does complain of regurgitation every time he lays flat.  Never had a colonoscopy no family of colon cancer or polyps Past Medical History:  Diagnosis Date  . Allergy    seasonal  . Anxiety   . Diabetes mellitus without complication ([place])   . Hypertension 2007  . Pancreatitis    history of, pt states due to alcohol  . Seizures ([place]) 08/2014   " two seizures in the hospital"  . Stroke ([place]) 08-31-13  . Thrombocytopenia ([place])     Past Surgical History:  Procedure Laterality Date  . ANTERIOR CRUCIATE LIGAMENT REPAIR Left 1992, 1993  . INCISION AND DRAINAGE PERIRECTAL ABSCESS N/A 12/14/2015   Procedure: IRRIGATION AND DEBRIDEMENT PERIRECTAL ABSCESS;  Surgeon: Daniel Lye, MD;  Location: [facility] ORS;  Service: General;   Laterality: N/A;    Prior to Admission medications   Medication Sig Start Date End Date Taking? Authorizing Provider  ALPRAZolam Daniel Ritter) 1 MG tablet Take 1 mg by mouth at bedtime as needed.  Patient not taking: Reported on 05/02/2020 10/15/14   [provider]  ferrous sulfate 325 (65 FE) MG tablet Take 325 mg by mouth 2 (two) times daily with a meal.    [provider]  gabapentin (NEURONTIN) 800 MG tablet Take 400 mg by mouth 3 (three) times daily. 1-2 by mouth 3 times daily     [provider]  JANUVIA 100 MG tablet Take 100 mg by mouth daily. 04/30/20   [provider]  levETIRAcetam (KEPPRA) 500 MG tablet Take 1 tablet (500 mg total) by mouth 2 (two) times daily. 09/04/14   Donzetta Starch, NP  lisinopril-hydrochlorothiazide (PRINZIDE,ZESTORETIC) 20-12.5 MG per tablet Take 1 tablet by mouth daily.    [provider]  metFORMIN (GLUCOPHAGE) 1000 MG tablet Take 1,000 mg by mouth 2 (two) times daily with a meal.    [provider]  metoprolol succinate (TOPROL-XL) 100 MG 24 hr tablet Take 100 mg by mouth at bedtime. Take with or immediately following a meal.    [provider]  potassium chloride (MICRO-K) 10 MEQ CR capsule Take 10 mEq by mouth daily.    [provider]  vitamin  B-12 (CYANOCOBALAMIN) 250 MCG tablet Take 250 mcg by mouth in the morning and at bedtime.    [provider]  Vitamin D, Ergocalciferol, (DRISDOL) 1.25 MG (50000 UNIT) CAPS capsule Take 50,000 Units by mouth every 7 (seven) days.    [provider]    Family History  Problem Relation Age of Onset  . Hypertension Mother   . Anxiety disorder Mother   . Hypertension Father   . Hypertension Brother   . Leukemia Maternal Grandfather   . Lung cancer Paternal Grandfather      Social History   Tobacco Use  . Smoking status: Former Smoker    Packs/day: 0.25    Years: 18.00    Pack years: 4.50    Types: Cigarettes    Quit  date: 04/11/2020    Years since quitting: 0.4  . Smokeless tobacco: Former Systems developer    Types: Snuff    Quit date: 2001  . Tobacco comment: patient quit for 3 years and then started back . Currently quit 3 weeks ago   Vaping Use  . Vaping Use: Never used  Substance Use Topics  . Alcohol use: Yes    Alcohol/week: 0.0 standard drinks    Comment: quit drinking about 1 week ago  . Drug use: Not Currently    Types: Marijuana    Comment: last time he had was 2 weeks ago     Allergies as of 09/17/2020  . (No Known Allergies)    Review of Systems:    All systems reviewed and negative except where noted in HPI.   Physical Exam:  There were no vitals taken for this visit. No LMP for male patient. Psych:  Alert and cooperative. Normal mood and affect. General:   Alert,  Well-developed, well-nourished, pleasant and cooperative in NAD Head:  Normocephalic and atraumatic. Eyes:  Sclera clear, no icterus.   Conjunctiva pink. Ears:  Normal auditory acuity. Lungs: Features of spider angiomas over his back respirations even and unlabored.  Clear throughout to auscultation.   No wheezes, crackles, or rhonchi. No acute distress. Heart:  Regular rate and rhythm; no murmurs, clicks, rubs, or gallops. Abdomen:  Normal bowel sounds.  No bruits.  Soft, non-tender and non-distended without masses, hepatosplenomegaly or hernias noted.  No guarding or rebound tenderness.    Neurologic:  Alert and oriented x3;  grossly normal neurologically. Psych:  Alert and cooperative. Normal mood and affect.  Imaging Studies: No results found.  Assessment and Plan:   Daniel Ritter is a 45 y.o. y/o male has been referred for evaluation of chronic liver disease secondary to alcohol intake.  Patient being evaluated by Dr. Tasia Catchings in hematology for thrombocytopenia.  Not had a colonoscopy for colon cancer screening as well.  On examination he does have features of chronic liver disease with spider angiomas over his back and his  splenomegaly is likely due to portal hypertension.  Due for a screening colonoscopy.  Plan 1.  Liver elastography to determine if he has significant fibrosis 2.  Complete autoimmune and viral hepatitis work-up. 3.  If he has cirrhosis of the liver will consider EGD. 4.  Suggest to completely stop drinking all alcohol which he has and I congratulated him about the same. 5.  GERD discussed about lifestyle changes including weight loss, raising the head end of the bed with a wedge pillow, picture of a wedge pillow provided.  Patient information on conservative management and lifestyle changes for acid reflux provided.  Advised to  consume meals small in quantity more often and at least 2 hours before bedtime.  Commence on Prilosec 40 mg once a day first thing in the morning on an empty stomach  I have discussed alternative options, risks & benefits,  which include, but are not limited to, bleeding, infection, perforation,respiratory complication & drug reaction.  The patient agrees with this plan & written consent will be obtained.     Follow up in 8 weeks  Dr Daniel Bellows MD,MRCP(U.K)

## 2020-09-25 ENCOUNTER — Other Ambulatory Visit: Payer: Medicaid Other | Attending: Gastroenterology

## 2020-09-26 ENCOUNTER — Other Ambulatory Visit
Admission: RE | Admit: 2020-09-26 | Discharge: 2020-09-26 | Disposition: A | Discharge status: Home / Self Care | Payer: Medicaid Other | Source: Ambulatory Visit | Attending: Gastroenterology | Admitting: Gastroenterology

## 2020-09-26 ENCOUNTER — Encounter: Payer: Self-pay | Admitting: Gastroenterology

## 2020-09-26 ENCOUNTER — Other Ambulatory Visit: Payer: Self-pay

## 2020-09-26 DIAGNOSIS — Z20822 Contact with and (suspected) exposure to covid-19: Secondary | ICD-10-CM | POA: Insufficient documentation

## 2020-09-26 DIAGNOSIS — Z01812 Encounter for preprocedural laboratory examination: Secondary | ICD-10-CM | POA: Diagnosis not present

## 2020-09-26 LAB — SARS CORONAVIRUS 2 (TAT 6-24 HRS): SARS Coronavirus 2: NEGATIVE

## 2020-09-27 ENCOUNTER — Encounter: Payer: Self-pay | Admitting: Certified Registered Nurse Anesthetist

## 2020-09-27 ENCOUNTER — Encounter: Admission: RE | Payer: Self-pay | Source: Home / Self Care

## 2020-09-27 ENCOUNTER — Ambulatory Visit: Admission: RE | Admit: 2020-09-27 | Payer: Medicaid Other | Source: Home / Self Care | Admitting: Gastroenterology

## 2020-09-27 SURGERY — COLONOSCOPY WITH PROPOFOL
Anesthesia: General

## 2020-10-04 ENCOUNTER — Ambulatory Visit: Payer: Medicaid Other | Attending: Gastroenterology

## 2020-11-12 ENCOUNTER — Telehealth: Payer: Self-pay

## 2020-11-12 ENCOUNTER — Ambulatory Visit: Payer: Medicaid Other | Admitting: Gastroenterology

## 2020-11-12 ENCOUNTER — Encounter: Payer: Self-pay | Admitting: Gastroenterology

## 2020-11-12 NOTE — Telephone Encounter (Signed)
Called to inform patient per Vicente Males, he does not need to be seen today. This visit was a follow up from procedure and ultrasound. His procedure was canceled and imaging was never done. LVM to call office back.

## 2020-11-12 NOTE — Progress Notes (Incomplete)
[name] MD, MRCP(U.K) [address]  [place]  [place], [place] [address]  Main: [phone]  Fax: [phone]   Primary Care Physician: Theotis Burrow, MD  Primary Gastroenterologist:  Dr. [name]   Liver cirrhosis follow-up  HPI: Daniel Ritter is a 45 y.o. male    Summary of history :  Initially referred and seen on 0/73/7106 for alcoholic liver cirrhosis. He has consumed alcohol for many years almost half a bottle a week of spirits and quit in October 2021. He has experimented with drugs on 1 or 2 occasions many many years ago  05/12/2020: Right upper quadrant ultrasound shows splenomegaly. 05/02/2020: Hemoglobin 13.3 g with a platelet count of 108, INR 1.1, albumin 4.5 AST of 73 ALT 83.  Acute hepatitis panel negative hepatitis C virus antibody as well as hepatitis B surface antigen, B12 normal. Seen by hematology at Dr. Collie Siad office for thrombocytopenia.    Interval history   ***/***/2020-  ***/***/2020  ***   Current Outpatient Medications  Medication Sig Dispense Refill  . ALPRAZolam (XANAX) 1 MG tablet Take 1 mg by mouth at bedtime as needed.  1  . ferrous sulfate 325 (65 FE) MG tablet Take 325 mg by mouth 2 (two) times daily with a meal.    . gabapentin (NEURONTIN) 800 MG tablet Take 400 mg by mouth 3 (three) times daily. 1-2 by mouth 3 times daily    . JANUVIA 100 MG tablet Take 100 mg by mouth daily.    Marland Kitchen levETIRAcetam (KEPPRA) 500 MG tablet Take 1 tablet (500 mg total) by mouth 2 (two) times daily. 30 tablet 2  . lisinopril-hydrochlorothiazide (PRINZIDE,ZESTORETIC) 20-12.5 MG per tablet Take 1 tablet by mouth daily.    . metFORMIN (GLUCOPHAGE) 1000 MG tablet Take 1,000 mg by mouth 2 (two) times daily with a meal.    . metoprolol succinate (TOPROL-XL) 100 MG 24 hr tablet Take 100 mg by mouth at bedtime. Take with or immediately following a meal.    . potassium chloride (MICRO-K) 10 MEQ CR capsule Take 10 mEq by mouth daily.    . vitamin  B-12 (CYANOCOBALAMIN) 250 MCG tablet Take 250 mcg by mouth in the morning and at bedtime.    . Vitamin D, Ergocalciferol, (DRISDOL) 1.25 MG (50000 UNIT) CAPS capsule Take 50,000 Units by mouth every 7 (seven) days.     No current facility-administered medications for this visit.    Allergies as of 11/12/2020  . (No Known Allergies)    ROS:  General: Negative for anorexia, weight loss, fever, chills, fatigue, weakness. ENT: Negative for hoarseness, difficulty swallowing , nasal congestion. CV: Negative for chest pain, angina, palpitations, dyspnea on exertion, peripheral edema.  Respiratory: Negative for dyspnea at rest, dyspnea on exertion, cough, sputum, wheezing.  GI: See history of present illness. GU:  Negative for dysuria, hematuria, urinary incontinence, urinary frequency, nocturnal urination.  Endo: Negative for unusual weight change.    Physical Examination:   There were no vitals taken for this visit.  General: Well-nourished, well-developed in no acute distress.  Eyes: No icterus. Conjunctivae pink. Mouth: Oropharyngeal mucosa moist and pink , no lesions erythema or exudate. Lungs: Clear to auscultation bilaterally. Non-labored. Heart: Regular rate and rhythm, no murmurs rubs or gallops.  Abdomen: Bowel sounds are normal, nontender, nondistended, no hepatosplenomegaly or masses, no abdominal bruits or hernia , no rebound or guarding.   Extremities: No lower extremity edema. No clubbing or deformities. Neuro: Alert and oriented x 3.  Grossly intact. Skin: Warm and dry, no jaundice.   Psych: Alert and cooperative, normal mood and affect.   Imaging Studies: No results found.  Assessment and Plan:   Daniel Ritter is a 45 y.o. y/o male ***    Dr [name]  MD,MRCP [facility]) Follow up in ***

## 2021-02-05 ENCOUNTER — Telehealth: Payer: Self-pay | Admitting: Gastroenterology

## 2021-02-05 NOTE — Telephone Encounter (Signed)
Patient wants ton know if he can be seen as soon as possible or can he get an prescription sent to the Pharmacy, per patient. Patient stated that he's spitting blood when he stands up and is having dark yellow urine mixed with blood. Clinical staff will follow up with patient.

## 2021-02-06 NOTE — Telephone Encounter (Signed)
Patient called back to the clinic and he was advised to go to the ED since he is spitting and urinating blood. Patient agreed.

## 2021-02-06 NOTE — Telephone Encounter (Signed)
Called patient back and was not able to be connected. However, if he calls back, he will need to go to the ED since he is spitting and urinating blood.

## 2021-07-29 ENCOUNTER — Emergency Department
Admission: EM | Admit: 2021-07-29 | Discharge: 2021-07-29 | Disposition: A | Discharge status: Home / Self Care | Payer: Medicaid Other | Attending: Emergency Medicine | Admitting: Emergency Medicine

## 2021-07-29 ENCOUNTER — Other Ambulatory Visit: Payer: Self-pay

## 2021-07-29 ENCOUNTER — Encounter: Payer: Self-pay | Admitting: Emergency Medicine

## 2021-07-29 DIAGNOSIS — R7989 Other specified abnormal findings of blood chemistry: Secondary | ICD-10-CM | POA: Insufficient documentation

## 2021-07-29 DIAGNOSIS — R000 Tachycardia, unspecified: Secondary | ICD-10-CM | POA: Insufficient documentation

## 2021-07-29 DIAGNOSIS — R739 Hyperglycemia, unspecified: Secondary | ICD-10-CM

## 2021-07-29 DIAGNOSIS — E1165 Type 2 diabetes mellitus with hyperglycemia: Secondary | ICD-10-CM | POA: Insufficient documentation

## 2021-07-29 DIAGNOSIS — S01502A Unspecified open wound of oral cavity, initial encounter: Secondary | ICD-10-CM | POA: Diagnosis present

## 2021-07-29 DIAGNOSIS — X58XXXA Exposure to other specified factors, initial encounter: Secondary | ICD-10-CM | POA: Insufficient documentation

## 2021-07-29 LAB — CBC WITH DIFFERENTIAL/PLATELET
Abs Immature Granulocytes: 0.07 10*3/uL (ref 0.00–0.07)
Basophils Absolute: 0.1 10*3/uL (ref 0.0–0.1)
Basophils Relative: 1 %
Eosinophils Absolute: 0.1 10*3/uL (ref 0.0–0.5)
Eosinophils Relative: 1 %
HCT: 41.7 % (ref 39.0–52.0)
Hemoglobin: 14.9 g/dL (ref 13.0–17.0)
Immature Granulocytes: 1 %
Lymphocytes Relative: 29 %
Lymphs Abs: 2.1 10*3/uL (ref 0.7–4.0)
MCH: 31.8 pg (ref 26.0–34.0)
MCHC: 35.7 g/dL (ref 30.0–36.0)
MCV: 89.1 fL (ref 80.0–100.0)
Monocytes Absolute: 0.5 10*3/uL (ref 0.1–1.0)
Monocytes Relative: 7 %
Neutro Abs: 4.5 10*3/uL (ref 1.7–7.7)
Neutrophils Relative %: 61 %
Platelets: 113 10*3/uL — ABNORMAL LOW (ref 150–400)
RBC: 4.68 MIL/uL (ref 4.22–5.81)
RDW: 12.7 % (ref 11.5–15.5)
Smear Review: NORMAL
WBC: 7.3 10*3/uL (ref 4.0–10.5)
nRBC: 0 % (ref 0.0–0.2)

## 2021-07-29 LAB — COMPREHENSIVE METABOLIC PANEL
ALT: 63 U/L — ABNORMAL HIGH (ref 0–44)
AST: 89 U/L — ABNORMAL HIGH (ref 15–41)
Albumin: 4.4 g/dL (ref 3.5–5.0)
Alkaline Phosphatase: 91 U/L (ref 38–126)
Anion gap: 17 — ABNORMAL HIGH (ref 5–15)
BUN: <5 mg/dL — ABNORMAL LOW (ref 6–20)
CO2: 20 mmol/L — ABNORMAL LOW (ref 22–32)
Calcium: 9.5 mg/dL (ref 8.9–10.3)
Chloride: 98 mmol/L (ref 98–111)
Creatinine, Ser: 0.54 mg/dL — ABNORMAL LOW (ref 0.61–1.24)
GFR, Estimated: >60 mL/min (ref >60)
Glucose, Bld: 399 mg/dL — ABNORMAL HIGH (ref 70–99)
Potassium: 3.6 mmol/L (ref 3.5–5.1)
Sodium: 135 mmol/L (ref 135–145)
Total Bilirubin: 0.9 mg/dL (ref 0.3–1.2)
Total Protein: 8.3 g/dL — ABNORMAL HIGH (ref 6.5–8.1)

## 2021-07-29 LAB — BLOOD GAS, VENOUS
Acid-base deficit: 2.9 mmol/L — ABNORMAL HIGH (ref 0.0–2.0)
Bicarbonate: 21.1 mmol/L (ref 20.0–28.0)
O2 Saturation: 82.6 %
Patient temperature: 37
pCO2, Ven: 34 mmHg — ABNORMAL LOW (ref 44.0–60.0)
pH, Ven: 7.4 (ref 7.250–7.430)
pO2, Ven: 47 mmHg — ABNORMAL HIGH (ref 32.0–45.0)

## 2021-07-29 LAB — BASIC METABOLIC PANEL
Anion gap: 10 (ref 5–15)
BUN: <5 mg/dL — ABNORMAL LOW (ref 6–20)
CO2: 22 mmol/L (ref 22–32)
Calcium: 8.7 mg/dL — ABNORMAL LOW (ref 8.9–10.3)
Chloride: 106 mmol/L (ref 98–111)
Creatinine, Ser: 0.59 mg/dL — ABNORMAL LOW (ref 0.61–1.24)
GFR, Estimated: >60 mL/min (ref >60)
Glucose, Bld: 295 mg/dL — ABNORMAL HIGH (ref 70–99)
Potassium: 3.9 mmol/L (ref 3.5–5.1)
Sodium: 138 mmol/L (ref 135–145)

## 2021-07-29 LAB — BETA-HYDROXYBUTYRIC ACID: Beta-Hydroxybutyric Acid: 0.94 mmol/L — ABNORMAL HIGH (ref 0.05–0.27)

## 2021-07-29 MED ORDER — SODIUM CHLORIDE 0.9 % IV SOLN
1000.0000 mL | Freq: Once | INTRAVENOUS | Status: AC
  Administered 2021-07-29: 1000 mL via INTRAVENOUS

## 2021-07-29 NOTE — ED Provider Notes (Signed)
[facility] Provider Note    Event Date/Time   First MD Initiated Contact with Patient 07/29/21 1458     (approximate)   History   tongue bleeding   HPI  BRYCE CHEEVER is a 46 y.o. male with history of diabetes who presents with complaints of tongue bleeding.  Patient reports about a week ago he bit the tip of his tongue and had extensive bleeding at that time which he described as pulsatile.  He held pressure and eventually the bleeding stopped.  Today he accidentally injured the area again and again had bleeding.  The bleeding has now stopped.  Patient reports compliance with his diabetes medications but reports has been eating ice cream this morning.  Overall he feels well.     Physical Exam   Triage Vital Signs: ED Triage Vitals  Enc Vitals Group     BP 07/29/21 1341 (!) 152/98     Pulse Rate 07/29/21 1341 (!) 117     Resp 07/29/21 1341 16     Temp --      Temp src --      SpO2 07/29/21 1341 97 %     Weight 07/29/21 1132 100.7 kg (222 lb 0.1 oz)     Height 07/29/21 1132 1.829 m (6')     Head Circumference --      Peak Flow --      Pain Score 07/29/21 1132 0     Pain Loc --      Pain Edu? --      Excl. in [place]? --     Most recent vital signs: Vitals:   07/29/21 1341 07/29/21 1801  BP: (!) 152/98 (!) 151/88  Pulse: (!) 117 96  Resp: 16 18  Temp:  98 F (36.7 C)  SpO2: 97% 97%     General: Awake, no distress.  Overall well-appearing CV:  Good peripheral perfusion.  Tachycardia Resp:  Normal effort.  No tachypnea Abd:  No distention.  Other:  Tongue, no active bleeding, tiny area of clot at the tip of his tongue.  Gingivitis, poor dentition   ED Results / Procedures / Treatments   Labs (all labs ordered are listed, but only abnormal results are displayed) Labs Reviewed  CBC WITH DIFFERENTIAL/PLATELET - Abnormal; Notable for the following components:      Result Value   Platelets 113 (*)    All other components within normal  limits  COMPREHENSIVE METABOLIC PANEL - Abnormal; Notable for the following components:   CO2 20 (*)    Glucose, Bld 399 (*)    BUN <5 (*)    Creatinine, Ser 0.54 (*)    Total Protein 8.3 (*)    AST 89 (*)    ALT 63 (*)    Anion gap 17 (*)    All other components within normal limits  BETA-HYDROXYBUTYRIC ACID - Abnormal; Notable for the following components:   Beta-Hydroxybutyric Acid 0.94 (*)    All other components within normal limits  BLOOD GAS, VENOUS - Abnormal; Notable for the following components:   pCO2, Ven 34 (*)    pO2, Ven 47.0 (*)    Acid-base deficit 2.9 (*)    All other components within normal limits  BASIC METABOLIC PANEL - Abnormal; Notable for the following components:   Glucose, Bld 295 (*)    BUN <5 (*)    Creatinine, Ser 0.59 (*)    Calcium 8.7 (*)    All other components within  normal limits     EKG    RADIOLOGY     PROCEDURES:  Critical Care performed:   Procedures   MEDICATIONS ORDERED IN ED: Medications  0.9 %  sodium chloride infusion (0 mLs Intravenous Stopped 07/29/21 1645)  0.9 %  sodium chloride infusion (0 mLs Intravenous Stopped 07/29/21 1740)     IMPRESSION / MDM / ASSESSMENT AND PLAN / ED COURSE  I reviewed the triage vital signs and the nursing notes.  Patient presents complaints of tongue bleeding, seems to have resolved with him holding pressure.  Patient did have labs drawn given his description of significant bleeding.  Is CBC is unremarkable, hemoglobin is normal.  However his CMP demonstrates a glucose of 400, CO2 of 20 and an anion gap of 17 suspicious for DKA.  The patient is tachycardic.  I have added on beta hydroxybutyric acid, VBG, IV normal saline  VBG is reassuring however mildly elevated beta hydroxybutyric acid, will give an additional liter of saline and repeat BMP as the patient tells me he is not willing to stay in the hospital because he feels fine.  I did recommend admission  Repeat BMP  demonstrates normalized anion gap, improved glucose, urged patient to return to the ED if any worsening symptoms, follow-up with ENT for any further bleeding          FINAL CLINICAL IMPRESSION(S) / ED DIAGNOSES   Final diagnoses:  Hyperglycemia  Tongue wound, initial encounter     Rx / DC Orders   ED Discharge Orders     None        Note:  This document was prepared using Dragon voice recognition software and may include unintentional dictation errors.   [name], MD 07/29/21 [phone]

## 2021-07-29 NOTE — ED Triage Notes (Signed)
First Nurse Note:  States bit tongue two weeks ago, tongue started bleeding today.  Denies further injury. Denies taking blood thinners.  AAOx3. NAD

## 2021-07-29 NOTE — ED Notes (Signed)
Patient Alert and oriented to baseline. Stable and ambulatory to baseline. Patient verbalized understanding of the discharge instructions.  Patient belongings were taken by the patient.   

## 2021-09-03 ENCOUNTER — Encounter: Payer: Self-pay | Admitting: Internal Medicine

## 2021-09-04 ENCOUNTER — Encounter
Admission: RE | Disposition: A | Discharge status: Home / Self Care | Payer: Self-pay | Source: Home / Self Care | Attending: Internal Medicine

## 2021-09-04 ENCOUNTER — Ambulatory Visit: Payer: Medicaid Other | Admitting: Certified Registered Nurse Anesthetist

## 2021-09-04 ENCOUNTER — Ambulatory Visit
Admission: RE | Admit: 2021-09-04 | Discharge: 2021-09-04 | Disposition: A | Discharge status: Home / Self Care | Payer: Medicaid Other | Attending: Internal Medicine | Admitting: Internal Medicine

## 2021-09-04 DIAGNOSIS — D696 Thrombocytopenia, unspecified: Secondary | ICD-10-CM | POA: Diagnosis not present

## 2021-09-04 DIAGNOSIS — Z8673 Personal history of transient ischemic attack (TIA), and cerebral infarction without residual deficits: Secondary | ICD-10-CM | POA: Insufficient documentation

## 2021-09-04 DIAGNOSIS — Z7984 Long term (current) use of oral hypoglycemic drugs: Secondary | ICD-10-CM | POA: Diagnosis not present

## 2021-09-04 DIAGNOSIS — D123 Benign neoplasm of transverse colon: Secondary | ICD-10-CM | POA: Insufficient documentation

## 2021-09-04 DIAGNOSIS — R569 Unspecified convulsions: Secondary | ICD-10-CM | POA: Insufficient documentation

## 2021-09-04 DIAGNOSIS — Z87891 Personal history of nicotine dependence: Secondary | ICD-10-CM | POA: Insufficient documentation

## 2021-09-04 DIAGNOSIS — Z79899 Other long term (current) drug therapy: Secondary | ICD-10-CM | POA: Diagnosis not present

## 2021-09-04 DIAGNOSIS — E1140 Type 2 diabetes mellitus with diabetic neuropathy, unspecified: Secondary | ICD-10-CM | POA: Diagnosis not present

## 2021-09-04 DIAGNOSIS — I10 Essential (primary) hypertension: Secondary | ICD-10-CM | POA: Diagnosis not present

## 2021-09-04 DIAGNOSIS — K640 First degree hemorrhoids: Secondary | ICD-10-CM | POA: Diagnosis not present

## 2021-09-04 DIAGNOSIS — K5730 Diverticulosis of large intestine without perforation or abscess without bleeding: Secondary | ICD-10-CM | POA: Insufficient documentation

## 2021-09-04 DIAGNOSIS — K219 Gastro-esophageal reflux disease without esophagitis: Secondary | ICD-10-CM | POA: Diagnosis not present

## 2021-09-04 DIAGNOSIS — F419 Anxiety disorder, unspecified: Secondary | ICD-10-CM | POA: Insufficient documentation

## 2021-09-04 DIAGNOSIS — Z1211 Encounter for screening for malignant neoplasm of colon: Secondary | ICD-10-CM | POA: Diagnosis present

## 2021-09-04 DIAGNOSIS — J449 Chronic obstructive pulmonary disease, unspecified: Secondary | ICD-10-CM | POA: Insufficient documentation

## 2021-09-04 HISTORY — DX: Gastro-esophageal reflux disease without esophagitis: K21.9

## 2021-09-04 HISTORY — DX: Alcohol abuse, in remission: F10.11

## 2021-09-04 HISTORY — DX: Personal history of other diseases of the digestive system: Z87.19

## 2021-09-04 HISTORY — DX: Polyneuropathy, unspecified: G62.9

## 2021-09-04 HISTORY — PX: COLONOSCOPY: SHX5424

## 2021-09-04 LAB — GLUCOSE, CAPILLARY: Glucose-Capillary: 170 mg/dL — ABNORMAL HIGH (ref 70–99)

## 2021-09-04 SURGERY — COLONOSCOPY
Anesthesia: General

## 2021-09-04 MED ORDER — PROPOFOL 10 MG/ML IV BOLUS
INTRAVENOUS | Status: DC | PRN
  Administered 2021-09-04: 80 mg via INTRAVENOUS
  Administered 2021-09-04: 40 mg via INTRAVENOUS
  Administered 2021-09-04: 30 mg via INTRAVENOUS
  Administered 2021-09-04: 40 mg via INTRAVENOUS
  Administered 2021-09-04 (×2): 20 mg via INTRAVENOUS

## 2021-09-04 MED ORDER — PHENYLEPHRINE HCL (PRESSORS) 10 MG/ML IV SOLN
INTRAVENOUS | Status: DC | PRN
  Administered 2021-09-04: 80 ug via INTRAVENOUS
  Administered 2021-09-04: 160 ug via INTRAVENOUS

## 2021-09-04 MED ORDER — SODIUM CHLORIDE 0.9 % IV SOLN
INTRAVENOUS | Status: DC
  Administered 2021-09-04: 20 mL/h via INTRAVENOUS

## 2021-09-04 MED ORDER — PROPOFOL 500 MG/50ML IV EMUL
INTRAVENOUS | Status: AC
  Filled 2021-09-04: qty 50

## 2021-09-04 MED ORDER — PROPOFOL 500 MG/50ML IV EMUL
INTRAVENOUS | Status: DC | PRN
  Administered 2021-09-04: 160 ug/kg/min via INTRAVENOUS

## 2021-09-04 NOTE — Interval H&P Note (Signed)
History and Physical Interval Note: ? ?09/04/2021 ?10:38 AM ? ?Daniel Ritter  has presented today for surgery, with the diagnosis of Colon cancer screening (Z12.11).  The various methods of treatment have been discussed with the patient and family. After consideration of risks, benefits and other options for treatment, the patient has consented to  Procedure(s) with comments: ?COLONOSCOPY (N/A) - DM as a surgical intervention.  The patient's history has been reviewed, patient examined, no change in status, stable for surgery.  I have reviewed the patient's chart and labs.  Questions were answered to the patient's satisfaction.   ? ? ?[place], [place] ? ? ?

## 2021-09-04 NOTE — Anesthesia Postprocedure Evaluation (Signed)
Anesthesia Post Note ? ?Patient: Daniel Ritter ? ?Procedure(s) Performed: COLONOSCOPY ? ?Patient location during evaluation: PACU ?Anesthesia Type: General ?Level of consciousness: awake, oriented and awake and alert ?Pain management: satisfactory to patient ?Vital Signs Assessment: post-procedure vital signs reviewed and stable ?Respiratory status: spontaneous breathing and respiratory function stable ?Cardiovascular status: stable ?Anesthetic complications: no ? ? ?No notable events documented. ? ? ?Last Vitals:  ?Vitals:  ? 09/04/21 1138 09/04/21 1148  ?BP: 130/90 118/85  ?Pulse: 93 94  ?Resp: 15 20  ?Temp:    ?SpO2: 98% 97%  ?  ?Last Pain:  ?Vitals:  ? 09/04/21 1148  ?TempSrc:   ?PainSc: 0-No pain  ? ? ?  ?  ?  ?  ?  ?  ? ?VAN STAVEREN,Jimel Myler ? ? ? ? ?

## 2021-09-04 NOTE — Anesthesia Procedure Notes (Signed)
Date/Time: 09/04/2021 10:45 AM ?Performed by: Demetrius Charity, CRNA ?Pre-anesthesia Checklist: Patient identified, Emergency Drugs available, Suction available, Patient being monitored and Timeout performed ?Patient Re-evaluated:Patient Re-evaluated prior to induction ?Oxygen Delivery Method: Nasal cannula ?Induction Type: IV induction ?Placement Confirmation: CO2 detector and positive ETCO2 ? ? ? ? ?

## 2021-09-04 NOTE — Op Note (Signed)
[facility] ?Gastroenterology ?Patient Name: Daniel Ritter ?Procedure Date: 09/04/2021 10:34 AM ?MRN: [id number] ?Account #: [id number] ?Date of Birth: 09-Dec-1975 ?Admit Type: Outpatient ?Age: 46 ?Room: [facility] ENDO ROOM 2 ?Gender: Male ?Note Status: Finalized ?Instrument Name: Colonoscope [id number] ?Procedure:             Colonoscopy ?Indications:           Screening for colorectal malignant neoplasm ?Providers:             Benay Pike. Yarelis Ambrosino MD, MD ?Medicines:             Propofol per Anesthesia ?Complications:         No immediate complications. ?Procedure:             Pre-Anesthesia Assessment: ?                       - The risks and benefits of the procedure and the  ?                       sedation options and risks were discussed with the  ?                       patient. All questions were answered and informed  ?                       consent was obtained. ?                       - Patient identification and proposed procedure were  ?                       verified prior to the procedure by the nurse. The  ?                       procedure was verified in the procedure room. ?                       - ASA Grade Assessment: III - A patient with severe  ?                       systemic disease. ?                       - After reviewing the risks and benefits, the patient  ?                       was deemed in satisfactory condition to undergo the  ?                       procedure. ?                       After obtaining informed consent, the colonoscope was  ?                       passed under direct vision. Throughout the procedure,  ?                       the patient's blood pressure, pulse, and oxygen  ?  saturations were monitored continuously. The  ?                       Colonoscope was introduced through the anus and  ?                       advanced to the the cecum, identified by appendiceal  ?                       orifice and ileocecal valve. The colonoscopy was  ?                        performed without difficulty. The patient tolerated  ?                       the procedure well. The quality of the bowel  ?                       preparation was adequate. The ileocecal valve,  ?                       appendiceal orifice, and rectum were photographed. ?Findings: ?     The perianal and digital rectal examinations were normal. Pertinent  ?     negatives include normal sphincter tone and no palpable rectal lesions. ?     Non-bleeding internal hemorrhoids were found during retroflexion. The  ?     hemorrhoids were Grade I (internal hemorrhoids that do not prolapse). ?     Many small and large-mouthed diverticula were found in the sigmoid  ?     colon. There was no evidence of diverticular bleeding. ?     A 7 mm polyp was found in the transverse colon. The polyp was sessile.  ?     The polyp was removed with a hot snare. Resection and retrieval were  ?     complete. To prevent bleeding after the polypectomy, one hemostatic clip  ?     was successfully placed (MR conditional). There was no bleeding during,  ?     or at the end, of the procedure. ?     The exam was otherwise without abnormality. ?Impression:            - Non-bleeding internal hemorrhoids. ?                       - Mild diverticulosis in the sigmoid colon. There was  ?                       no evidence of diverticular bleeding. ?                       - One 7 mm polyp in the transverse colon, removed with  ?                       a hot snare. Resected and retrieved. Clip (MR  ?                       conditional) was placed. ?                       - The examination  was otherwise normal. ?Recommendation:        - Patient has a contact number available for  ?                       emergencies. The signs and symptoms of potential  ?                       delayed complications were discussed with the patient.  ?                       Return to normal activities tomorrow. Written  ?                       discharge  instructions were provided to the patient. ?                       - Return to normal activities tomorrow. ?                       - Resume previous diet. ?                       - Continue present medications. ?                       - Repeat colonoscopy is recommended for surveillance.  ?                       The colonoscopy date will be determined after  ?                       pathology results from today's exam become available  ?                       for review. ?                       - Return to GI office PRN. ?                       - The findings and recommendations were discussed with  ?                       the patient. ?Procedure Code(s):     --- Professional --- ?                       [phone], Colonoscopy, flexible; with removal of  ?                       tumor(s), polyp(s), or other lesion(s) by snare  ?                       technique ?Diagnosis Code(s):     --- Professional --- ?                       K57.30, Diverticulosis of large intestine without  ?                       perforation or abscess without bleeding ?  K63.5, Polyp of colon ?                       K64.0, First degree hemorrhoids ?                       Z12.11, Encounter for screening for malignant neoplasm  ?                       of colon ?CPT copyright 2019 [employer]. All rights reserved. ?The codes documented in this report are preliminary and upon coder review may  ?be revised to meet current compliance requirements. ?Efrain Sella MD, MD ?09/04/2021 11:23:15 AM ?This report has been signed electronically. ?Number of Addenda: 0 ?Note Initiated On: 09/04/2021 10:34 AM ?Scope Withdrawal Time: 0 hours 16 minutes 50 seconds  ?Total Procedure Duration: 0 hours 24 minutes 26 seconds  ?Estimated Blood Loss:  Estimated blood loss: none. Estimated blood loss: none. ?     [facility] ?

## 2021-09-04 NOTE — Anesthesia Preprocedure Evaluation (Signed)
Anesthesia Evaluation  ?Patient identified by MRN, date of birth, ID band ?Patient awake ? ? ? ?Reviewed: ?Allergy & Precautions, NPO status , Patient's Chart, lab work & pertinent test results ? ?Airway ?Mallampati: III ? ?TM Distance: >3 FB ?Neck ROM: full ? ? ? Dental ? ?(+) Teeth Intact ?  ?Pulmonary ?neg pulmonary ROS, COPD, former smoker,  ?  ?Pulmonary exam normal ? ?+ decreased breath sounds ? ? ? ? ? Cardiovascular ?Exercise Tolerance: Good ?hypertension, Pt. on medications ?negative cardio ROS ?Normal cardiovascular exam ?Rhythm:Regular  ? ?  ?Neuro/Psych ?Seizures -,  Anxiety CVA negative neurological ROS ? negative psych ROS  ? GI/Hepatic ?negative GI ROS, Neg liver ROS, GERD  ,  ?Endo/Other  ?negative endocrine ROSdiabetes, Well Controlled, Type 2, Oral Hypoglycemic Agents ? Renal/GU ?negative Renal ROS  ?negative genitourinary ?  ?Musculoskeletal ?negative musculoskeletal ROS ?(+)  ? Abdominal ?Normal abdominal exam  (+)   ?Peds ?negative pediatric ROS ?(+)  Hematology ?negative hematology ROS ?(+)   ?Anesthesia Other Findings ?Past Medical History: ?No date: Allergy ?    Comment:  seasonal ?No date: Anxiety ?No date: Diabetes mellitus without complication ([place]) ?No date: GERD (gastroesophageal reflux disease) ?No date: H/O ETOH abuse ?No date: History of rectal abscess ?2007: Hypertension ?No date: Neuropathy ?No date: Pancreatitis ?    Comment:  history of, pt states due to alcohol ?08/2014: Seizures ([place]) ?    Comment:  " two seizures in the hospital" ?08/31/2013: Stroke ([place]) ?No date: Thrombocytopenia ([place]) ? ?Past Surgical History: ?10, 1993: ANTERIOR CRUCIATE LIGAMENT REPAIR; Left ?12/14/2015: INCISION AND DRAINAGE PERIRECTAL ABSCESS; N/A ?    Comment:  Procedure: IRRIGATION AND DEBRIDEMENT PERIRECTAL  ?             ABSCESS;  Surgeon: Christene Lye, MD;  Location:  ?             [facility] ORS;  Service: General;  Laterality: N/A; ? ?BMI   ? Body Mass Index:  31.53 kg/m?  ?  ? ? Reproductive/Obstetrics ?negative OB ROS ? ?  ? ? ? ? ? ? ? ? ? ? ? ? ? ?  ?  ? ? ? ? ? ? ? ? ?Anesthesia Physical ?Anesthesia Plan ? ?ASA: 3 ? ?Anesthesia Plan: General  ? ?Post-op Pain Management:   ? ?Induction: Intravenous ? ?PONV Risk Score and Plan: Propofol infusion and TIVA ? ?Airway Management Planned: Natural Airway and Nasal Cannula ? ?Additional Equipment:  ? ?Intra-op Plan:  ? ?Post-operative Plan:  ? ?Informed Consent: I have reviewed the patients History and Physical, chart, labs and discussed the procedure including the risks, benefits and alternatives for the proposed anesthesia with the patient or authorized representative who has indicated his/her understanding and acceptance.  ? ? ? ?Dental Advisory Given ? ?Plan Discussed with: CRNA and Surgeon ? ?Anesthesia Plan Comments:   ? ? ? ? ? ? ?Anesthesia Quick Evaluation ? ?

## 2021-09-04 NOTE — Transfer of Care (Signed)
Immediate Anesthesia Transfer of Care Note ? ?Patient: Daniel Ritter ? ?Procedure(s) Performed: COLONOSCOPY ? ?Patient Location: PACU ? ?Anesthesia Type:General ? ?Level of Consciousness: awake and alert  ? ?Airway & Oxygen Therapy: Patient Spontanous Breathing ? ?Post-op Assessment: Report given to RN and Post -op Vital signs reviewed and stable ? ?Post vital signs: Reviewed and stable ? ?Last Vitals:  ?Vitals Value Taken Time  ?BP    ?Temp    ?Pulse 92 09/04/21 1118  ?Resp 18 09/04/21 1118  ?SpO2 97 % 09/04/21 1118  ?Vitals shown include unvalidated device data. ? ?Last Pain:  ?Vitals:  ? 09/04/21 0904  ?TempSrc: Temporal  ?   ? ?  ? ?Complications: No notable events documented. ?

## 2021-09-04 NOTE — H&P (Signed)
Outpatient short stay form Pre-procedure ?09/04/2021 10:36 AM ?Daniel Ritter K. Alice Reichert, M.D. ? ?Primary Physician: Theotis Burrow, M.D. ? ?Reason for visit:  Colon cancer screening ? ?History of present illness:  46 y/o male with history of heavy alcohol use and thrombocytopenia, fatty liver and splenomegaly on U/S of abdomen 05/12/2020 presents for colon cancer screening. Patient denies change in bowel habits, rectal bleeding, weight loss or abdominal pain.  ? ? ? ? ?Current Facility-Administered Medications:  ?  0.9 %  sodium chloride infusion, , Intravenous, Continuous, [place], Benay Pike, MD, Last Rate: 20 mL/hr at 09/04/21 0917, 20 mL/hr at 09/04/21 0917 ? ?Medications Prior to Admission  ?Medication Sig Dispense Refill Last Dose  ? ALPRAZolam (XANAX) 0.25 MG tablet Take 0.25 mg by mouth 2 (two) times daily.     ? ALPRAZolam (XANAX) 1 MG tablet Take by mouth at bedtime as needed.  1 09/04/2021 at 0700  ? ferrous sulfate 325 (65 FE) MG tablet Take 325 mg by mouth 2 (two) times daily with a meal.   09/03/2021  ? gabapentin (NEURONTIN) 800 MG tablet Take 400 mg by mouth 3 (three) times daily. 1-2 by mouth 3 times daily   09/03/2021  ? JANUVIA 100 MG tablet Take 100 mg by mouth daily.   09/03/2021  ? levETIRAcetam (KEPPRA) 500 MG tablet Take 1 tablet (500 mg total) by mouth 2 (two) times daily. 30 tablet 2 09/04/2021 at 0700  ? lisinopril-hydrochlorothiazide (PRINZIDE,ZESTORETIC) 20-12.5 MG per tablet Take 1 tablet by mouth daily.   09/04/2021 at 0700  ? metFORMIN (GLUCOPHAGE) 1000 MG tablet Take 1,000 mg by mouth 2 (two) times daily with a meal.   09/03/2021  ? metoprolol succinate (TOPROL-XL) 100 MG 24 hr tablet Take 100 mg by mouth at bedtime. Take with or immediately following a meal.   09/04/2021 at 0700  ? omeprazole (PRILOSEC) 20 MG capsule Take 20 mg by mouth 2 (two) times daily before a meal.   09/03/2021  ? potassium chloride (MICRO-K) 10 MEQ CR capsule Take 10 mEq by mouth daily.   09/03/2021  ? vitamin B-12  (CYANOCOBALAMIN) 250 MCG tablet Take 250 mcg by mouth in the morning and at bedtime.   09/03/2021  ? Vitamin D, Ergocalciferol, (DRISDOL) 1.25 MG (50000 UNIT) CAPS capsule Take 50,000 Units by mouth every 7 (seven) days.   09/03/2021  ? ? ? ?No Known Allergies ? ? ?Past Medical History:  ?Diagnosis Date  ? Allergy   ? seasonal  ? Anxiety   ? Diabetes mellitus without complication ([place])   ? GERD (gastroesophageal reflux disease)   ? H/O ETOH abuse   ? History of rectal abscess   ? Hypertension 2007  ? Neuropathy   ? Pancreatitis   ? history of, pt states due to alcohol  ? Seizures ([place]) 08/2014  ? " two seizures in the hospital"  ? Stroke ([place]) 08/31/2013  ? Thrombocytopenia ([place])   ? ? ?Review of systems:  Otherwise negative.  ? ? ?Physical Exam ? ?Gen: Alert, oriented. Appears stated age.  ?HEENT: [place]/AT. PERRLA. ?Lungs: CTA, no wheezes. ?CV: RR nl S1, S2. ?Abd: soft, benign, no masses. BS+ ?Ext: No edema. Pulses 2+ ? ? ? ?Planned procedures: Proceed with colonoscopy. The patient understands the nature of the planned procedure, indications, risks, alternatives and potential complications including but not limited to bleeding, infection, perforation, damage to internal organs and possible oversedation/side effects from anesthesia. The patient agrees and gives consent to proceed.  ?Please refer to procedure  notes for findings, recommendations and patient disposition/instructions.  ? ? ? ?Daniel Ritter K. Alice Reichert, M.D. ?Gastroenterology ?09/04/2021  10:36 AM ? ? ? ? ? ? ?

## 2021-09-04 NOTE — Progress Notes (Signed)
Up to bathroom with Anell Barr, RN after two vital signs, patient stated he felt the need to have a bowel movement.  Returned to stretcher from bathroom to resume monitoring of vital signs. ?

## 2021-09-05 ENCOUNTER — Encounter: Payer: Self-pay | Admitting: Internal Medicine

## 2021-09-05 LAB — SURGICAL PATHOLOGY

## 2022-01-28 ENCOUNTER — Inpatient Hospital Stay
Admission: EM | Admit: 2022-01-28 | Discharge: 2022-02-05 | DRG: 854 | Disposition: A | Discharge status: Home Health | Payer: Medicaid Other | Attending: Internal Medicine | Admitting: Internal Medicine

## 2022-01-28 ENCOUNTER — Emergency Department: Payer: Medicaid Other

## 2022-01-28 ENCOUNTER — Other Ambulatory Visit: Payer: Self-pay

## 2022-01-28 ENCOUNTER — Inpatient Hospital Stay: Payer: Medicaid Other

## 2022-01-28 DIAGNOSIS — Z9889 Other specified postprocedural states: Secondary | ICD-10-CM | POA: Diagnosis not present

## 2022-01-28 DIAGNOSIS — Z7984 Long term (current) use of oral hypoglycemic drugs: Secondary | ICD-10-CM

## 2022-01-28 DIAGNOSIS — F419 Anxiety disorder, unspecified: Secondary | ICD-10-CM | POA: Diagnosis present

## 2022-01-28 DIAGNOSIS — G40909 Epilepsy, unspecified, not intractable, without status epilepticus: Secondary | ICD-10-CM | POA: Diagnosis present

## 2022-01-28 DIAGNOSIS — L0390 Cellulitis, unspecified: Secondary | ICD-10-CM | POA: Diagnosis not present

## 2022-01-28 DIAGNOSIS — M86171 Other acute osteomyelitis, right ankle and foot: Secondary | ICD-10-CM

## 2022-01-28 DIAGNOSIS — E1151 Type 2 diabetes mellitus with diabetic peripheral angiopathy without gangrene: Secondary | ICD-10-CM | POA: Diagnosis present

## 2022-01-28 DIAGNOSIS — E1169 Type 2 diabetes mellitus with other specified complication: Secondary | ICD-10-CM | POA: Diagnosis present

## 2022-01-28 DIAGNOSIS — L03031 Cellulitis of right toe: Secondary | ICD-10-CM | POA: Diagnosis present

## 2022-01-28 DIAGNOSIS — Z794 Long term (current) use of insulin: Secondary | ICD-10-CM | POA: Diagnosis not present

## 2022-01-28 DIAGNOSIS — E440 Moderate protein-calorie malnutrition: Secondary | ICD-10-CM | POA: Insufficient documentation

## 2022-01-28 DIAGNOSIS — I10 Essential (primary) hypertension: Secondary | ICD-10-CM | POA: Diagnosis present

## 2022-01-28 DIAGNOSIS — E1142 Type 2 diabetes mellitus with diabetic polyneuropathy: Secondary | ICD-10-CM

## 2022-01-28 DIAGNOSIS — K219 Gastro-esophageal reflux disease without esophagitis: Secondary | ICD-10-CM | POA: Diagnosis present

## 2022-01-28 DIAGNOSIS — Z818 Family history of other mental and behavioral disorders: Secondary | ICD-10-CM | POA: Diagnosis not present

## 2022-01-28 DIAGNOSIS — Z6831 Body mass index (BMI) 31.0-31.9, adult: Secondary | ICD-10-CM

## 2022-01-28 DIAGNOSIS — Z8673 Personal history of transient ischemic attack (TIA), and cerebral infarction without residual deficits: Secondary | ICD-10-CM

## 2022-01-28 DIAGNOSIS — Z87891 Personal history of nicotine dependence: Secondary | ICD-10-CM

## 2022-01-28 DIAGNOSIS — R569 Unspecified convulsions: Secondary | ICD-10-CM | POA: Diagnosis not present

## 2022-01-28 DIAGNOSIS — M009 Pyogenic arthritis, unspecified: Secondary | ICD-10-CM | POA: Diagnosis present

## 2022-01-28 DIAGNOSIS — Z8249 Family history of ischemic heart disease and other diseases of the circulatory system: Secondary | ICD-10-CM

## 2022-01-28 DIAGNOSIS — Z89421 Acquired absence of other right toe(s): Secondary | ICD-10-CM | POA: Diagnosis not present

## 2022-01-28 DIAGNOSIS — R7881 Bacteremia: Secondary | ICD-10-CM | POA: Diagnosis not present

## 2022-01-28 DIAGNOSIS — L089 Local infection of the skin and subcutaneous tissue, unspecified: Secondary | ICD-10-CM | POA: Diagnosis present

## 2022-01-28 DIAGNOSIS — E11621 Type 2 diabetes mellitus with foot ulcer: Secondary | ICD-10-CM | POA: Diagnosis present

## 2022-01-28 DIAGNOSIS — F32A Depression, unspecified: Secondary | ICD-10-CM | POA: Diagnosis present

## 2022-01-28 DIAGNOSIS — E11628 Type 2 diabetes mellitus with other skin complications: Secondary | ICD-10-CM | POA: Diagnosis present

## 2022-01-28 DIAGNOSIS — A419 Sepsis, unspecified organism: Principal | ICD-10-CM | POA: Diagnosis present

## 2022-01-28 DIAGNOSIS — L97529 Non-pressure chronic ulcer of other part of left foot with unspecified severity: Secondary | ICD-10-CM | POA: Diagnosis present

## 2022-01-28 DIAGNOSIS — I70235 Atherosclerosis of native arteries of right leg with ulceration of other part of foot: Secondary | ICD-10-CM | POA: Diagnosis not present

## 2022-01-28 DIAGNOSIS — M19071 Primary osteoarthritis, right ankle and foot: Secondary | ICD-10-CM | POA: Diagnosis present

## 2022-01-28 DIAGNOSIS — F17210 Nicotine dependence, cigarettes, uncomplicated: Secondary | ICD-10-CM | POA: Diagnosis not present

## 2022-01-28 DIAGNOSIS — Z79899 Other long term (current) drug therapy: Secondary | ICD-10-CM

## 2022-01-28 DIAGNOSIS — L97519 Non-pressure chronic ulcer of other part of right foot with unspecified severity: Secondary | ICD-10-CM | POA: Diagnosis present

## 2022-01-28 DIAGNOSIS — I700 Atherosclerosis of aorta: Secondary | ICD-10-CM | POA: Diagnosis not present

## 2022-01-28 LAB — CBC WITH DIFFERENTIAL/PLATELET
Abs Immature Granulocytes: 0.26 10*3/uL — ABNORMAL HIGH (ref 0.00–0.07)
Basophils Absolute: 0.2 10*3/uL — ABNORMAL HIGH (ref 0.0–0.1)
Basophils Relative: 1 %
Eosinophils Absolute: 0.1 10*3/uL (ref 0.0–0.5)
Eosinophils Relative: 1 %
HCT: 43.6 % (ref 39.0–52.0)
Hemoglobin: 14.9 g/dL (ref 13.0–17.0)
Immature Granulocytes: 2 %
Lymphocytes Relative: 11 %
Lymphs Abs: 1.8 10*3/uL (ref 0.7–4.0)
MCH: 30.2 pg (ref 26.0–34.0)
MCHC: 34.2 g/dL (ref 30.0–36.0)
MCV: 88.4 fL (ref 80.0–100.0)
Monocytes Absolute: 0.9 10*3/uL (ref 0.1–1.0)
Monocytes Relative: 5 %
Neutro Abs: 12.6 10*3/uL — ABNORMAL HIGH (ref 1.7–7.7)
Neutrophils Relative %: 80 %
Platelets: 311 10*3/uL (ref 150–400)
RBC: 4.93 MIL/uL (ref 4.22–5.81)
RDW: 12.8 % (ref 11.5–15.5)
WBC: 15.7 10*3/uL — ABNORMAL HIGH (ref 4.0–10.5)
nRBC: 0 % (ref 0.0–0.2)

## 2022-01-28 LAB — COMPREHENSIVE METABOLIC PANEL
ALT: 23 U/L (ref 0–44)
AST: 36 U/L (ref 15–41)
Albumin: 3.9 g/dL (ref 3.5–5.0)
Alkaline Phosphatase: 82 U/L (ref 38–126)
Anion gap: 10 (ref 5–15)
BUN: 15 mg/dL (ref 6–20)
CO2: 28 mmol/L (ref 22–32)
Calcium: 9.9 mg/dL (ref 8.9–10.3)
Chloride: 98 mmol/L (ref 98–111)
Creatinine, Ser: 1.06 mg/dL (ref 0.61–1.24)
GFR, Estimated: >60 mL/min (ref >60)
Glucose, Bld: 154 mg/dL — ABNORMAL HIGH (ref 70–99)
Potassium: 3.9 mmol/L (ref 3.5–5.1)
Sodium: 136 mmol/L (ref 135–145)
Total Bilirubin: 0.9 mg/dL (ref 0.3–1.2)
Total Protein: 9.5 g/dL — ABNORMAL HIGH (ref 6.5–8.1)

## 2022-01-28 LAB — LACTIC ACID, PLASMA
Lactic Acid, Venous: 0.9 mmol/L (ref 0.5–1.9)
Lactic Acid, Venous: 1.1 mmol/L (ref 0.5–1.9)

## 2022-01-28 LAB — PROTIME-INR
INR: 1.1 (ref 0.8–1.2)
Prothrombin Time: 14 seconds (ref 11.4–15.2)

## 2022-01-28 MED ORDER — ACETAMINOPHEN 650 MG RE SUPP: 650.0000 mg | Freq: Four times a day (QID) | RECTAL | Status: DC | PRN

## 2022-01-28 MED ORDER — SODIUM CHLORIDE 0.9 % IV SOLN
250.0000 mg | Freq: Once | INTRAVENOUS | Status: DC
  Filled 2022-01-28: qty 2.5

## 2022-01-28 MED ORDER — VANCOMYCIN HCL IN DEXTROSE 1-5 GM/200ML-% IV SOLN: 1000.0000 mg | Freq: Once | INTRAVENOUS | Status: DC

## 2022-01-28 MED ORDER — LISINOPRIL 20 MG PO TABS
20.0000 mg | ORAL_TABLET | Freq: Every day | ORAL | Status: DC
  Administered 2022-01-29 – 2022-02-05 (×7): 20 mg via ORAL
  Filled 2022-01-28 (×8): qty 1

## 2022-01-28 MED ORDER — LEVETIRACETAM 500 MG PO TABS
500.0000 mg | ORAL_TABLET | Freq: Two times a day (BID) | ORAL | Status: DC
  Administered 2022-01-28 – 2022-02-05 (×16): 500 mg via ORAL
  Filled 2022-01-28 (×17): qty 1

## 2022-01-28 MED ORDER — VITAMIN D (ERGOCALCIFEROL) 1.25 MG (50000 UNIT) PO CAPS: 50000.0000 [IU] | ORAL_CAPSULE | ORAL | Status: DC

## 2022-01-28 MED ORDER — MAGNESIUM HYDROXIDE 400 MG/5ML PO SUSP: 30.0000 mL | Freq: Every day | ORAL | Status: DC | PRN

## 2022-01-28 MED ORDER — TRAZODONE HCL 50 MG PO TABS: 25.0000 mg | ORAL_TABLET | Freq: Every evening | ORAL | Status: DC | PRN

## 2022-01-28 MED ORDER — VANCOMYCIN HCL 500 MG/100ML IV SOLN
500.0000 mg | Freq: Once | INTRAVENOUS | Status: AC
  Administered 2022-01-28: 500 mg via INTRAVENOUS
  Filled 2022-01-28: qty 100

## 2022-01-28 MED ORDER — ACETAMINOPHEN 325 MG PO TABS
650.0000 mg | ORAL_TABLET | Freq: Four times a day (QID) | ORAL | Status: DC | PRN
  Administered 2022-01-29: 650 mg via ORAL
  Filled 2022-01-28: qty 2

## 2022-01-28 MED ORDER — PANTOPRAZOLE SODIUM 40 MG PO TBEC
40.0000 mg | DELAYED_RELEASE_TABLET | Freq: Every day | ORAL | Status: DC
  Administered 2022-01-29 – 2022-02-05 (×8): 40 mg via ORAL
  Filled 2022-01-28 (×8): qty 1

## 2022-01-28 MED ORDER — VANCOMYCIN HCL 1250 MG/250ML IV SOLN
1250.0000 mg | Freq: Two times a day (BID) | INTRAVENOUS | Status: DC
  Administered 2022-01-29 – 2022-01-31 (×5): 1250 mg via INTRAVENOUS
  Filled 2022-01-28 (×6): qty 250

## 2022-01-28 MED ORDER — HYDROCHLOROTHIAZIDE 12.5 MG PO TABS
12.5000 mg | ORAL_TABLET | Freq: Every day | ORAL | Status: DC
  Administered 2022-01-29 – 2022-02-05 (×7): 12.5 mg via ORAL
  Filled 2022-01-28 (×8): qty 1

## 2022-01-28 MED ORDER — ENOXAPARIN SODIUM 60 MG/0.6ML IJ SOSY
55.0000 mg | PREFILLED_SYRINGE | INTRAMUSCULAR | Status: DC
  Administered 2022-01-28 – 2022-02-02 (×6): 55 mg via SUBCUTANEOUS
  Filled 2022-01-28 (×6): qty 0.6

## 2022-01-28 MED ORDER — VITAMIN B-12 250 MCG PO TABS: 250.0000 ug | ORAL_TABLET | Freq: Every day | ORAL | Status: DC

## 2022-01-28 MED ORDER — FERROUS SULFATE 325 (65 FE) MG PO TABS: 325.0000 mg | ORAL_TABLET | Freq: Two times a day (BID) | ORAL | Status: DC

## 2022-01-28 MED ORDER — GADOBUTROL 1 MMOL/ML IV SOLN
10.0000 mL | Freq: Once | INTRAVENOUS | Status: AC | PRN
  Administered 2022-01-29: 10 mL via INTRAVENOUS

## 2022-01-28 MED ORDER — PIPERACILLIN-TAZOBACTAM 3.375 G IVPB 30 MIN: 3.3750 g | Freq: Four times a day (QID) | INTRAVENOUS | Status: DC

## 2022-01-28 MED ORDER — ONDANSETRON HCL 4 MG PO TABS: 4.0000 mg | ORAL_TABLET | Freq: Four times a day (QID) | ORAL | Status: DC | PRN

## 2022-01-28 MED ORDER — LISINOPRIL-HYDROCHLOROTHIAZIDE 20-12.5 MG PO TABS
1.0000 | ORAL_TABLET | Freq: Every day | ORAL | Status: DC
Start: 2022-01-28 — End: 2022-01-28

## 2022-01-28 MED ORDER — LINAGLIPTIN 5 MG PO TABS
5.0000 mg | ORAL_TABLET | Freq: Every day | ORAL | Status: DC
  Administered 2022-01-29 – 2022-02-05 (×7): 5 mg via ORAL
  Filled 2022-01-28 (×8): qty 1

## 2022-01-28 MED ORDER — PIPERACILLIN-TAZOBACTAM 3.375 G IVPB 30 MIN
3.3750 g | Freq: Once | INTRAVENOUS | Status: DC
  Filled 2022-01-28: qty 50

## 2022-01-28 MED ORDER — AMLODIPINE BESYLATE 10 MG PO TABS
10.0000 mg | ORAL_TABLET | Freq: Every day | ORAL | Status: DC
  Administered 2022-01-29 – 2022-02-05 (×7): 10 mg via ORAL
  Filled 2022-01-28 (×2): qty 1
  Filled 2022-01-28 (×2): qty 2
  Filled 2022-01-28 (×4): qty 1

## 2022-01-28 MED ORDER — LACTATED RINGERS IV BOLUS
1000.0000 mL | Freq: Once | INTRAVENOUS | Status: AC
  Administered 2022-01-28: 1000 mL via INTRAVENOUS

## 2022-01-28 MED ORDER — VANCOMYCIN HCL 2000 MG/400ML IV SOLN
2000.0000 mg | Freq: Once | INTRAVENOUS | Status: AC
  Administered 2022-01-28: 2000 mg via INTRAVENOUS
  Filled 2022-01-28: qty 400

## 2022-01-28 MED ORDER — VANCOMYCIN HCL 2000 MG/400ML IV SOLN
2000.0000 mg | Freq: Once | INTRAVENOUS | Status: DC
  Filled 2022-01-28: qty 400

## 2022-01-28 MED ORDER — POTASSIUM CHLORIDE CRYS ER 20 MEQ PO TBCR: 10.0000 meq | EXTENDED_RELEASE_TABLET | Freq: Every day | ORAL | Status: DC

## 2022-01-28 MED ORDER — METOPROLOL SUCCINATE ER 50 MG PO TB24
100.0000 mg | ORAL_TABLET | Freq: Every day | ORAL | Status: DC
  Administered 2022-01-29 – 2022-02-05 (×7): 100 mg via ORAL
  Filled 2022-01-28 (×8): qty 2

## 2022-01-28 MED ORDER — ONDANSETRON HCL 4 MG/2ML IJ SOLN: 4.0000 mg | Freq: Four times a day (QID) | INTRAMUSCULAR | Status: DC | PRN

## 2022-01-28 MED ORDER — GABAPENTIN 800 MG PO TABS: 400.0000 mg | ORAL_TABLET | Freq: Three times a day (TID) | ORAL | Status: DC

## 2022-01-28 MED ORDER — PIPERACILLIN-TAZOBACTAM 3.375 G IVPB
3.3750 g | Freq: Three times a day (TID) | INTRAVENOUS | Status: DC
  Administered 2022-01-28 – 2022-02-05 (×25): 3.375 g via INTRAVENOUS
  Filled 2022-01-28 (×24): qty 50

## 2022-01-28 MED ORDER — DULOXETINE HCL 30 MG PO CPEP
30.0000 mg | ORAL_CAPSULE | Freq: Every day | ORAL | Status: DC
  Administered 2022-01-29 – 2022-01-31 (×3): 30 mg via ORAL
  Filled 2022-01-28 (×3): qty 1

## 2022-01-28 NOTE — H&P (Addendum)
[place]   PATIENT NAME: Daniel Ritter    MR#:  [id number]  DATE OF BIRTH:  May 27, 1976  DATE OF ADMISSION:  01/28/2022  PRIMARY CARE PHYSICIAN: Theotis Burrow, MD   Patient is coming from: Home  REQUESTING/REFERRING PHYSICIAN: Rada Hay, MD  CHIEF COMPLAINT:   Chief Complaint  Patient presents with   Wound Check    HISTORY OF PRESENT ILLNESS:  Daniel Ritter is a 46 y.o. Caucasian male with medical history significant for anxiety, type diabetes mellitus, GERD, hypertension, seizure disorder, peripheral neuropathy and pancreatitis, presented to the emergency room with acute onset of worsening right foot swelling with erythema as well as deteriorating ulcer on the right fifth MTP which has been there for a couple of months.  Patient thought it was initially healing and later on noticed worsening redness and purulent discharge since Wednesday.  He also noticed a black bump per his report.  Since then erythematous been extending to his left foot dorsum.  He has a scab in his left first web space and plantar ulcer on the left foot which has not significantly worsened.  He denies any chest pain or dyspnea or cough.  No nausea or vomiting or abdominal pain.  No fever or chills.  No dysuria, oliguria or hematuria or flank pain.  ED Course: Upon presentation to the emergency room BP was 144/99 with heart rate of 105 with otherwise normal vital signs.  Labs revealed unremarkable CMP and troponin of 9.5.  Lactic acid 0.9 and later 1.1 and CBC showed leukocytosis 15.7 with neutrophilia.  PT and INR within normal.  Blood cultures were drawn.    Imaging: 2 view chest x-ray showed no acute cardiopulmonary disease.  Right foot x-ray showed the following: There are pockets of air in the soft tissues adjacent to the right fifth metatarsophalangeal joint suggesting possible infectious process. Possible small lucencies are seen in the adjacent bony structures. Possibility of  osteomyelitis in the head of fifth metatarsal and base of proximal phalanx of right fifth toe is not excluded. If clinically warranted, follow-up MRI or three-phase bone scan may be considered.  The patient was given IV vancomycin and Zosyn.  He will be admitted to a medical telemetry bed for further evaluation and management. PAST MEDICAL HISTORY:   Past Medical History:  Diagnosis Date   Allergy    seasonal   Anxiety    Diabetes mellitus without complication ([place])    GERD (gastroesophageal reflux disease)    H/O ETOH abuse    History of rectal abscess    Hypertension 2007   Neuropathy    Pancreatitis    history of, pt states due to alcohol   Seizures ([place]) 08/2014   " two seizures in the hospital"   Stroke [facility]) 08/31/2013   Thrombocytopenia ([place])     PAST SURGICAL HISTORY:   Past Surgical History:  Procedure Laterality Date   [place], 1993   COLONOSCOPY N/A 09/04/2021   Procedure: COLONOSCOPY;  Surgeon: Toledo, Benay Pike, MD;  Location: [facility] ENDOSCOPY;  Service: Gastroenterology;  Laterality: N/A;  DM   INCISION AND DRAINAGE PERIRECTAL ABSCESS N/A 12/14/2015   Procedure: IRRIGATION AND DEBRIDEMENT PERIRECTAL ABSCESS;  Surgeon: Christene Lye, MD;  Location: [facility] ORS;  Service: General;  Laterality: N/A;    SOCIAL HISTORY:   Social History   Tobacco Use   Smoking status: Former    Packs/day: 0.25    Years: 18.00  Total pack years: 4.50    Types: Cigarettes    Quit date: 04/11/2020    Years since quitting: 1.8   Smokeless tobacco: Former    Types: Snuff    Quit date: 2001   Tobacco comments:    patient quit for 3 years and then started back . Currently quit 3 weeks ago   Substance Use Topics   Alcohol use: Yes    Alcohol/week: 0.0 standard drinks of alcohol    Comment: quit drinking about 1 week ago    FAMILY HISTORY:   Family History  Problem Relation Age of Onset   Hypertension Mother    Anxiety disorder  Mother    Hypertension Father    Hypertension Brother    Leukemia Maternal Grandfather    Lung cancer Paternal Grandfather     DRUG ALLERGIES:  No Known Allergies  REVIEW OF SYSTEMS:   ROS As per history of present illness. All pertinent systems were reviewed above. Constitutional, HEENT, cardiovascular, respiratory, GI, GU, musculoskeletal, neuro, psychiatric, endocrine, integumentary and hematologic systems were reviewed and are otherwise negative/unremarkable except for positive findings mentioned above in the HPI.   MEDICATIONS AT HOME:   Prior to Admission medications   Medication Sig Start Date End Date Taking? Authorizing Provider  ALPRAZolam (XANAX) 0.25 MG tablet Take 0.25 mg by mouth 2 (two) times daily.    [provider]  ALPRAZolam Duanne Moron) 1 MG tablet Take by mouth at bedtime as needed. 10/15/14   [provider]  ferrous sulfate 325 (65 FE) MG tablet Take 325 mg by mouth 2 (two) times daily with a meal.    [provider]  gabapentin (NEURONTIN) 800 MG tablet Take 400 mg by mouth 3 (three) times daily. 1-2 by mouth 3 times daily    [provider]  JANUVIA 100 MG tablet Take 100 mg by mouth daily. 04/30/20   [provider]  levETIRAcetam (KEPPRA) 500 MG tablet Take 1 tablet (500 mg total) by mouth 2 (two) times daily. 09/04/14   Donzetta Starch, NP  lisinopril-hydrochlorothiazide (PRINZIDE,ZESTORETIC) 20-12.5 MG per tablet Take 1 tablet by mouth daily.    [provider]  metFORMIN (GLUCOPHAGE) 1000 MG tablet Take 1,000 mg by mouth 2 (two) times daily with a meal.    [provider]  metoprolol succinate (TOPROL-XL) 100 MG 24 hr tablet Take 100 mg by mouth at bedtime. Take with or immediately following a meal.    [provider]  omeprazole (PRILOSEC) 20 MG capsule Take 20 mg by mouth 2 (two) times daily before a meal.    [provider]  potassium chloride (MICRO-K) 10 MEQ CR capsule Take 10  mEq by mouth daily.    [provider]  vitamin B-12 (CYANOCOBALAMIN) 250 MCG tablet Take 250 mcg by mouth in the morning and at bedtime.    [provider]  Vitamin D, Ergocalciferol, (DRISDOL) 1.25 MG (50000 UNIT) CAPS capsule Take 50,000 Units by mouth every 7 (seven) days.    [provider]      VITAL SIGNS:  Blood pressure 137/87, pulse (!) 103, temperature 98.3 F (36.8 C), resp. rate 15, height '6\' 1"'$  (1.854 m), weight 108.4 kg, SpO2 100 %.  PHYSICAL EXAMINATION:  Physical Exam  GENERAL:  46 y.o.-year-old Caucasian male patient lying in the bed with no acute distress.  EYES: Pupils equal, round, reactive to light and accommodation. No scleral icterus. Extraocular muscles intact.  HEENT: Head atraumatic, normocephalic. Oropharynx and nasopharynx clear.  NECK:  Supple, no jugular venous distention. No thyroid enlargement, no tenderness.  LUNGS: Normal breath sounds bilaterally, no wheezing, rales,rhonchi or crepitation. No use of accessory muscles of respiration.  CARDIOVASCULAR: Regular rate and rhythm, S1, S2 normal. No murmurs, rubs, or gallops.  ABDOMEN: Soft, nondistended, nontender. Bowel sounds present. No organomegaly or mass.  EXTREMITIES: No pedal edema, cyanosis, or clubbing.  NEUROLOGIC: Cranial nerves II through XII are intact. Muscle strength 5/5 in all extremities. Sensation intact. Gait not checked.  PSYCHIATRIC: The patient is alert and oriented x 3.  Normal affect and good eye contact. SKIN: Right fifth MTP joint ulcer with whitish eschar in the base and surrounding yellowish dried eschar and extending erythema to the lateral side of the foot as well as the fifth digit with induration and tenderness and warmth.  He has another right foot plantar ulcer close to the head of the fifth metatarsal bone.  Left foot first webspace wound covered with scab and plantar wound at the head of the fifth metatarsal bone.           LABORATORY PANEL:    CBC Recent Labs  Lab 01/28/22 1251  WBC 15.7*  HGB 14.9  HCT 43.6  PLT 311   ------------------------------------------------------------------------------------------------------------------  Chemistries  Recent Labs  Lab 01/28/22 1251  NA 136  K 3.9  CL 98  CO2 28  GLUCOSE 154*  BUN 15  CREATININE 1.06  CALCIUM 9.9  AST 36  ALT 23  ALKPHOS 82  BILITOT 0.9   ------------------------------------------------------------------------------------------------------------------  Cardiac Enzymes No results for input(s): "TROPONINI" in the last 168 hours. ------------------------------------------------------------------------------------------------------------------  RADIOLOGY:  DG Foot Complete Right  Result Date: 01/28/2022 CLINICAL DATA:  Pain and swelling, open wound in the skin EXAM: RIGHT FOOT COMPLETE - 3+ VIEW COMPARISON:  None Available. FINDINGS: There are multiple pockets of air in the soft tissues adjacent to the fifth metatarsophalangeal joint. In the AP view, faint lucencies are noted in the head of the fifth metatarsal which could not be confirmed in the oblique projection. No recent displaced fracture or dislocation is seen. Extensive arterial calcifications are seen in soft tissues. There is soft tissue swelling over the dorsum of the foot. IMPRESSION: There are pockets of air in the soft tissues adjacent to the right fifth metatarsophalangeal joint suggesting possible infectious process. Possible small lucencies are seen in the adjacent bony structures. Possibility of osteomyelitis in the head of fifth metatarsal and base of proximal phalanx of right fifth toe is not excluded. If clinically warranted, follow-up MRI or three-phase bone scan may be considered. Electronically Signed   By: Elmer Picker M.D.   On: 01/28/2022 13:30   DG Chest 2 View  Result Date: 01/28/2022 CLINICAL DATA:  Possible sepsis EXAM: CHEST - 2 VIEW COMPARISON:  09/17/2011 FINDINGS:  The heart size and mediastinal contours are within normal limits. Both lungs are clear. The visualized skeletal structures are unremarkable. IMPRESSION: No active cardiopulmonary disease. Electronically Signed   By: Elmer Picker M.D.   On: 01/28/2022 13:26      IMPRESSION AND PLAN:  Assessment and Plan: * Sepsis due to cellulitis [facility]) - The patient will be admitted to a medical telemetry bed. - Sepsis is manifested by tachycardia and leukocytosis. - We will continue antibiotic therapy with IV vancomycin and Zosyn given associated osteomyelitis. - We will follow wound and blood cultures. - The patient will be hydrated with IV normal saline.   Acute osteomyelitis of ankle or foot, right ([place]) - This  is involving the head of the fifth metatarsal bone and base of the proximal phalanx of the right fifth toe. - The patient will be placed on IV vancomycin and Zosyn as mentioned above. - We will obtain right foot MRI with and without contrast to confirm osteomyelitis. - Podiatry consult to be obtained.  I notified Dr. Vickki Muff about patient. - We will keep the patient n.p.o. after midnight.  Anxiety and depression - We will continue Xanax and Cymbalta  Type 2 diabetes mellitus with peripheral neuropathy ([place]) - The patient will be placed on supplement coverage with NovoLog. - We will continue Neurontin. - We will continue Januvia and hold off metformin.  Seizures ([place]) - We will continue Keppra.  Essential hypertension - We will continue his antihypertensives     DVT prophylaxis: Lovenox.  Advanced Care Planning:  Code Status: full code.  Family Communication:  The plan of care was discussed in details with the patient (and family). I answered all questions. The patient agreed to proceed with the above mentioned plan. Further management will depend upon hospital course. Disposition Plan: Back to previous home environment Consults called: Podiatry. All the records are reviewed  and case discussed with ED provider.  Status is: Inpatient    At the time of the admission, it appears that the appropriate admission status for this patient is inpatient.  This is judged to be reasonable and necessary in order to provide the required intensity of service to ensure the patient's safety given the presenting symptoms, physical exam findings and initial radiographic and laboratory data in the context of comorbid conditions.  The patient requires inpatient status due to high intensity of service, high risk of further deterioration and high frequency of surveillance required.  I certify that at the time of admission, it is my clinical judgment that the patient will require inpatient hospital care extending more than 2 midnights.                            Dispo: The patient is from: Home              Anticipated d/c is to: Home              Patient currently is not medically stable to d/c.              Difficult to place patient: No  Christel Mormon M.D on 01/28/2022 at 8:54 PM  [facility]   From 7 PM-7 AM, contact night-coverage [email or web address]  CC: Primary care physician; Revelo, Elyse Jarvis, MD

## 2022-01-28 NOTE — Assessment & Plan Note (Signed)
-   We will continue his antihypertensives. 

## 2022-01-28 NOTE — ED Provider Triage Note (Signed)
Emergency Medicine Provider Triage Evaluation Note  Daniel Ritter , a 46 y.o. male  was evaluated in triage.  Pt complains of infection to both feet. Wound to right 4th and 5th toes and dorsal aspect of foot. Also has new wounds to toes on the left foot. Sent to the ER for wound check with suspicion of osteomyelitis.   Physical Exam  BP 105/73 (BP Location: Right Arm)   Pulse (!) 112   Temp 98.1 F (36.7 C) (Oral)   Resp 16   Ht '6\' 1"'$  (1.854 m)   Wt 108.4 kg   SpO2 98%   BMI 31.53 kg/m  Gen:   Awake, no distress   Resp:  Normal effort  MSK:   Moves extremities without difficulty  Other:    Medical Decision Making  Medically screening exam initiated at 12:43 PM.  Appropriate orders placed.  [name] was informed that the remainder of the evaluation will be completed by another provider, this initial triage assessment does not replace that evaluation, and the importance of remaining in the ED until their evaluation is complete.    Victorino Dike, FNP 01/31/22 1003

## 2022-01-28 NOTE — Assessment & Plan Note (Signed)
-   The patient will be placed on supplement coverage with NovoLog. - We will continue Neurontin. - We will continue Januvia and hold off metformin.

## 2022-01-28 NOTE — ED Triage Notes (Signed)
Pt here with a right foot wound from his primary's office. Pt here for IV abx. Wounds to both feet, right foot has odor. Pt states his primary discussed possible amputation.

## 2022-01-28 NOTE — Assessment & Plan Note (Signed)
-   We will continue Xanax and Cymbalta

## 2022-01-28 NOTE — Assessment & Plan Note (Signed)
-   We will continue Keppra. 

## 2022-01-28 NOTE — Assessment & Plan Note (Addendum)
-   The patient will be admitted to a medical telemetry bed. - Sepsis is manifested by tachycardia and leukocytosis. - We will continue antibiotic therapy with IV vancomycin and Zosyn given associated osteomyelitis. - We will follow wound and blood cultures. - The patient will be hydrated with IV normal saline.

## 2022-01-28 NOTE — ED Provider Notes (Signed)
[facility] Provider Note    Event Date/Time   First MD Initiated Contact with Patient 01/28/22 1454     (approximate)   History   Wound Check   HPI  Daniel Ritter is a 46 y.o. male  with pmh DM, CVA, HTN, neuropathy presents with foot wound.  Patient states that he has had a wound on the right fifth toe for several months however it was quite small and looked dark and seem to be improving.  Starting on Thursday he noticed the wound was larger and not the right fifth toe was red and there was oozing and pus.  He denies any injury says he has chronic neuropathy and chronic pain in the feet.  He also has a chronic wound on the plantar surface of the left foot which has actually been improving denies fevers or systemic symptoms.     Past Medical History:  Diagnosis Date   Allergy    seasonal   Anxiety    Diabetes mellitus without complication ([place])    GERD (gastroesophageal reflux disease)    H/O ETOH abuse    History of rectal abscess    Hypertension 2007   Neuropathy    Pancreatitis    history of, pt states due to alcohol   Seizures ([place]) 08/2014   " two seizures in the hospital"   Stroke [facility]) 08/31/2013   Thrombocytopenia ([place])     Patient Active Problem List   Diagnosis Date Noted   Thrombocytopenia ([place]) 05/02/2020   History of alcohol use 05/02/2020   Transaminitis 05/02/2020   Essential hypertension 11/08/2014   Type 2 diabetes mellitus without complication ([place]) 90/24/0973   Anxiety 11/08/2014   Seizures ([place]) 11/08/2014   Uncontrolled hypertension 09/02/2014   Type 2 diabetes mellitus not at goal [facility]) 09/02/2014   Noncompliance with medications 09/02/2014   Convulsions/seizures ([place]) 09/02/2014   Alcohol abuse 09/02/2014   Hemorrhage    Cytotoxic brain edema ([facility])    ICH (intracerebral hemorrhage) ([place]) 08/31/2014     Physical Exam  Triage Vital Signs: ED Triage Vitals  Enc Vitals Group     BP 01/28/22 1240 105/73      Pulse Rate 01/28/22 1240 (!) 112     Resp 01/28/22 1240 16     Temp 01/28/22 1242 98.1 F (36.7 C)     Temp Source 01/28/22 1242 Oral     SpO2 01/28/22 1240 98 %     Weight 01/28/22 1242 238 lb 15.7 oz (108.4 kg)     Height 01/28/22 1242 '6\' 1"'$  (1.854 m)     Head Circumference --      Peak Flow --      Pain Score 01/28/22 1242 6     Pain Loc --      Pain Edu? --      Excl. in [place]? --     Most recent vital signs: Vitals:   01/28/22 1240 01/28/22 1242  BP: 105/73   Pulse: (!) 112   Resp: 16   Temp:  98.1 F (36.7 C)  SpO2: 98%      General: Awake, no distress.  CV:  Good peripheral perfusion.  2+ DP pulses bilaterally Resp:  Normal effort.  Abd:  No distention.  Neuro:             Awake, Alert, Oriented x 3  Other:  Right and left feet pictured below, right fifth toe is erythematous and there is a wound with purulence  and foul odor on the dorsal surface as well as a ulceration on the plantar surface          ED Results / Procedures / Treatments  Labs (all labs ordered are listed, but only abnormal results are displayed) Labs Reviewed  COMPREHENSIVE METABOLIC PANEL - Abnormal; Notable for the following components:      Result Value   Glucose, Bld 154 (*)    Total Protein 9.5 (*)    All other components within normal limits  CBC WITH DIFFERENTIAL/PLATELET - Abnormal; Notable for the following components:   WBC 15.7 (*)    Neutro Abs 12.6 (*)    Basophils Absolute 0.2 (*)    Abs Immature Granulocytes 0.26 (*)    All other components within normal limits  CULTURE, BLOOD (ROUTINE X 2)  CULTURE, BLOOD (ROUTINE X 2)  LACTIC ACID, PLASMA  PROTIME-INR  LACTIC ACID, PLASMA  URINALYSIS, ROUTINE W REFLEX MICROSCOPIC     EKG     RADIOLOGY I have reviewed and interpreted the x-ray of the right foot which shows some soft tissue gas and    PROCEDURES:  Critical Care performed: No  Procedures  The patient is on the cardiac monitor to evaluate for evidence  of arrhythmia and/or significant heart rate changes.   MEDICATIONS ORDERED IN ED: Medications  lactated ringers bolus 1,000 mL (has no administration in time range)  vancomycin (VANCOREADY) IVPB 2000 mg/400 mL (has no administration in time range)  piperacillin-tazobactam (ZOSYN) IVPB 3.375 g (has no administration in time range)     IMPRESSION / MDM / ASSESSMENT AND PLAN / ED COURSE  I reviewed the triage vital signs and the nursing notes.                              Patient's presentation is most consistent with acute presentation with potential threat to life or bodily function.  Differential diagnosis includes, but is not limited to, cellulitis, abscess, osteomyelitis, infected diabetic ulcer  Patient is a 46 year old male with diabetes underlying peripheral neuropathy presents with diabetic foot wound.  Patient does not feel his feet very well.  Has had a small ulcerating wound on the right fifth toe that seem to be healing for several months and then since Thursday, 4 days ago has developed purulence erythema and drainage.  No systemic symptoms.  He is tachycardic and has a leukocytosis meeting SIRS criteria.  On exam the wound on the dorsal surface of the right fifth toe appears acutely infected and purulent.  There is ulceration on the plantar surface.  I suspect underlying osteomyelitis.  X-ray is also concerning for osteo of the head of the fifth metatarsal or base of the proximal phalanx on the fifth digit.  Will cover with vancomycin and Zosyn.  Patient also has ulceration on the dorsal surface of the left foot around the third metatarsal head, there is some erythema of the third toe as well.  May need to have this evaluated as well but I am less concern for acute infection.  Prior admission.  I discussed with the hospitalist.       FINAL CLINICAL IMPRESSION(S) / ED DIAGNOSES   Final diagnoses:  Foot infection     Rx / DC Orders   ED Discharge Orders     None         Note:  This document was prepared using Dragon voice recognition software and may include unintentional dictation  errors.   Rada Hay, MD 01/28/22 [phone]

## 2022-01-28 NOTE — Progress Notes (Addendum)
Pharmacy Antibiotic Note  Daniel Ritter is a 46 y.o. male admitted on 01/28/2022 with cellulitis, worsening chronic foot wound on right fifth toe that is now oozing. There is concern for osteomyelitis. Past medical history includes DM, CVA, HTN, and neuropathy. Pharmacy has been consulted for vancomycin dosing.  Renal function is stable.   Plan: Vancomycin 2,000 mg x 1 ordered. Give additional vancomycin 500 mg to complete a full loading dose of 2,500 mg.  Vancomycin 1250 mg every 12 hours  Goal AUC 400-600 Estimated AUC 541.3, estimated Cmin 14.7 mcg/mL  Also on Zosyn (piperacillin/tazobactam) 3.375 grams every 8 hours (4 hour infusion)  Monitor culture results, renal function, and clinical course.  Height: '6\' 1"'$  (185.4 cm) Weight: 108.4 kg (238 lb 15.7 oz) IBW/kg (Calculated) : 79.9  Temp (24hrs), Avg:98.1 F (36.7 C), Min:98.1 F (36.7 C), Max:98.1 F (36.7 C)  Recent Labs  Lab 01/28/22 1251  WBC 15.7*  CREATININE 1.06  LATICACIDVEN 0.9    Estimated Creatinine Clearance: 111.3 mL/min (by C-G formula based on SCr of 1.06 mg/dL).    No Known Allergies  Antimicrobials this admission: Vancomycin 7/25 >>  Zosyn 7/25 >>   Dose adjustments this admission: N/a  Microbiology results: 7/25 BCx: in process  Thank you for allowing pharmacy to be a part of this patient's care.  Wynelle Cleveland 01/28/2022 5:05 PM

## 2022-01-28 NOTE — Assessment & Plan Note (Addendum)
-   This is involving the head of the fifth metatarsal bone and base of the proximal phalanx of the right fifth toe. - The patient will be placed on IV vancomycin and Zosyn as mentioned above. - We will obtain right foot MRI with and without contrast to confirm osteomyelitis. - Podiatry consult to be obtained.  I notified Dr. Vickki Muff about patient. - We will keep the patient n.p.o. after midnight.

## 2022-01-29 ENCOUNTER — Inpatient Hospital Stay: Payer: Medicaid Other

## 2022-01-29 DIAGNOSIS — L0390 Cellulitis, unspecified: Secondary | ICD-10-CM | POA: Diagnosis not present

## 2022-01-29 DIAGNOSIS — I739 Peripheral vascular disease, unspecified: Secondary | ICD-10-CM

## 2022-01-29 DIAGNOSIS — A419 Sepsis, unspecified organism: Secondary | ICD-10-CM | POA: Diagnosis not present

## 2022-01-29 LAB — HEMOGLOBIN A1C
Hgb A1c MFr Bld: 5.7 % — ABNORMAL HIGH (ref 4.8–5.6)
Mean Plasma Glucose: 116.89 mg/dL

## 2022-01-29 LAB — CBC
HCT: 39.8 % (ref 39.0–52.0)
Hemoglobin: 13.4 g/dL (ref 13.0–17.0)
MCH: 30 pg (ref 26.0–34.0)
MCHC: 33.7 g/dL (ref 30.0–36.0)
MCV: 89 fL (ref 80.0–100.0)
Platelets: 249 10*3/uL (ref 150–400)
RBC: 4.47 MIL/uL (ref 4.22–5.81)
RDW: 12.6 % (ref 11.5–15.5)
WBC: 10.6 10*3/uL — ABNORMAL HIGH (ref 4.0–10.5)
nRBC: 0 % (ref 0.0–0.2)

## 2022-01-29 LAB — BASIC METABOLIC PANEL
Anion gap: 10 (ref 5–15)
BUN: 10 mg/dL (ref 6–20)
CO2: 27 mmol/L (ref 22–32)
Calcium: 9.4 mg/dL (ref 8.9–10.3)
Chloride: 100 mmol/L (ref 98–111)
Creatinine, Ser: 0.79 mg/dL (ref 0.61–1.24)
GFR, Estimated: >60 mL/min (ref >60)
Glucose, Bld: 117 mg/dL — ABNORMAL HIGH (ref 70–99)
Potassium: 3.4 mmol/L — ABNORMAL LOW (ref 3.5–5.1)
Sodium: 137 mmol/L (ref 135–145)

## 2022-01-29 LAB — GLUCOSE, CAPILLARY
Glucose-Capillary: 139 mg/dL — ABNORMAL HIGH (ref 70–99)
Glucose-Capillary: 141 mg/dL — ABNORMAL HIGH (ref 70–99)
Glucose-Capillary: 83 mg/dL (ref 70–99)
Glucose-Capillary: 109 mg/dL — ABNORMAL HIGH (ref 70–99)

## 2022-01-29 LAB — CORTISOL-AM, BLOOD: Cortisol - AM: 16.6 ug/dL (ref 6.7–22.6)

## 2022-01-29 LAB — PROCALCITONIN: Procalcitonin: <0.1 ng/mL

## 2022-01-29 LAB — PROTIME-INR
INR: 1.1 (ref 0.8–1.2)
Prothrombin Time: 14.2 seconds (ref 11.4–15.2)

## 2022-01-29 LAB — HIV ANTIBODY (ROUTINE TESTING W REFLEX): HIV Screen 4th Generation wRfx: NONREACTIVE

## 2022-01-29 MED ORDER — JUVEN PO PACK
1.0000 | PACK | Freq: Two times a day (BID) | ORAL | Status: DC
  Administered 2022-01-31 – 2022-02-05 (×10): 1 via ORAL

## 2022-01-29 MED ORDER — ENSURE MAX PROTEIN PO LIQD
11.0000 [oz_av] | Freq: Every day | ORAL | Status: DC
  Administered 2022-01-29 – 2022-02-04 (×7): 11 [oz_av] via ORAL

## 2022-01-29 MED ORDER — ASCORBIC ACID 500 MG PO TABS
500.0000 mg | ORAL_TABLET | Freq: Two times a day (BID) | ORAL | Status: DC
  Administered 2022-01-29 – 2022-02-05 (×14): 500 mg via ORAL
  Filled 2022-01-29 (×14): qty 1

## 2022-01-29 MED ORDER — ZINC SULFATE 220 (50 ZN) MG PO CAPS
220.0000 mg | ORAL_CAPSULE | Freq: Every day | ORAL | Status: DC
  Administered 2022-01-29 – 2022-02-05 (×8): 220 mg via ORAL
  Filled 2022-01-29 (×8): qty 1

## 2022-01-29 MED ORDER — POTASSIUM CHLORIDE CRYS ER 20 MEQ PO TBCR
40.0000 meq | EXTENDED_RELEASE_TABLET | Freq: Once | ORAL | Status: AC
  Administered 2022-01-29: 40 meq via ORAL
  Filled 2022-01-29: qty 2

## 2022-01-29 MED ORDER — INSULIN ASPART 100 UNIT/ML IJ SOLN
0.0000 [IU] | Freq: Three times a day (TID) | INTRAMUSCULAR | Status: DC
  Administered 2022-01-29 – 2022-02-05 (×8): 2 [IU] via SUBCUTANEOUS
  Filled 2022-01-29 (×8): qty 1

## 2022-01-29 MED ORDER — ADULT MULTIVITAMIN W/MINERALS CH
1.0000 | ORAL_TABLET | Freq: Every day | ORAL | Status: DC
  Administered 2022-01-29 – 2022-02-05 (×8): 1 via ORAL
  Filled 2022-01-29 (×8): qty 1

## 2022-01-29 MED ORDER — ALPRAZOLAM 0.25 MG PO TABS
0.2500 mg | ORAL_TABLET | Freq: Two times a day (BID) | ORAL | Status: DC
  Administered 2022-01-29 – 2022-02-05 (×15): 0.25 mg via ORAL
  Filled 2022-01-29 (×15): qty 1

## 2022-01-29 MED ORDER — INSULIN ASPART 100 UNIT/ML IJ SOLN: 0.0000 [IU] | Freq: Every day | INTRAMUSCULAR | Status: DC

## 2022-01-29 NOTE — Progress Notes (Signed)
Initial Nutrition Assessment  DOCUMENTATION CODES:   Non-severe (moderate) malnutrition in context of chronic illness  INTERVENTION:   -500 mg vitamin C BID -220 mg zinc sulfate daily x 14 days -MVI with minerals daily -1 packet Juven BID, each packet provides 95 calories, 2.5 grams of protein (collagen), and 9.8 grams of carbohydrate (3 grams sugar); also contains 7 grams of L-arginine and L-glutamine, 300 mg vitamin C, 15 mg vitamin E, 1.2 mcg vitamin B-12, 9.5 mg zinc, 200 mg calcium, and 1.5 g  Calcium Beta-hydroxy-Beta-methylbutyrate to support wound healing  -Ensure Max po daily, each supplement provides 150 kcal and 30 grams of protein -Double protein portions with meals  NUTRITION DIAGNOSIS:   Moderate Malnutrition related to chronic illness (DM, peripheral neuropathy) as evidenced by mild fat depletion, mild muscle depletion.  GOAL:   Patient will meet greater than or equal to 90% of their needs  MONITOR:   PO intake, Supplement acceptance  REASON FOR ASSESSMENT:   Malnutrition Screening Tool    ASSESSMENT:   Pt with medical history significant for anxiety, type diabetes mellitus, GERD, hypertension, seizure disorder, peripheral neuropathy and pancreatitis, presented with acute onset of worsening right foot swelling with erythema as well as deteriorating ulcer on the right fifth MTP which has been there for a couple of months.  Pt admitted with sepsis due to cellulitis and acute osteomyelitis of rt foot.   Reviewed I/O's: -671 ml x 24 hours   UOP: 1.2 L x 24 hours   Case discussed with podiatry; plan for surgical debridement tomorrow. Also plan for ABIs today.   Spoke with pt and parents at bedside. Pt reports that that he has lost about 150# over the past 3 years intentionally; pt shares that he has tried to change his diet to manage his health and this has included fasting for 30 hours one a week, eliminating meat (expect chicken and fish), and taking multiple  vitamins. Pt admits that he is not not eating like he should and has been reliant on fruits, vegetables, and vitamins. Intake continues consists mainly of fruit smoothies, vegetables, and vitamins (pt takes hair, skin, and nails vitamin, vitamin C, and B-complex vitamin). Pt parents voice concern that pt is not receiving adequate protein and is too reliant and vitamins and supplements.   RD had long discussion with pt regarding importance of adequate protein intake, optimizing glycemic control, and wound healing. Pt is amenable to all recommendations that RD has to offer. He is amenable to vitamins and supplements.   Reviewed wt hx; no wt loss noted over the past 6 months.   Medications reviewed and include keppra.  Lab Results  Component Value Date   HGBA1C 5.7 (H) 01/29/2022   PTA DM medications are 100 mg [place] daily.   Labs reviewed: K: 3.4, CBGS: 83-141 (inpatient orders for glycemic control are 5 mg tradjenta daily, 0-15 units insulin aspart TID with meals and 0-5 units insulin aspart daily at bedtime).    NUTRITION - FOCUSED PHYSICAL EXAM:  Flowsheet Row Most Recent Value  Orbital Region Moderate depletion  Upper Arm Region Mild depletion  Thoracic and Lumbar Region No depletion  Buccal Region Mild depletion  Temple Region Moderate depletion  Clavicle Bone Region Mild depletion  Clavicle and Acromion Bone Region Mild depletion  Scapular Bone Region Mild depletion  Dorsal Hand No depletion  Patellar Region Mild depletion  Anterior Thigh Region Mild depletion  Posterior Calf Region Mild depletion  Edema (RD Assessment) None  Hair Reviewed  Eyes Reviewed  Mouth Reviewed  Skin Reviewed  Nails Reviewed       Diet Order:   Diet Order             Diet NPO time specified  Diet effective midnight           Diet Carb Modified Fluid consistency: Thin; Room service appropriate? Yes  Diet effective now                   EDUCATION NEEDS:   Education needs have been  addressed  Skin:  Skin Assessment: Reviewed RN Assessment  Last BM:  01/28/22  Height:   Ht Readings from Last 1 Encounters:  01/28/22 '6\' 1"'$  (1.854 m)    Weight:   Wt Readings from Last 1 Encounters:  01/28/22 108.4 kg    Ideal Body Weight:  83.6 kg  BMI:  Body mass index is 31.53 kg/m.  Estimated Nutritional Needs:   Kcal:  2300-2500  Protein:  125-150 grams  Fluid:  > 2 L    Loistine Chance, RD, LDN, [place] Registered Dietitian II Certified Diabetes Care and Education Specialist Please refer to [facility] for RD and/or RD on-call/weekend/after hours pager

## 2022-01-29 NOTE — Consult Note (Signed)
[facility] [facility] Consult Note  MRN : [id number]  Daniel Ritter is a 46 y.o. (1975-11-02) male who presents with chief complaint of  Chief Complaint  Patient presents with   Wound Check  .   Consulting Physician: Caroline More, D.P.M. Reason for consult: Bilateral foot wounds History of Present Illness: Daniel Ritter is a 46 year old male who presented to [facility] due to nonhealing wounds of both feet.  These wounds have been off and on for the last several months however they just suddenly became worse over the last month or so.  The patient currently has diabetes mellitus however notes that after significant weight loss his most recent A1c was below 5.  He denies any claudication-like symptoms.  He denies any rest pain like symptoms.  Does note some numbness and tingling to both feet.  In the midst of work-up it was noted that the patient had some calcification on x-ray leading to concern for possible peripheral arterial disease.    Current Facility-Administered Medications  Medication Dose Route Frequency Provider Last Rate Last Admin   acetaminophen (TYLENOL) tablet 650 mg  650 mg Oral Q6H PRN Mansy, Jan A, MD   650 mg at 01/29/22 1846   Or   acetaminophen (TYLENOL) suppository 650 mg  650 mg Rectal Q6H PRN Mansy, Jan A, MD       ALPRAZolam Duanne Moron) tablet 0.25 mg  0.25 mg Oral BID Priscella Mann, Sudheer B, MD   0.25 mg at 01/29/22 2233   amLODipine (NORVASC) tablet 10 mg  10 mg Oral Daily Mansy, Jan A, MD   10 mg at 01/29/22 0945   ascorbic acid (VITAMIN C) tablet 500 mg  500 mg Oral BID Ralene Muskrat B, MD   500 mg at 01/29/22 2233   DULoxetine (CYMBALTA) DR capsule 30 mg  30 mg Oral Daily Mansy, Jan A, MD   30 mg at 01/29/22 0945   enoxaparin (LOVENOX) injection 55 mg  55 mg Subcutaneous Q24H Mansy, Jan A, MD   55 mg at 01/29/22 2234   hydrochlorothiazide (HYDRODIURIL) tablet 12.5 mg  12.5 mg Oral Daily Mansy, Jan A, MD   12.5 mg at  01/29/22 0945   insulin aspart (novoLOG) injection 0-15 Units  0-15 Units Subcutaneous TID WC Ralene Muskrat B, MD   2 Units at 01/29/22 1234   insulin aspart (novoLOG) injection 0-5 Units  0-5 Units Subcutaneous QHS Ralene Muskrat B, MD       levETIRAcetam (KEPPRA) tablet 500 mg  500 mg Oral BID Mansy, Jan A, MD   500 mg at 01/29/22 2233   linagliptin (TRADJENTA) tablet 5 mg  5 mg Oral Daily Mansy, Jan A, MD   5 mg at 01/29/22 0946   lisinopril (ZESTRIL) tablet 20 mg  20 mg Oral Daily Mansy, Jan A, MD   20 mg at 01/29/22 0945   magnesium hydroxide (MILK OF MAGNESIA) suspension 30 mL  30 mL Oral Daily PRN Mansy, Jan A, MD       metoprolol succinate (TOPROL-XL) 24 hr tablet 100 mg  100 mg Oral Daily Mansy, Jan A, MD   100 mg at 01/29/22 0945   multivitamin with minerals tablet 1 tablet  1 tablet Oral Daily Ralene Muskrat B, MD   1 tablet at 01/29/22 1835   [START ON 01/30/2022] nutrition supplement (JUVEN) (JUVEN) powder packet 1 packet  1 packet Oral BID BM Sreenath, Sudheer B, MD       ondansetron (ZOFRAN) tablet  4 mg  4 mg Oral Q6H PRN Mansy, Jan A, MD       Or   ondansetron [facility]) injection 4 mg  4 mg Intravenous Q6H PRN Mansy, Jan A, MD       pantoprazole (PROTONIX) EC tablet 40 mg  40 mg Oral Daily Mansy, Jan A, MD   40 mg at 01/29/22 0945   piperacillin-tazobactam (ZOSYN) IVPB 3.375 g  3.375 g Intravenous Q8H Wynelle Cleveland, RPH 12.5 mL/hr at 01/29/22 2241 3.375 g at 01/29/22 2241   protein supplement (ENSURE MAX) liquid  11 oz Oral QHS Ralene Muskrat B, MD   11 oz at 01/29/22 2234   traZODone (DESYREL) tablet 25 mg  25 mg Oral QHS PRN Mansy, Jan A, MD       vancomycin (VANCOREADY) IVPB 1250 mg/250 mL  1,250 mg Intravenous Q12H Mansy, Jan A, MD 166.7 mL/hr at 01/29/22 1840 1,250 mg at 01/29/22 1840   zinc sulfate capsule 220 mg  220 mg Oral Daily Ralene Muskrat B, MD   220 mg at 01/29/22 1835    Past Medical History:  Diagnosis Date   Allergy    seasonal   Anxiety     Diabetes mellitus without complication ([place])    GERD (gastroesophageal reflux disease)    H/O ETOH abuse    History of rectal abscess    Hypertension 2007   Neuropathy    Pancreatitis    history of, pt states due to alcohol   Seizures ([place]) 08/2014   " two seizures in the hospital"   Stroke [facility]) 08/31/2013   Thrombocytopenia [facility])     Past Surgical History:  Procedure Laterality Date   [place], 1993   COLONOSCOPY N/A 09/04/2021   Procedure: COLONOSCOPY;  Surgeon: Toledo, Benay Pike, MD;  Location: [facility] ENDOSCOPY;  Service: Gastroenterology;  Laterality: N/A;  DM   INCISION AND DRAINAGE PERIRECTAL ABSCESS N/A 12/14/2015   Procedure: IRRIGATION AND DEBRIDEMENT PERIRECTAL ABSCESS;  Surgeon: Christene Lye, MD;  Location: [facility] ORS;  Service: General;  Laterality: N/A;    Social History Social History   Tobacco Use   Smoking status: Former    Packs/day: 0.25    Years: 18.00    Total pack years: 4.50    Types: Cigarettes    Quit date: 04/11/2020    Years since quitting: 1.8   Smokeless tobacco: Former    Types: Snuff    Quit date: 2001   Tobacco comments:    patient quit for 3 years and then started back . Currently quit 3 weeks ago   Vaping Use   Vaping Use: Never used  Substance Use Topics   Alcohol use: Yes    Alcohol/week: 0.0 standard drinks of alcohol    Comment: quit drinking about 1 week ago   Drug use: Not Currently    Types: Marijuana    Comment: last time he had was 2 weeks ago     Family History Family History  Problem Relation Age of Onset   Hypertension Mother    Anxiety disorder Mother    Hypertension Father    Hypertension Brother    Leukemia Maternal Grandfather    Lung cancer Paternal Grandfather     No Known Allergies   REVIEW OF SYSTEMS (Negative unless checked)  Constitutional: '[]'$ Weight loss  '[]'$ Fever  '[]'$ Chills Cardiac: '[]'$ Chest pain   '[]'$ Chest pressure   '[]'$ Palpitations   '[]'$ Shortness of breath when  laying flat   '[]'$ Shortness of breath  at rest   '[]'$ Shortness of breath with exertion. Vascular:  '[]'$ Pain in legs with walking   '[]'$ Pain in legs at rest   '[]'$ Pain in legs when laying flat   '[]'$ Claudication   '[]'$ Pain in feet when walking  '[]'$ Pain in feet at rest  '[]'$ Pain in feet when laying flat   '[]'$ History of DVT   '[]'$ Phlebitis   '[]'$ Swelling in legs   '[]'$ Varicose veins   '[]'$ Non-healing ulcers Pulmonary:   '[]'$ Uses home oxygen   '[]'$ Productive cough   '[]'$ Hemoptysis   '[]'$ Wheeze  '[]'$ COPD   '[]'$ Asthma Neurologic:  '[]'$ Dizziness  '[]'$ Blackouts   '[]'$ Seizures   '[]'$ History of stroke   '[]'$ History of TIA  '[]'$ Aphasia   '[]'$ Temporary blindness   '[]'$ Dysphagia   '[]'$ Weakness or numbness in arms   '[]'$ Weakness or numbness in legs Musculoskeletal:  '[]'$ Arthritis   '[]'$ Joint swelling   '[]'$ Joint pain   '[]'$ Low back pain Hematologic:  '[]'$ Easy bruising  '[]'$ Easy bleeding   '[]'$ Hypercoagulable state   '[]'$ Anemic  '[]'$ Hepatitis Gastrointestinal:  '[]'$ Blood in stool   '[]'$ Vomiting blood  '[]'$ Gastroesophageal reflux/heartburn   '[]'$ Difficulty swallowing. Genitourinary:  '[]'$ Chronic kidney disease   '[]'$ Difficult urination  '[]'$ Frequent urination  '[]'$ Burning with urination   '[]'$ Blood in urine Skin:  '[]'$ Rashes   '[x]'$ Ulcers   '[x]'$ Wounds Psychological:  '[]'$ History of anxiety   '[]'$  History of major depression.  Physical Examination  Vitals:   01/29/22 0735 01/29/22 1204 01/29/22 1621 01/29/22 1957  BP: 123/88 104/75 125/88 103/67  Pulse: 91 79 80 70  Resp: '16 16 17 20  '$ Temp: 98 F (36.7 C) 98.6 F (37 C) 99.4 F (37.4 C) 98.7 F (37.1 C)  TempSrc:      SpO2: 98% 97% 99% 95%  Weight:      Height:       Body mass index is 31.53 kg/m. Gen:  WD/WN, NAD Head: [place]/AT, No temporalis wasting. Prominent temp pulse not noted. Ear/Nose/Throat: Hearing grossly intact, nares w/o erythema or drainage, oropharynx w/o Erythema/Exudate Eyes: Sclera non-icteric, conjunctiva clear Neck: Trachea midline.  No JVD.  Pulmonary:  Good air movement, respirations not labored, equal bilaterally.  Cardiac: RRR,  normal S1, S2. Vascular: Bilateral feet warm Nonpalpable pedal pulses bilaterally gastrointestinal: soft, non-tender/non-distended. No guarding/reflex.  Musculoskeletal: M/S 5/5 throughout.  Extremities without ischemic changes.  No deformity or atrophy. No edema. Neurologic: Sensation grossly intact in extremities.  Symmetrical.  Speech is fluent. Motor exam as listed above. Psychiatric: Judgment intact, Mood & affect appropriate for pt's clinical situation.  Lymph : No Cervical, Axillary, or Inguinal lymphadenopathy.    CBC Lab Results  Component Value Date   WBC 10.6 (H) 01/29/2022   HGB 13.4 01/29/2022   HCT 39.8 01/29/2022   MCV 89.0 01/29/2022   PLT 249 01/29/2022    BMET    Component Value Date/Time   NA 137 01/29/2022 0506   NA 140 11/08/2014 0851   NA 136 09/20/2011 0727   K 3.4 (L) 01/29/2022 0506   K 4.0 10/10/2011 1407   CL 100 01/29/2022 0506   CL 103 09/20/2011 0727   CO2 27 01/29/2022 0506   CO2 21 09/20/2011 0727   GLUCOSE 117 (H) 01/29/2022 0506   GLUCOSE 153 (H) 09/20/2011 0727   BUN 10 01/29/2022 0506   BUN 13 11/08/2014 0851   BUN 11 09/20/2011 0727   CREATININE 0.79 01/29/2022 0506   CREATININE 0.75 10/10/2011 1407   CALCIUM 9.4 01/29/2022 0506   CALCIUM 9.2 09/20/2011 0727   GFRNONAA >60 01/29/2022 0506   GFRNONAA >60 10/10/2011  1407   GFRAA >60 12/12/2015 1540   GFRAA >60 10/10/2011 1407   Estimated Creatinine Clearance: 147.4 mL/min (by C-G formula based on SCr of 0.79 mg/dL).  COAG Lab Results  Component Value Date   INR 1.1 01/29/2022   INR 1.1 01/28/2022   INR 1.1 05/02/2020    Radiology US ARTERIAL ABI (SCREENING LOWER EXTREMITY)  Result Date: 01/29/2022 CLINICAL DATA:  Wound/cellulitis EXAM: NONINVASIVE PHYSIOLOGIC VASCULAR STUDY OF BILATERAL LOWER EXTREMITIES TECHNIQUE: Evaluation of both lower extremities were performed at rest, including calculation of ankle-brachial indices with single level Doppler, pressure and pulse  volume recording. COMPARISON:  None Available. FINDINGS: Right ABI:  Unable to calculate Left ABI:  1.0 Right Lower Extremity:  Biphasic arterial waveforms. Left Lower Extremity:  Biphasic arterial waveforms. 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 This is typically seen in the setting of medial arteriosclerosis. Signed, Criselda Peaches, MD, [place] [facility] Radiology Electronically Signed   By: Jacqulynn Cadet M.D.   On: 01/29/2022 16:16   DG Foot Complete Left  Result Date: 01/29/2022 CLINICAL DATA:  Foot pain and swelling. EXAM: LEFT FOOT - COMPLETE 3+ VIEW COMPARISON:  Diabetic foot ulcer. FINDINGS: There appears to be an open wound on the plantar aspect of the forefoot. Advanced small vessel calcifications consistent with known diabetes. No definite plain film findings for septic arthritis or osteomyelitis. MRI may be helpful for further evaluation. IMPRESSION: 1. Open wound on the plantar aspect of the forefoot. 2. No definite plain film findings for septic arthritis or osteomyelitis. Electronically Signed   By: Marijo Sanes M.D.   On: 01/29/2022 13:49   MR FOOT RIGHT W WO CONTRAST  Result Date: 01/29/2022 CLINICAL DATA:  Diabetic foot ulcer EXAM: MRI OF THE RIGHT FOREFOOT WITHOUT AND WITH CONTRAST TECHNIQUE: Multiplanar, multisequence MR imaging of the right foot was performed before and after the administration of intravenous contrast. CONTRAST:  58m GADAVIST GADOBUTROL 1 MMOL/ML IV SOLN COMPARISON:  Radiographs, same date. FINDINGS: There is an open wound noted along the lateral aspect of the fifth metatarsal head. Gas is noted in the soft tissues along the dorsum fifth. Elongated rim enhancing fluid collection tracking back along the fifth metatarsal  shaft consistent a dissecting abscess. There are also areas of nonenhancing soft tissue worrisome for de vascularized/necrotic tissues. Small fifth MTP joint effusion and signal intensity in the fifth metatarsal head and neck and subsequent enhancement consistent with septic arthritis and osteomyelitis. Diffuse myofasciitis without definite findings for pyomyositis. The other bony structures are intact. IMPRESSION: 1. Open wound along the lateral aspect of the fifth metatarsal head with underlying abscess, probable necrotic soft tissue and diffuse cellulitis and myofasciitis. 2. MR findings consistent with septic arthritis at the fifth MTP joint and associated osteomyelitis involving the fifth metatarsal head and neck. 3. Elongated rim enhancing fluid collection tracking back along the fifth metatarsal shaft consistent with a dissecting abscess. 4. Diffuse myofasciitis without definite findings for pyomyositis. Electronically Signed   By: PMarijo SanesM.D.   On: 01/29/2022 08:17   DG Foot Complete Right  Result Date: 01/28/2022 CLINICAL DATA:  Pain and swelling, open wound in the skin EXAM: RIGHT FOOT COMPLETE - 3+ VIEW COMPARISON:  None Available. FINDINGS: There are multiple pockets of air in the soft tissues adjacent to the fifth metatarsophalangeal joint. In the AP view,  faint lucencies are noted in the head of the fifth metatarsal which could not be confirmed in the oblique projection. No recent displaced fracture or dislocation is seen. Extensive arterial calcifications are seen in soft tissues. There is soft tissue swelling over the dorsum of the foot. IMPRESSION: There are pockets of air in the soft tissues adjacent to the right fifth metatarsophalangeal joint suggesting possible infectious process. Possible small lucencies are seen in the adjacent bony structures. Possibility of osteomyelitis in the head of fifth metatarsal and base of proximal phalanx of right fifth toe is not excluded. If clinically  warranted, follow-up MRI or three-phase bone scan may be considered. Electronically Signed   By: Elmer Picker M.D.   On: 01/28/2022 13:30   DG Chest 2 View  Result Date: 01/28/2022 CLINICAL DATA:  Possible sepsis EXAM: CHEST - 2 VIEW COMPARISON:  09/17/2011 FINDINGS: The heart size and mediastinal contours are within normal limits. Both lungs are clear. The visualized skeletal structures are unremarkable. IMPRESSION: No active cardiopulmonary disease. Electronically Signed   By: Elmer Picker M.D.   On: 01/28/2022 13:26      Assessment/Plan 1.  Peripheral arterial disease The patient likely has some degree of peripheral arterial disease.  We will order ABIs to better stratify.  If ABIs are normal without significant disease we can consider conservative approach with close follow-up.  However, if wounds are drastically altered we may consider angiogram.  This was discussed with patient and he is in agreement.  We will have a follow-up discussion following his ABIs.   Family Communication:  Thank you for allowing [place] to participate in the care of this patient.   Kris Hartmann, NP [facility] [phone] (Office Phone) [phone] (Office Fax) [phone] (Pager)  01/29/2022 11:31 PM  Staff may message me via secure chat in [place]  but this may not receive immediate response,  please page for urgent matters!  Dictation software was used to generate the above note. Typos may occur and escape review, as with typed/written notes. Any error is purely unintentional.  Please contact me directly for clarity if needed.

## 2022-01-29 NOTE — Plan of Care (Signed)
  Problem: Respiratory: Goal: Ability to maintain adequate ventilation will improve Outcome: Progressing   Problem: Education: Goal: Knowledge of General Education information will improve Description: Including pain rating scale, medication(s)/side effects and non-pharmacologic comfort measures Outcome: Progressing   Problem: Health Behavior/Discharge Planning: Goal: Ability to manage health-related needs will improve Outcome: Progressing   

## 2022-01-29 NOTE — Consult Note (Addendum)
PODIATRY / FOOT AND ANKLE SURGERY CONSULTATION NOTE  Requesting Physician: Dr. Sidney Ace  Reason for consult: R and L foot wounds, infection  Chief Complaint: Right and left foot wounds   HPI: Daniel Ritter is a 46 y.o. male who presents with nonhealing wounds to both feet.  He was admitted to [facility] due to concerns for osteomyelitis to the right foot.  Patient had work-up of the right foot consisting of x-ray imaging as well as MRI which did show septic joint to the fifth metatarsal phalangeal joint of the right side.  No work-up was performed on the left side unfortunately.  Patient currently denies nausea, vomiting, fevers, chills.  He has noticed these wounds now off and on for several months.  He believes that they just recently got worse.  Patient denies any pain currently.  He notes that he has numbness and tingling to both feet.  He notes no calf pain with ambulation or when at rest.  PMHx:  Past Medical History:  Diagnosis Date   Allergy    seasonal   Anxiety    Diabetes mellitus without complication ([place])    GERD (gastroesophageal reflux disease)    H/O ETOH abuse    History of rectal abscess    Hypertension 2007   Neuropathy    Pancreatitis    history of, pt states due to alcohol   Seizures ([place]) 08/2014   " two seizures in the hospital"   Stroke [facility]) 08/31/2013   Thrombocytopenia [facility])     Surgical Hx:  Past Surgical History:  Procedure Laterality Date   ANTERIOR CRUCIATE LIGAMENT REPAIR Left 1992, 1993   COLONOSCOPY N/A 09/04/2021   Procedure: COLONOSCOPY;  Surgeon: Toledo, Benay Pike, MD;  Location: [facility] ENDOSCOPY;  Service: Gastroenterology;  Laterality: N/A;  DM   INCISION AND DRAINAGE PERIRECTAL ABSCESS N/A 12/14/2015   Procedure: IRRIGATION AND DEBRIDEMENT PERIRECTAL ABSCESS;  Surgeon: Christene Lye, MD;  Location: [facility] ORS;  Service: General;  Laterality: N/A;    FHx:  Family History  Problem Relation Age of Onset    Hypertension Mother    Anxiety disorder Mother    Hypertension Father    Hypertension Brother    Leukemia Maternal Grandfather    Lung cancer Paternal Grandfather     Social History:  reports that he quit smoking about 21 months ago. His smoking use included cigarettes. He has a 4.50 pack-year smoking history. He quit smokeless tobacco use about 22 years ago.  His smokeless tobacco use included snuff. He reports current alcohol use. He reports that he does not currently use drugs after having used the following drugs: Marijuana.  Allergies: No Known Allergies  Medications Prior to Admission  Medication Sig Dispense Refill   ALPRAZolam (XANAX) 0.25 MG tablet Take 0.25 mg by mouth 2 (two) times daily.     amLODipine (NORVASC) 10 MG tablet Take 10 mg by mouth daily as needed.     DULoxetine (CYMBALTA) 30 MG capsule Take 30 mg by mouth daily.     JANUVIA 100 MG tablet Take 100 mg by mouth daily.     levETIRAcetam (KEPPRA) 500 MG tablet Take 1 tablet (500 mg total) by mouth 2 (two) times daily. 30 tablet 2   lisinopril-hydrochlorothiazide (PRINZIDE,ZESTORETIC) 20-12.5 MG per tablet Take 1 tablet by mouth daily.     metFORMIN (GLUCOPHAGE) 1000 MG tablet Take 1,000 mg by mouth 2 (two) times daily with a meal.     metoprolol succinate (TOPROL-XL) 100 MG  24 hr tablet Take 100 mg by mouth at bedtime. Take with or immediately following a meal.     omeprazole (PRILOSEC) 20 MG capsule Take 20 mg by mouth 2 (two) times daily before a meal.     ALPRAZolam (XANAX) 1 MG tablet Take by mouth at bedtime as needed. (Patient not taking: Reported on 01/28/2022)  1   ferrous sulfate 325 (65 FE) MG tablet Take 325 mg by mouth 2 (two) times daily with a meal. (Patient not taking: Reported on 01/28/2022)     gabapentin (NEURONTIN) 800 MG tablet Take 400 mg by mouth 3 (three) times daily. 1-2 by mouth 3 times daily (Patient not taking: Reported on 01/28/2022)     potassium chloride (MICRO-K) 10 MEQ CR capsule Take 10  mEq by mouth daily. (Patient not taking: Reported on 01/28/2022)     vitamin B-12 (CYANOCOBALAMIN) 250 MCG tablet Take 250 mcg by mouth in the morning and at bedtime. (Patient not taking: Reported on 01/28/2022)     Vitamin D, Ergocalciferol, (DRISDOL) 1.25 MG (50000 UNIT) CAPS capsule Take 50,000 Units by mouth every 7 (seven) days. (Patient not taking: Reported on 01/28/2022)      Physical Exam: General: Alert and oriented.  No apparent distress.  Vascular: DP/PT pulses nonpalpable bilateral.  Capillary fill time appears to be intact to digits bilaterally.  No hair growth noted to digits bilaterally.  Mild bilateral lower extremity nonpitting edema.  Both feet do appear to be warm to touch.  Neuro: Light touch sensation nearly absent to bilateral lower extremities.  Derm: Open wound present to the plantar and dorsal lateral aspect of the right fifth metatarsal phalange joint with obvious bone exposed, wound beds appear to be fibronecrotic with scant areas of granularity, obvious bone exposed through the wound, mild odor, slight erythema and edema present to the area with skin sloughing.       Open wound present to the left plantar second metatarsal phalangeal joint and first interspace.  Both wounds do probe to bone to this area as well to the base the proximal phalanx of the second toe as well as the second metatarsal phalangeal joint/second metatarsal head.  Wound beds appear to be fibronecrotic with areas of granular tissue as well, mild maceration around the wounds in general, mild associated erythema and edema, mild odor.      MSK: 5/5 strength bilateral lower extremity muscle groups.  Results for orders placed or performed during the hospital encounter of 01/28/22 (from the past 48 hour(s))  Comprehensive metabolic panel     Status: Abnormal   Collection Time: 01/28/22 12:51 PM  Result Value Ref Range   Sodium 136 135 - 145 mmol/L   Potassium 3.9 3.5 - 5.1 mmol/L   Chloride 98  98 - 111 mmol/L   CO2 28 22 - 32 mmol/L   Glucose, Bld 154 (H) 70 - 99 mg/dL    Comment: Glucose reference range applies only to samples taken after fasting for at least 8 hours.   BUN 15 6 - 20 mg/dL   Creatinine, Ser 1.06 0.61 - 1.24 mg/dL   Calcium 9.9 8.9 - 10.3 mg/dL   Total Protein 9.5 (H) 6.5 - 8.1 g/dL   Albumin 3.9 3.5 - 5.0 g/dL   AST 36 15 - 41 U/L   ALT 23 0 - 44 U/L   Alkaline Phosphatase 82 38 - 126 U/L   Total Bilirubin 0.9 0.3 - 1.2 mg/dL   GFR, Estimated >60 >60 mL/min  Comment: (NOTE) Calculated using the CKD-EPI Creatinine Equation (2021)    Anion gap 10 5 - 15    Comment: Performed at [facility], [address] [place], [place] [address]  Lactic acid, plasma     Status: None   Collection Time: 01/28/22 12:51 PM  Result Value Ref Range   Lactic Acid, Venous 0.9 0.5 - 1.9 mmol/L    Comment: Performed at [facility], [address] [place], [place] [address]  CBC with Differential     Status: Abnormal   Collection Time: 01/28/22 12:51 PM  Result Value Ref Range   WBC 15.7 (H) 4.0 - 10.5 K/uL   RBC 4.93 4.22 - 5.81 MIL/uL   Hemoglobin 14.9 13.0 - 17.0 g/dL   HCT 43.6 39.0 - 52.0 %   MCV 88.4 80.0 - 100.0 fL   MCH 30.2 26.0 - 34.0 pg   MCHC 34.2 30.0 - 36.0 g/dL   RDW 12.8 11.5 - 15.5 %   Platelets 311 150 - 400 K/uL   nRBC 0.0 0.0 - 0.2 %   Neutrophils Relative % 80 %   Neutro Abs 12.6 (H) 1.7 - 7.7 K/uL   Lymphocytes Relative 11 %   Lymphs Abs 1.8 0.7 - 4.0 K/uL   Monocytes Relative 5 %   Monocytes Absolute 0.9 0.1 - 1.0 K/uL   Eosinophils Relative 1 %   Eosinophils Absolute 0.1 0.0 - 0.5 K/uL   Basophils Relative 1 %   Basophils Absolute 0.2 (H) 0.0 - 0.1 K/uL   Immature Granulocytes 2 %   Abs Immature Granulocytes 0.26 (H) 0.00 - 0.07 K/uL    Comment: Performed at [facility], [address] [place], [place] [address]  Protime-INR     Status: None   Collection Time: 01/28/22 12:51 PM  Result Value Ref Range    Prothrombin Time 14.0 11.4 - 15.2 seconds   INR 1.1 0.8 - 1.2    Comment: (NOTE) INR goal varies based on device and disease states. Performed at [facility], [address] [place], [place] [address]   Culture, blood (Routine x 2)     Status: None (Preliminary result)   Collection Time: 01/28/22 12:51 PM   Specimen: BLOOD  Result Value Ref Range   Specimen Description BLOOD LEFT ANTECUBITAL    Special Requests      BOTTLES DRAWN AEROBIC AND ANAEROBIC Blood Culture adequate volume   Culture      NO GROWTH < 24 HOURS Performed at [facility], [address], [place], [place] [address]    Report Status PENDING   Culture, blood (Routine x 2)     Status: None (Preliminary result)   Collection Time: 01/28/22  3:37 PM   Specimen: BLOOD  Result Value Ref Range   Specimen Description BLOOD BLOOD LEFT HAND    Special Requests      BOTTLES DRAWN AEROBIC AND ANAEROBIC Blood Culture results may not be optimal due to an inadequate volume of blood received in culture bottles   Culture      NO GROWTH < 24 HOURS Performed at [facility], [address], [place], [place] [address]    Report Status PENDING   Lactic acid, plasma     Status: None   Collection Time: 01/28/22  6:40 PM  Result Value Ref Range   Lactic Acid, Venous 1.1 0.5 - 1.9 mmol/L    Comment: Performed at [facility], [address] [place], [place]  [address]  HIV Antibody (routine testing w rflx)     Status: None   Collection Time: 01/28/22  6:40 PM  Result Value Ref Range   HIV Screen 4th Generation wRfx Non Reactive Non Reactive    Comment: Performed at [facility], [place] [address] [place], [place] [address]  Protime-INR     Status: None   Collection Time: 01/29/22  5:06 AM  Result Value Ref Range   Prothrombin Time 14.2 11.4 - 15.2 seconds   INR 1.1 0.8 - 1.2    Comment: (NOTE) INR goal varies based on device and disease states. Performed at [facility], [address] [place], [place] [address]   Procalcitonin     Status: None   Collection Time: 01/29/22  5:06 AM  Result Value Ref Range   Procalcitonin <0.10 ng/mL    Comment:        Interpretation: PCT (Procalcitonin) <= 0.5 ng/mL: Systemic infection (sepsis) is not likely. Local bacterial infection is possible. (NOTE)       Sepsis PCT Algorithm           Lower Respiratory Tract                                      Infection PCT Algorithm    ----------------------------     ----------------------------         PCT < 0.25 ng/mL                PCT < 0.10 ng/mL          Strongly encourage             Strongly discourage   discontinuation of antibiotics    initiation of antibiotics    ----------------------------     -----------------------------       PCT 0.25 - 0.50 ng/mL            PCT 0.10 - 0.25 ng/mL               OR       >80% decrease in PCT            Discourage initiation of                                            antibiotics      Encourage discontinuation           of antibiotics    ----------------------------     -----------------------------         PCT >= 0.50 ng/mL              PCT 0.26 - 0.50 ng/mL               AND        <80% decrease in PCT             Encourage initiation of                                             antibiotics       Encourage continuation           of antibiotics    ----------------------------     -----------------------------  PCT >= 0.50 ng/mL                  PCT > 0.50 ng/mL               AND         increase in PCT                  Strongly encourage                                      initiation of antibiotics    Strongly encourage escalation           of antibiotics                                     -----------------------------                                           PCT <= 0.25 ng/mL                                                 OR                                        > 80% decrease in PCT                                       Discontinue / Do not initiate                                             antibiotics  Performed at [facility], [address] [place], [place] [address]   Basic metabolic panel     Status: Abnormal   Collection Time: 01/29/22  5:06 AM  Result Value Ref Range   Sodium 137 135 - 145 mmol/L   Potassium 3.4 (L) 3.5 - 5.1 mmol/L   Chloride 100 98 - 111 mmol/L   CO2 27 22 - 32 mmol/L   Glucose, Bld 117 (H) 70 - 99 mg/dL    Comment: Glucose reference range applies only to samples taken after fasting for at least 8 hours.   BUN 10 6 - 20 mg/dL   Creatinine, Ser 0.79 0.61 - 1.24 mg/dL   Calcium 9.4 8.9 - 10.3 mg/dL   GFR, Estimated >60 >60 mL/min    Comment: (NOTE) Calculated using the CKD-EPI Creatinine Equation (2021)    Anion gap 10 5 - 15    Comment: Performed at [facility], [address] [place], [place] [address]  CBC     Status: Abnormal   Collection Time: 01/29/22  5:06 AM  Result Value Ref Range   WBC 10.6 (H) 4.0 - 10.5 K/uL   RBC 4.47 4.22 - 5.81 MIL/uL  Hemoglobin 13.4 13.0 - 17.0 g/dL   HCT 39.8 39.0 - 52.0 %   MCV 89.0 80.0 - 100.0 fL   MCH 30.0 26.0 - 34.0 pg   MCHC 33.7 30.0 - 36.0 g/dL   RDW 12.6 11.5 - 15.5 %   Platelets 249 150 - 400 K/uL   nRBC 0.0 0.0 - 0.2 %    Comment: Performed at [facility], [address] [place], [place] [address]  Glucose, capillary     Status: Abnormal   Collection Time: 01/29/22  7:40 AM  Result Value Ref Range   Glucose-Capillary 139 (H) 70 - 99 mg/dL    Comment: Glucose reference range applies only to samples taken after fasting for at least 8 hours.  Glucose, capillary     Status: Abnormal   Collection Time: 01/29/22 12:09 PM  Result Value Ref Range   Glucose-Capillary 141 (H) 70 - 99 mg/dL    Comment: Glucose reference range applies only to samples taken after fasting for at least 8 hours.   MR FOOT RIGHT W WO CONTRAST  Result Date: 01/29/2022 CLINICAL  DATA:  Diabetic foot ulcer EXAM: MRI OF THE RIGHT FOREFOOT WITHOUT AND WITH CONTRAST TECHNIQUE: Multiplanar, multisequence MR imaging of the right foot was performed before and after the administration of intravenous contrast. CONTRAST:  23m GADAVIST GADOBUTROL 1 MMOL/ML IV SOLN COMPARISON:  Radiographs, same date. FINDINGS: There is an open wound noted along the lateral aspect of the fifth metatarsal head. Gas is noted in the soft tissues along the dorsum fifth. Elongated rim enhancing fluid collection tracking back along the fifth metatarsal shaft consistent a dissecting abscess. There are also areas of nonenhancing soft tissue worrisome for de vascularized/necrotic tissues. Small fifth MTP joint effusion and signal intensity in the fifth metatarsal head and neck and subsequent enhancement consistent with septic arthritis and osteomyelitis. Diffuse myofasciitis without definite findings for pyomyositis. The other bony structures are intact. IMPRESSION: 1. Open wound along the lateral aspect of the fifth metatarsal head with underlying abscess, probable necrotic soft tissue and diffuse cellulitis and myofasciitis. 2. MR findings consistent with septic arthritis at the fifth MTP joint and associated osteomyelitis involving the fifth metatarsal head and neck. 3. Elongated rim enhancing fluid collection tracking back along the fifth metatarsal shaft consistent with a dissecting abscess. 4. Diffuse myofasciitis without definite findings for pyomyositis. Electronically Signed   By: PMarijo SanesM.D.   On: 01/29/2022 08:17   DG Foot Complete Right  Result Date: 01/28/2022 CLINICAL DATA:  Pain and swelling, open wound in the skin EXAM: RIGHT FOOT COMPLETE - 3+ VIEW COMPARISON:  None Available. FINDINGS: There are multiple pockets of air in the soft tissues adjacent to the fifth metatarsophalangeal joint. In the AP view, faint lucencies are noted in the head of the fifth metatarsal which could not be confirmed in the  oblique projection. No recent displaced fracture or dislocation is seen. Extensive arterial calcifications are seen in soft tissues. There is soft tissue swelling over the dorsum of the foot. IMPRESSION: There are pockets of air in the soft tissues adjacent to the right fifth metatarsophalangeal joint suggesting possible infectious process. Possible small lucencies are seen in the adjacent bony structures. Possibility of osteomyelitis in the head of fifth metatarsal and base of proximal phalanx of right fifth toe is not excluded. If clinically warranted, follow-up MRI or three-phase bone scan may be considered. Electronically Signed   By: PElmer PickerM.D.   On: 01/28/2022 13:30  DG Chest 2 View  Result Date: 01/28/2022 CLINICAL DATA:  Possible sepsis EXAM: CHEST - 2 VIEW COMPARISON:  09/17/2011 FINDINGS: The heart size and mediastinal contours are within normal limits. Both lungs are clear. The visualized skeletal structures are unremarkable. IMPRESSION: No active cardiopulmonary disease. Electronically Signed   By: Elmer Picker M.D.   On: 01/28/2022 13:26    Blood pressure 104/75, pulse 79, temperature 98.6 F (37 C), resp. rate 16, height '6\' 1"'$  (1.854 m), weight 108.4 kg, SpO2 97 %.  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 -X-ray imaging and MRI imaging reviewed of the right foot showing septic joint to the fifth metatarsal phalangeal joint. -Clinically wounds probe to bone at the fifth metatarsal phalange joint of the right foot and left second metatarsal phalangeal joint. -Further work-up needed to examine for further infection to the left second metatarsal phalangeal joint.  X-ray imaging ordered.  MRI may be warranted if  x-ray imaging is inconclusive on extent of infection. -Wound culture ordered and taken today from the right foot wound. -Appreciate medicine recommendations for antibiotic therapy. -Vascular consulted due to nonpalpable pulses and arterial calcification seen on x-ray.  Appreciate recommendations.  Would recommend starting with ABIs and further work-up if needed.  Patient denies claudication type symptoms today. -Applied Betadine wet-to-dry dressings to both feet. -Surgical shoes ordered for both feet.  Instructed patient on partial weightbearing heel contact. -All treatment options were discussed with patient both conservative and surgical attempts at correction include potential risks and complications at this time patient is elected for surgical intervention consisting of right partial fifth ray amputation, left partial second ray amputation.  Discussed that there is a chance that the incisions may not be able to be closed as well due to the extent of wounds that are present.  Patient may require local wound care as well as wound VAC therapy.  No guarantees given.  Verbal consent obtained. -Patient to likely have surgery at some point tomorrow with Dr. Vickki Muff at midday.  Patient to be n.p.o. at midnight tonight for surgical intervention tomorrow.  Caroline More, DPM 01/29/2022, 12:50 PM

## 2022-01-29 NOTE — Plan of Care (Signed)
  Problem: Respiratory: Goal: Ability to maintain adequate ventilation will improve 01/29/2022 1915 by Marisa Sprinkles, LPN Outcome: Progressing 01/29/2022 1914 by Marisa Sprinkles, LPN Outcome: Progressing   Problem: Education: Goal: Knowledge of General Education information will improve Description: Including pain rating scale, medication(s)/side effects and non-pharmacologic comfort measures 01/29/2022 1915 by Marisa Sprinkles, LPN Outcome: Progressing 01/29/2022 1914 by Marisa Sprinkles, LPN Outcome: Progressing   Problem: Clinical Measurements: Goal: Will remain free from infection 01/29/2022 1915 by Marisa Sprinkles, LPN Outcome: Progressing 01/29/2022 1914 by Marisa Sprinkles, LPN Outcome: Progressing

## 2022-01-29 NOTE — Plan of Care (Signed)

## 2022-01-29 NOTE — Progress Notes (Signed)
PROGRESS NOTE    KORREY SCHLEICHER  HCW:[id number] DOB: Apr 28, 1976 DOA: 01/28/2022 PCP: Theotis Burrow, MD    Brief Narrative:   46 y.o. Caucasian male with medical history significant for anxiety, type diabetes mellitus, GERD, hypertension, seizure disorder, peripheral neuropathy and pancreatitis, presented to the emergency room with acute onset of worsening right foot swelling with erythema as well as deteriorating ulcer on the right fifth MTP which has been there for a couple of months.  Patient thought it was initially healing and later on noticed worsening redness and purulent discharge since Wednesday.  He also noticed a black bump per his report.  Since then erythematous been extending to his left foot dorsum.  He has a scab in his left first web space and plantar ulcer on the left foot which has not significantly worsened.  He denies any chest pain or dyspnea or cough.  No nausea or vomiting or abdominal pain.  No fever or chills.  No dysuria, oliguria or hematuria or flank pain.  MRI confirmed septic arthritis with associated osteomyelitis.  Seen by podiatry.  Plan for surgical intervention 7/27.  Assessment & Plan:   Principal Problem:   Sepsis due to cellulitis [facility]) Active Problems:   Acute osteomyelitis of ankle or foot, right (HCC)   Anxiety and depression   Essential hypertension   Seizures (HCC)   Type 2 diabetes mellitus with peripheral neuropathy (HCC)  Acute osteomyelitis and septic arthritis of right foot Overlying cellulitis Sepsis secondary to above Sepsis physiology criteria met by tachycardia leukocytosis MRI confirms osteomyelitis with associated septic arthritis Wound with minimal drainage, tender to touch Plan: Continue broad-spectrum IV antibiotics Follow blood and wound cultures, NGTD Intravenous fluid hydration Podiatry consult Plan for operative fixation 7/27 Okay for diet today, n.p.o. after midnight  Type 2 diabetes mellitus with peripheral  neuropathy Per patient last hemoglobin A1c was in the fours Last A1c in our system 6.0 Plan: Hold oral metformin Okay for Tradjenta Sliding-scale coverage PTA metformin  Anxiety and depression PTA Xanax and Cymbalta  History of seizure disorder PTA Keppra  Essential hypertension BP controlled Continue PTA regimen of Toprol-XL, lisinopril, amlodipine, hydrochlorothiazide Vital per unit protocol  GERD PPI     DVT prophylaxis: SQ Lovenox Code Status: Full Family Communication: None Disposition Plan: Status is: Inpatient Remains inpatient appropriate because: Acute osteomyelitis of right foot.  Plan for surgical intervention 7/27   Level of care: Med-Surg  Consultants:  Podiatry  Procedures:  None  Antimicrobials: Vancomycin Zosyn   Subjective: Seen and examined.  Resting comfortably in bed.  No visible distress.  No complaints of pain.  Objective: Vitals:   01/28/22 2234 01/29/22 0458 01/29/22 0735 01/29/22 1204  BP: 135/82 122/85 123/88 104/75  Pulse: (!) 101 100 91 79  Resp: _0 Temp: 98.5 F (36.9 C) 98.8 F (37.1 C) 98 F (36.7 C) 98.6 F (37 C)  TempSrc:      SpO2: 97% 97% 98% 97%  Weight:      Height:        Intake/Output Summary (Last 24 hours) at 01/29/2022 1356 Last data filed at 01/29/2022 1100 Gross per 24 hour  Intake 720 ml  Output 1351 ml  Net -631 ml   Filed Weights   01/28/22 1242  Weight: 108.4 kg    Examination:  General exam: Appears calm and comfortable  Respiratory system: Clear to auscultation. Respiratory effort normal. Cardiovascular system: S1-S2, RRR, no murmurs, no pedal edema Gastrointestinal system: Soft,  NT/ND, normal bowel sounds Central nervous system: Alert and oriented. No focal neurological deficits. Extremities: Symmetric 5 x 5 power. Skin: Wound to lateral dorsal aspect of right foot, serous discharge, tender to touch Psychiatry: Judgement and insight appear normal. Mood & affect  appropriate.     Data Reviewed: I have personally reviewed following labs and imaging studies  CBC: Recent Labs  Lab 01/28/22 1251 01/29/22 0506  WBC 15.7* 10.6*  NEUTROABS 12.6*  --   HGB 14.9 13.4  HCT 43.6 39.8  MCV 88.4 89.0  PLT 311 099   Basic Metabolic Panel: Recent Labs  Lab 01/28/22 1251 01/29/22 0506  NA 136 137  K 3.9 3.4*  CL 98 100  CO2 28 27  GLUCOSE 154* 117*  BUN 15 10  CREATININE 1.06 0.79  CALCIUM 9.9 9.4   GFR: Estimated Creatinine Clearance: 147.4 mL/min (by C-G formula based on SCr of 0.79 mg/dL). Liver Function Tests: Recent Labs  Lab 01/28/22 1251  AST 36  ALT 23  ALKPHOS 82  BILITOT 0.9  PROT 9.5*  ALBUMIN 3.9   No results for input(s): "LIPASE", "AMYLASE" in the last 168 hours. No results for input(s): "AMMONIA" in the last 168 hours. Coagulation Profile: Recent Labs  Lab 01/28/22 1251 01/29/22 0506  INR 1.1 1.1   Cardiac Enzymes: No results for input(s): "CKTOTAL", "CKMB", "CKMBINDEX", "TROPONINI" in the last 168 hours. BNP (last 3 results) No results for input(s): "PROBNP" in the last 8760 hours. HbA1C: No results for input(s): "HGBA1C" in the last 72 hours. CBG: Recent Labs  Lab 01/29/22 0740 01/29/22 1209  GLUCAP 139* 141*   Lipid Profile: No results for input(s): "CHOL", "HDL", "LDLCALC", "TRIG", "CHOLHDL", "LDLDIRECT" in the last 72 hours. Thyroid Function Tests: No results for input(s): "TSH", "T4TOTAL", "FREET4", "T3FREE", "THYROIDAB" in the last 72 hours. Anemia Panel: No results for input(s): "VITAMINB12", "FOLATE", "FERRITIN", "TIBC", "IRON", "RETICCTPCT" in the last 72 hours. Sepsis Labs: Recent Labs  Lab 01/28/22 1251 01/28/22 1840 01/29/22 0506  PROCALCITON  --   --  <0.10  LATICACIDVEN 0.9 1.1  --     Recent Results (from the past 240 hour(s))  Culture, blood (Routine x 2)     Status: None (Preliminary result)   Collection Time: 01/28/22 12:51 PM   Specimen: BLOOD  Result Value Ref Range  Status   Specimen Description BLOOD LEFT ANTECUBITAL  Final   Special Requests   Final    BOTTLES DRAWN AEROBIC AND ANAEROBIC Blood Culture adequate volume   Culture   Final    NO GROWTH < 24 HOURS Performed at [facility], [address], [place], [place] [address]    Report Status PENDING  Incomplete  Culture, blood (Routine x 2)     Status: None (Preliminary result)   Collection Time: 01/28/22  3:37 PM   Specimen: BLOOD  Result Value Ref Range Status   Specimen Description BLOOD BLOOD LEFT HAND  Final   Special Requests   Final    BOTTLES DRAWN AEROBIC AND ANAEROBIC Blood Culture results may not be optimal due to an inadequate volume of blood received in culture bottles   Culture   Final    NO GROWTH < 24 HOURS Performed at [facility], [address], [place], [place] [address]    Report Status PENDING  Incomplete         Radiology Studies: DG Foot Complete Left  Result Date: 01/29/2022 CLINICAL DATA:  Foot pain and swelling. EXAM: LEFT FOOT -  COMPLETE 3+ VIEW COMPARISON:  Diabetic foot ulcer. FINDINGS: There appears to be an open wound on the plantar aspect of the forefoot. Advanced small vessel calcifications consistent with known diabetes. No definite plain film findings for septic arthritis or osteomyelitis. MRI may be helpful for further evaluation. IMPRESSION: 1. Open wound on the plantar aspect of the forefoot. 2. No definite plain film findings for septic arthritis or osteomyelitis. Electronically Signed   By: Marijo Sanes M.D.   On: 01/29/2022 13:49   MR FOOT RIGHT W WO CONTRAST  Result Date: 01/29/2022 CLINICAL DATA:  Diabetic foot ulcer EXAM: MRI OF THE RIGHT FOREFOOT WITHOUT AND WITH CONTRAST TECHNIQUE: Multiplanar, multisequence MR imaging of the right foot was performed before and after the administration of intravenous contrast. CONTRAST:  52m GADAVIST GADOBUTROL 1 MMOL/ML IV SOLN COMPARISON:  Radiographs, same date. FINDINGS: There is an  open wound noted along the lateral aspect of the fifth metatarsal head. Gas is noted in the soft tissues along the dorsum fifth. Elongated rim enhancing fluid collection tracking back along the fifth metatarsal shaft consistent a dissecting abscess. There are also areas of nonenhancing soft tissue worrisome for de vascularized/necrotic tissues. Small fifth MTP joint effusion and signal intensity in the fifth metatarsal head and neck and subsequent enhancement consistent with septic arthritis and osteomyelitis. Diffuse myofasciitis without definite findings for pyomyositis. The other bony structures are intact. IMPRESSION: 1. Open wound along the lateral aspect of the fifth metatarsal head with underlying abscess, probable necrotic soft tissue and diffuse cellulitis and myofasciitis. 2. MR findings consistent with septic arthritis at the fifth MTP joint and associated osteomyelitis involving the fifth metatarsal head and neck. 3. Elongated rim enhancing fluid collection tracking back along the fifth metatarsal shaft consistent with a dissecting abscess. 4. Diffuse myofasciitis without definite findings for pyomyositis. Electronically Signed   By: PMarijo SanesM.D.   On: 01/29/2022 08:17   DG Foot Complete Right  Result Date: 01/28/2022 CLINICAL DATA:  Pain and swelling, open wound in the skin EXAM: RIGHT FOOT COMPLETE - 3+ VIEW COMPARISON:  None Available. FINDINGS: There are multiple pockets of air in the soft tissues adjacent to the fifth metatarsophalangeal joint. In the AP view, faint lucencies are noted in the head of the fifth metatarsal which could not be confirmed in the oblique projection. No recent displaced fracture or dislocation is seen. Extensive arterial calcifications are seen in soft tissues. There is soft tissue swelling over the dorsum of the foot. IMPRESSION: There are pockets of air in the soft tissues adjacent to the right fifth metatarsophalangeal joint suggesting possible infectious  process. Possible small lucencies are seen in the adjacent bony structures. Possibility of osteomyelitis in the head of fifth metatarsal and base of proximal phalanx of right fifth toe is not excluded. If clinically warranted, follow-up MRI or three-phase bone scan may be considered. Electronically Signed   By: PElmer PickerM.D.   On: 01/28/2022 13:30   DG Chest 2 View  Result Date: 01/28/2022 CLINICAL DATA:  Possible sepsis EXAM: CHEST - 2 VIEW COMPARISON:  09/17/2011 FINDINGS: The heart size and mediastinal contours are within normal limits. Both lungs are clear. The visualized skeletal structures are unremarkable. IMPRESSION: No active cardiopulmonary disease. Electronically Signed   By: PElmer PickerM.D.   On: 01/28/2022 13:26        Scheduled Meds:  ALPRAZolam  0.25 mg Oral BID   amLODipine  10 mg Oral Daily   DULoxetine  30 mg Oral Daily  enoxaparin (LOVENOX) injection  55 mg Subcutaneous Q24H   hydrochlorothiazide  12.5 mg Oral Daily   insulin aspart  0-15 Units Subcutaneous TID WC   insulin aspart  0-5 Units Subcutaneous QHS   levETIRAcetam  500 mg Oral BID   linagliptin  5 mg Oral Daily   lisinopril  20 mg Oral Daily   metoprolol succinate  100 mg Oral Daily   pantoprazole  40 mg Oral Daily   Continuous Infusions:  piperacillin-tazobactam (ZOSYN)  IV 3.375 g (01/29/22 0520)   vancomycin 1,250 mg (01/29/22 0518)     LOS: 1 day      Sidney Ace, MD [facility]   If 7PM-7AM, please contact night-coverage  01/29/2022, 1:56 PM

## 2022-01-30 ENCOUNTER — Encounter
Admission: EM | Disposition: A | Discharge status: Home Health | Payer: Self-pay | Source: Home / Self Care | Attending: Internal Medicine

## 2022-01-30 ENCOUNTER — Inpatient Hospital Stay: Payer: Medicaid Other | Admitting: Certified Registered Nurse Anesthetist

## 2022-01-30 ENCOUNTER — Other Ambulatory Visit: Payer: Self-pay

## 2022-01-30 ENCOUNTER — Encounter: Payer: Self-pay | Admitting: Family Medicine

## 2022-01-30 DIAGNOSIS — E440 Moderate protein-calorie malnutrition: Secondary | ICD-10-CM | POA: Insufficient documentation

## 2022-01-30 DIAGNOSIS — L0390 Cellulitis, unspecified: Secondary | ICD-10-CM | POA: Diagnosis not present

## 2022-01-30 DIAGNOSIS — A419 Sepsis, unspecified organism: Secondary | ICD-10-CM | POA: Diagnosis not present

## 2022-01-30 HISTORY — PX: IRRIGATION AND DEBRIDEMENT FOOT: SHX6602

## 2022-01-30 HISTORY — PX: AMPUTATION TOE: SHX6595

## 2022-01-30 LAB — GLUCOSE, CAPILLARY
Glucose-Capillary: 126 mg/dL — ABNORMAL HIGH (ref 70–99)
Glucose-Capillary: 93 mg/dL (ref 70–99)
Glucose-Capillary: 99 mg/dL (ref 70–99)
Glucose-Capillary: 140 mg/dL — ABNORMAL HIGH (ref 70–99)
Glucose-Capillary: 98 mg/dL (ref 70–99)

## 2022-01-30 LAB — SURGICAL PCR SCREEN
MRSA, PCR: NEGATIVE
Staphylococcus aureus: NEGATIVE

## 2022-01-30 LAB — VANCOMYCIN, PEAK: Vancomycin Pk: 23 ug/mL — ABNORMAL LOW (ref 30–40)

## 2022-01-30 SURGERY — IRRIGATION AND DEBRIDEMENT FOOT
Anesthesia: General | Site: Foot | Laterality: Right

## 2022-01-30 MED ORDER — FENTANYL CITRATE (PF) 100 MCG/2ML IJ SOLN
INTRAMUSCULAR | Status: AC
  Filled 2022-01-30: qty 2

## 2022-01-30 MED ORDER — MORPHINE SULFATE (PF) 2 MG/ML IV SOLN
2.0000 mg | INTRAVENOUS | Status: DC | PRN
  Administered 2022-01-30 – 2022-01-31 (×3): 2 mg via INTRAVENOUS
  Filled 2022-01-30 (×3): qty 1

## 2022-01-30 MED ORDER — CHLORHEXIDINE GLUCONATE 4 % EX LIQD: 60.0000 mL | Freq: Once | CUTANEOUS | Status: DC

## 2022-01-30 MED ORDER — FENTANYL CITRATE (PF) 100 MCG/2ML IJ SOLN
INTRAMUSCULAR | Status: DC | PRN
  Administered 2022-01-30: 25 ug via INTRAVENOUS

## 2022-01-30 MED ORDER — PROPOFOL 10 MG/ML IV BOLUS
INTRAVENOUS | Status: AC
Start: 2022-01-30 — End: ?
  Filled 2022-01-30: qty 20

## 2022-01-30 MED ORDER — PROPOFOL 10 MG/ML IV BOLUS
INTRAVENOUS | Status: AC
  Filled 2022-01-30: qty 20

## 2022-01-30 MED ORDER — LACTATED RINGERS IV SOLN: INTRAVENOUS | Status: DC | PRN

## 2022-01-30 MED ORDER — ACETAMINOPHEN 10 MG/ML IV SOLN: 1000.0000 mg | Freq: Once | INTRAVENOUS | Status: DC | PRN

## 2022-01-30 MED ORDER — LIDOCAINE HCL (PF) 1 % IJ SOLN
INTRAMUSCULAR | Status: AC
  Filled 2022-01-30: qty 30

## 2022-01-30 MED ORDER — MIDAZOLAM HCL 2 MG/2ML IJ SOLN
INTRAMUSCULAR | Status: DC | PRN
  Administered 2022-01-30: 2 mg via INTRAVENOUS

## 2022-01-30 MED ORDER — BUPIVACAINE HCL (PF) 0.5 % IJ SOLN
INTRAMUSCULAR | Status: AC
  Filled 2022-01-30: qty 30

## 2022-01-30 MED ORDER — FENTANYL CITRATE (PF) 100 MCG/2ML IJ SOLN: 25.0000 ug | INTRAMUSCULAR | Status: DC | PRN

## 2022-01-30 MED ORDER — OXYCODONE HCL 5 MG PO TABS: 5.0000 mg | ORAL_TABLET | Freq: Once | ORAL | Status: DC | PRN

## 2022-01-30 MED ORDER — ONDANSETRON HCL 4 MG/2ML IJ SOLN
INTRAMUSCULAR | Status: DC | PRN
  Administered 2022-01-30: 4 mg via INTRAVENOUS

## 2022-01-30 MED ORDER — SODIUM CHLORIDE 0.9 % IR SOLN
Status: DC | PRN
  Administered 2022-01-30: 1000 mL

## 2022-01-30 MED ORDER — PROPOFOL 500 MG/50ML IV EMUL
INTRAVENOUS | Status: DC | PRN
  Administered 2022-01-30: 100 ug/kg/min via INTRAVENOUS

## 2022-01-30 MED ORDER — OXYCODONE HCL 5 MG/5ML PO SOLN: 5.0000 mg | Freq: Once | ORAL | Status: DC | PRN

## 2022-01-30 MED ORDER — VANCOMYCIN HCL 1000 MG IV SOLR
INTRAVENOUS | Status: AC
  Filled 2022-01-30: qty 20

## 2022-01-30 MED ORDER — LIDOCAINE-EPINEPHRINE 1 %-1:100000 IJ SOLN
INTRAMUSCULAR | Status: DC | PRN
  Administered 2022-01-30: 20 mL

## 2022-01-30 MED ORDER — LIDOCAINE-EPINEPHRINE 1 %-1:100000 IJ SOLN
INTRAMUSCULAR | Status: AC
  Filled 2022-01-30: qty 1

## 2022-01-30 MED ORDER — 0.9 % SODIUM CHLORIDE (POUR BTL) OPTIME
TOPICAL | Status: DC | PRN
  Administered 2022-01-30: 1000 mL

## 2022-01-30 MED ORDER — OXYCODONE HCL 5 MG PO TABS
5.0000 mg | ORAL_TABLET | ORAL | Status: DC | PRN
  Administered 2022-01-30 – 2022-02-03 (×6): 5 mg via ORAL
  Filled 2022-01-30 (×7): qty 1

## 2022-01-30 MED ORDER — ONDANSETRON HCL 4 MG/2ML IJ SOLN: 4.0000 mg | Freq: Once | INTRAMUSCULAR | Status: DC | PRN

## 2022-01-30 MED ORDER — POVIDONE-IODINE 10 % EX SWAB: 2.0000 | Freq: Once | CUTANEOUS | Status: DC

## 2022-01-30 MED ORDER — MIDAZOLAM HCL 2 MG/2ML IJ SOLN
INTRAMUSCULAR | Status: AC
  Filled 2022-01-30: qty 2

## 2022-01-30 SURGICAL SUPPLY — 48 items
BLADE OSC/SAGITTAL MD 5.5X18 (BLADE) ×1 IMPLANT
BLADE SURG 15 STRL LF DISP TIS (BLADE) IMPLANT
BLADE SURG 15 STRL SS (BLADE) ×2
BNDG COHESIVE 4X5 TAN ST LF (GAUZE/BANDAGES/DRESSINGS) ×2 IMPLANT
BNDG COHESIVE 6X5 TAN ST LF (GAUZE/BANDAGES/DRESSINGS) ×3 IMPLANT
BNDG ELASTIC 4X5.8 VLCR STR LF (GAUZE/BANDAGES/DRESSINGS) ×4 IMPLANT
BNDG ESMARK 4X12 TAN STRL LF (GAUZE/BANDAGES/DRESSINGS) ×3 IMPLANT
BNDG GAUZE DERMACEA FLUFF (GAUZE/BANDAGES/DRESSINGS) ×2
BNDG GAUZE DERMACEA FLUFF 4 (GAUZE/BANDAGES/DRESSINGS) ×2 IMPLANT
BNDG STRETCH GAUZE 3IN X12FT (GAUZE/BANDAGES/DRESSINGS) ×2 IMPLANT
DRSG MEPILEX FLEX 3X3 (GAUZE/BANDAGES/DRESSINGS) IMPLANT
DURAPREP 26ML APPLICATOR (WOUND CARE) ×4 IMPLANT
ELECT REM PT RETURN 9FT ADLT (ELECTROSURGICAL) ×3
ELECTRODE REM PT RTRN 9FT ADLT (ELECTROSURGICAL) ×2 IMPLANT
GAUZE PACKING 1/4 X5 YD (GAUZE/BANDAGES/DRESSINGS) ×2 IMPLANT
GAUZE PACKING IODOFORM 1X5 (PACKING) ×2 IMPLANT
GAUZE SPONGE 4X4 12PLY STRL (GAUZE/BANDAGES/DRESSINGS) ×4 IMPLANT
GAUZE STRETCH 2X75IN STRL (MISCELLANEOUS) ×3 IMPLANT
GAUZE XEROFORM 1X8 LF (GAUZE/BANDAGES/DRESSINGS) ×4 IMPLANT
GLOVE BIO SURGEON STRL SZ7.5 (GLOVE) ×5 IMPLANT
GLOVE SURG UNDER LTX SZ8 (GLOVE) ×3 IMPLANT
GOWN STRL REUS W/ TWL XL LVL3 (GOWN DISPOSABLE) ×4 IMPLANT
GOWN STRL REUS W/TWL MED LVL3 (GOWN DISPOSABLE) ×6 IMPLANT
GOWN STRL REUS W/TWL XL LVL3 (GOWN DISPOSABLE) ×2
HANDPIECE VERSAJET 45 DEG 14 (MISCELLANEOUS) ×1 IMPLANT
HANDPIECE VERSAJET DEBRIDEMENT (MISCELLANEOUS) ×1 IMPLANT
IV NS 1000ML (IV SOLUTION) ×1
IV NS 1000ML BAXH (IV SOLUTION) ×2 IMPLANT
KIT TURNOVER KIT A (KITS) ×3 IMPLANT
LABEL OR SOLS (LABEL) ×3 IMPLANT
MANIFOLD NEPTUNE II (INSTRUMENTS) ×3 IMPLANT
NDL FILTER BLUNT 18X1 1/2 (NEEDLE) ×2 IMPLANT
NDL HYPO 25X1 1.5 SAFETY (NEEDLE) ×2 IMPLANT
NEEDLE FILTER BLUNT 18X 1/2SAF (NEEDLE) ×1
NEEDLE FILTER BLUNT 18X1 1/2 (NEEDLE) ×2 IMPLANT
NEEDLE HYPO 25X1 1.5 SAFETY (NEEDLE) ×3 IMPLANT
NS IRRIG 500ML POUR BTL (IV SOLUTION) ×3 IMPLANT
PACK EXTREMITY ARMC (MISCELLANEOUS) ×3 IMPLANT
PAD ABD DERMACEA PRESS 5X9 (GAUZE/BANDAGES/DRESSINGS) ×4 IMPLANT
PULSAVAC PLUS IRRIG FAN TIP (DISPOSABLE)
SHIELD FULL FACE ANTIFOG 7M (MISCELLANEOUS) ×3 IMPLANT
SUT ETHILON 2 0 FS 18 (SUTURE) ×8 IMPLANT
SUT VIC AB 3-0 SH 27 (SUTURE) ×1
SUT VIC AB 3-0 SH 27X BRD (SUTURE) ×2 IMPLANT
SWAB CULTURE AMIES ANAERIB BLU (MISCELLANEOUS) IMPLANT
SYR 10ML LL (SYRINGE) ×6 IMPLANT
TIP FAN IRRIG PULSAVAC PLUS (DISPOSABLE) ×2 IMPLANT
WATER STERILE IRR 500ML POUR (IV SOLUTION) ×3 IMPLANT

## 2022-01-30 NOTE — Progress Notes (Signed)
PROGRESS NOTE    Daniel Ritter  FBP:[id number] DOB: 04-Nov-1975 DOA: 01/28/2022 PCP: Theotis Burrow, MD    Brief Narrative:   46 y.o. Caucasian male with medical history significant for anxiety, type diabetes mellitus, GERD, hypertension, seizure disorder, peripheral neuropathy and pancreatitis, presented to the emergency room with acute onset of worsening right foot swelling with erythema as well as deteriorating ulcer on the right fifth MTP which has been there for a couple of months.  Patient thought it was initially healing and later on noticed worsening redness and purulent discharge since Wednesday.  He also noticed a black bump per his report.  Since then erythematous been extending to his left foot dorsum.  He has a scab in his left first web space and plantar ulcer on the left foot which has not significantly worsened.  He denies any chest pain or dyspnea or cough.  No nausea or vomiting or abdominal pain.  No fever or chills.  No dysuria, oliguria or hematuria or flank pain.  MRI confirmed septic arthritis with associated osteomyelitis.  Seen by podiatry.  Plan for surgical intervention 7/27.  Assessment & Plan:   Principal Problem:   Sepsis due to cellulitis [facility]) Active Problems:   Acute osteomyelitis of ankle or foot, right (HCC)   Anxiety and depression   Essential hypertension   Seizures (HCC)   Type 2 diabetes mellitus with peripheral neuropathy (HCC)   Malnutrition of moderate degree  Acute osteomyelitis and septic arthritis of right foot Overlying cellulitis Sepsis secondary to above Sepsis physiology criteria met by tachycardia leukocytosis MRI confirms osteomyelitis with associated septic arthritis Wound with minimal drainage, tender to touch Plan: Continue broad-spectrum IV antibiotics Follow blood and wound cultures, NGTD Continue IVF for now OR with podiatry today Multimodal pain control  Type 2 diabetes mellitus with peripheral  neuropathy Hemoglobin A1c 5.7, well controlled Plan: Hold oral metformin Okay for Tradjenta Sliding-scale coverage  Anxiety and depression PTA Xanax and Cymbalta  History of seizure disorder PTA Keppra  Essential hypertension BP controlled Continue PTA regimen of Toprol-XL, lisinopril, amlodipine, hydrochlorothiazide Vital per unit protocol  GERD PPI     DVT prophylaxis: SQ Lovenox Code Status: Full Family Communication: Father at bedside 7/27 Disposition Plan: Status is: Inpatient Remains inpatient appropriate because: Acute osteomyelitis of right foot.  Plan for surgical intervention 7/27   Level of care: Med-Surg  Consultants:  Podiatry  Procedures:  None  Antimicrobials: Vancomycin Zosyn   Subjective: Seen and examined.  Resting comfortably in bed.  No visible distress.  No complaints of pain.  Objective: Vitals:   01/29/22 1204 01/29/22 1621 01/29/22 1957 01/30/22 0522  BP: 104/75 125/88 103/67 108/76  Pulse: 79 80 70 64  Resp: _0 Temp: 98.6 F (37 C) 99.4 F (37.4 C) 98.7 F (37.1 C) 98.5 F (36.9 C)  TempSrc:      SpO2: 97% 99% 95% 96%  Weight:      Height:        Intake/Output Summary (Last 24 hours) at 01/30/2022 1054 Last data filed at 01/30/2022 1000 Gross per 24 hour  Intake 360 ml  Output 1400 ml  Net -1040 ml   Filed Weights   01/28/22 1242  Weight: 108.4 kg    Examination:  General exam: NAD Respiratory system: Clear to auscultation. Respiratory effort normal. Cardiovascular system: S1-S2, RRR, no murmurs, no pedal edema Gastrointestinal system: Soft, NT/ND, normal bowel sounds Central nervous system: Alert and oriented. No focal neurological deficits.  Extremities: Symmetric 5 x 5 power. Skin: Bilateral feet in surgical dressings, not removed Psychiatry: Judgement and insight appear normal. Mood & affect appropriate.     Data Reviewed: I have personally reviewed following labs and imaging  studies  CBC: Recent Labs  Lab 01/28/22 1251 01/29/22 0506  WBC 15.7* 10.6*  NEUTROABS 12.6*  --   HGB 14.9 13.4  HCT 43.6 39.8  MCV 88.4 89.0  PLT 311 165   Basic Metabolic Panel: Recent Labs  Lab 01/28/22 1251 01/29/22 0506  NA 136 137  K 3.9 3.4*  CL 98 100  CO2 28 27  GLUCOSE 154* 117*  BUN 15 10  CREATININE 1.06 0.79  CALCIUM 9.9 9.4   GFR: Estimated Creatinine Clearance: 147.4 mL/min (by C-G formula based on SCr of 0.79 mg/dL). Liver Function Tests: Recent Labs  Lab 01/28/22 1251  AST 36  ALT 23  ALKPHOS 82  BILITOT 0.9  PROT 9.5*  ALBUMIN 3.9   No results for input(s): "LIPASE", "AMYLASE" in the last 168 hours. No results for input(s): "AMMONIA" in the last 168 hours. Coagulation Profile: Recent Labs  Lab 01/28/22 1251 01/29/22 0506  INR 1.1 1.1   Cardiac Enzymes: No results for input(s): "CKTOTAL", "CKMB", "CKMBINDEX", "TROPONINI" in the last 168 hours. BNP (last 3 results) No results for input(s): "PROBNP" in the last 8760 hours. HbA1C: Recent Labs    01/29/22 1034  HGBA1C 5.7*   CBG: Recent Labs  Lab 01/29/22 0740 01/29/22 1209 01/29/22 1621 01/29/22 2231 01/30/22 0844  GLUCAP 139* 141* 83 109* 126*   Lipid Profile: No results for input(s): "CHOL", "HDL", "LDLCALC", "TRIG", "CHOLHDL", "LDLDIRECT" in the last 72 hours. Thyroid Function Tests: No results for input(s): "TSH", "T4TOTAL", "FREET4", "T3FREE", "THYROIDAB" in the last 72 hours. Anemia Panel: No results for input(s): "VITAMINB12", "FOLATE", "FERRITIN", "TIBC", "IRON", "RETICCTPCT" in the last 72 hours. Sepsis Labs: Recent Labs  Lab 01/28/22 1251 01/28/22 1840 01/29/22 0506  PROCALCITON  --   --  <0.10  LATICACIDVEN 0.9 1.1  --     Recent Results (from the past 240 hour(s))  Culture, blood (Routine x 2)     Status: None (Preliminary result)   Collection Time: 01/28/22 12:51 PM   Specimen: BLOOD  Result Value Ref Range Status   Specimen Description BLOOD LEFT  ANTECUBITAL  Final   Special Requests   Final    BOTTLES DRAWN AEROBIC AND ANAEROBIC Blood Culture adequate volume   Culture   Final    NO GROWTH 2 DAYS Performed at [facility], [address] [place], [place] [address]    Report Status PENDING  Incomplete  Culture, blood (Routine x 2)     Status: None (Preliminary result)   Collection Time: 01/28/22  3:37 PM   Specimen: BLOOD  Result Value Ref Range Status   Specimen Description BLOOD BLOOD LEFT HAND  Final   Special Requests   Final    BOTTLES DRAWN AEROBIC AND ANAEROBIC Blood Culture results may not be optimal due to an inadequate volume of blood received in culture bottles   Culture   Final    NO GROWTH 2 DAYS Performed at [facility], [address] [place], [place] [address]    Report Status PENDING  Incomplete  Surgical pcr screen     Status: None   Collection Time: 01/30/22  1:35 AM   Specimen: Nasal Mucosa; Nasal Swab  Result Value Ref Range Status   MRSA, PCR NEGATIVE NEGATIVE Final  Staphylococcus aureus NEGATIVE NEGATIVE Final    Comment: (NOTE) The Xpert SA Assay (FDA approved for NASAL specimens in patients 41 years of age and older), is one component of a comprehensive surveillance program. It is not intended to diagnose infection nor to guide or monitor treatment. Performed at [facility], [address] [place], [place] [address]   Aerobic/Anaerobic Culture w Gram Stain (surgical/deep wound)     Status: None (Preliminary result)   Collection Time: 01/30/22  1:35 AM   Specimen: Foot; Wound  Result Value Ref Range Status   Specimen Description   Final    FOOT RIGTH Performed at [facility], [address], [place], [place] [address]    Special Requests   Final    NONE Performed at [facility], [place], [place] [address]    Gram Stain   Final    NO SQUAMOUS EPITHELIAL CELLS SEEN FEW WBC SEEN MODERATE GRAM POSITIVE  COCCI Performed at [facility], [place] [address] [place], [place] [address]    Culture PENDING  Incomplete   Report Status PENDING  Incomplete         Radiology Studies: US ARTERIAL ABI (SCREENING LOWER EXTREMITY)  Result Date: 01/29/2022 CLINICAL DATA:  Wound/cellulitis EXAM: NONINVASIVE PHYSIOLOGIC VASCULAR STUDY OF BILATERAL LOWER EXTREMITIES TECHNIQUE: Evaluation of both lower extremities were performed at rest, including calculation of ankle-brachial indices with single level Doppler, pressure and pulse volume recording. COMPARISON:  None Available. FINDINGS: Right ABI:  Unable to calculate Left ABI:  1.0 Right Lower Extremity:  Biphasic arterial waveforms. Left Lower Extremity:  Biphasic arterial waveforms. 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 This is typically seen in the setting of medial arteriosclerosis. Signed, Criselda Peaches, MD, [facility] Electronically Signed   By: Jacqulynn Cadet M.D.   On: 01/29/2022 16:16   DG Foot Complete Left  Result Date: 01/29/2022 CLINICAL DATA:  Foot pain and swelling. EXAM: LEFT FOOT - COMPLETE 3+ VIEW COMPARISON:  Diabetic foot ulcer. FINDINGS: There appears to be an open wound on the plantar aspect of the forefoot. Advanced small vessel calcifications consistent with known diabetes. No definite plain film findings for septic arthritis or osteomyelitis. MRI may be helpful for further evaluation. IMPRESSION: 1. Open wound on the plantar aspect of the forefoot. 2. No definite plain film findings for septic arthritis or osteomyelitis. Electronically Signed   By: Marijo Sanes M.D.   On: 01/29/2022 13:49   MR FOOT RIGHT W WO CONTRAST  Result Date: 01/29/2022 CLINICAL DATA:  Diabetic foot  ulcer EXAM: MRI OF THE RIGHT FOREFOOT WITHOUT AND WITH CONTRAST TECHNIQUE: Multiplanar, multisequence MR imaging of the right foot was performed before and after the administration of intravenous contrast. CONTRAST:  75m GADAVIST GADOBUTROL 1 MMOL/ML IV SOLN COMPARISON:  Radiographs, same date. FINDINGS: There is an open wound noted along the lateral aspect of the fifth metatarsal head. Gas is noted in the soft tissues along the dorsum fifth. Elongated rim enhancing fluid collection tracking back along the fifth metatarsal shaft consistent a dissecting abscess. There are also areas of nonenhancing soft tissue worrisome for de vascularized/necrotic tissues. Small fifth MTP joint effusion and signal intensity in the fifth metatarsal head and neck and subsequent enhancement consistent with septic arthritis and osteomyelitis. Diffuse myofasciitis without definite findings  for pyomyositis. The other bony structures are intact. IMPRESSION: 1. Open wound along the lateral aspect of the fifth metatarsal head with underlying abscess, probable necrotic soft tissue and diffuse cellulitis and myofasciitis. 2. MR findings consistent with septic arthritis at the fifth MTP joint and associated osteomyelitis involving the fifth metatarsal head and neck. 3. Elongated rim enhancing fluid collection tracking back along the fifth metatarsal shaft consistent with a dissecting abscess. 4. Diffuse myofasciitis without definite findings for pyomyositis. Electronically Signed   By: Marijo Sanes M.D.   On: 01/29/2022 08:17   DG Foot Complete Right  Result Date: 01/28/2022 CLINICAL DATA:  Pain and swelling, open wound in the skin EXAM: RIGHT FOOT COMPLETE - 3+ VIEW COMPARISON:  None Available. FINDINGS: There are multiple pockets of air in the soft tissues adjacent to the fifth metatarsophalangeal joint. In the AP view, faint lucencies are noted in the head of the fifth metatarsal which could not be confirmed in the oblique projection.  No recent displaced fracture or dislocation is seen. Extensive arterial calcifications are seen in soft tissues. There is soft tissue swelling over the dorsum of the foot. IMPRESSION: There are pockets of air in the soft tissues adjacent to the right fifth metatarsophalangeal joint suggesting possible infectious process. Possible small lucencies are seen in the adjacent bony structures. Possibility of osteomyelitis in the head of fifth metatarsal and base of proximal phalanx of right fifth toe is not excluded. If clinically warranted, follow-up MRI or three-phase bone scan may be considered. Electronically Signed   By: Elmer Picker M.D.   On: 01/28/2022 13:30   DG Chest 2 View  Result Date: 01/28/2022 CLINICAL DATA:  Possible sepsis EXAM: CHEST - 2 VIEW COMPARISON:  09/17/2011 FINDINGS: The heart size and mediastinal contours are within normal limits. Both lungs are clear. The visualized skeletal structures are unremarkable. IMPRESSION: No active cardiopulmonary disease. Electronically Signed   By: Elmer Picker M.D.   On: 01/28/2022 13:26        Scheduled Meds:  ALPRAZolam  0.25 mg Oral BID   amLODipine  10 mg Oral Daily   vitamin C  500 mg Oral BID   chlorhexidine  60 mL Topical Once   DULoxetine  30 mg Oral Daily   enoxaparin (LOVENOX) injection  55 mg Subcutaneous Q24H   hydrochlorothiazide  12.5 mg Oral Daily   insulin aspart  0-15 Units Subcutaneous TID WC   insulin aspart  0-5 Units Subcutaneous QHS   levETIRAcetam  500 mg Oral BID   linagliptin  5 mg Oral Daily   lisinopril  20 mg Oral Daily   metoprolol succinate  100 mg Oral Daily   multivitamin with minerals  1 tablet Oral Daily   nutrition supplement (JUVEN)  1 packet Oral BID BM   pantoprazole  40 mg Oral Daily   povidone-iodine  2 Application Topical Once   Ensure Max Protein  11 oz Oral QHS   zinc sulfate  220 mg Oral Daily   Continuous Infusions:  piperacillin-tazobactam (ZOSYN)  IV 3.375 g (01/30/22 0603)    vancomycin 1,250 mg (01/30/22 0602)     LOS: 2 days      Sidney Ace, MD [facility]   If 7PM-7AM, please contact night-coverage  01/30/2022, 10:54 AM

## 2022-01-30 NOTE — Anesthesia Postprocedure Evaluation (Signed)
Anesthesia Post Note  Patient: Daniel Ritter  Procedure(s) Performed: IRRIGATION AND DEBRIDEMENT TENDON ULCER LEFT FOOT,  EXCISION OF 2ND TOE JOINT LEFT FOOT, IRRIGATION AND DEBRIDEMENT SUBCUTANEOUS ULCER RIGHT FOOT (Bilateral: Foot) PARTIAL 5TH RAY AMUPTATION RIGHT FOOT (Right: Foot)  Patient location during evaluation: PACU Anesthesia Type: General Level of consciousness: awake and alert Pain management: pain level controlled Vital Signs Assessment: post-procedure vital signs reviewed and stable Respiratory status: spontaneous breathing, nonlabored ventilation, respiratory function stable and patient connected to nasal cannula oxygen Cardiovascular status: blood pressure returned to baseline and stable Postop Assessment: no apparent nausea or vomiting Anesthetic complications: no   No notable events documented.   Last Vitals:  Vitals:   01/30/22 1411 01/30/22 1442  BP: 108/80 110/78  Pulse: 64 (!) 59  Resp: 15 16  Temp: (!) 36.1 C (!) 36.3 C  SpO2: 98% 96%    Last Pain:  Vitals:   01/30/22 1411  TempSrc:   PainSc: 0-No pain                 Arita Miss

## 2022-01-30 NOTE — Anesthesia Procedure Notes (Signed)
Date/Time: 01/30/2022 12:43 PM  Performed by: Lily Peer, Janith Nielson, CRNAPre-anesthesia Checklist: Patient identified, Emergency Drugs available, Suction available, Patient being monitored and Timeout performed Patient Re-evaluated:Patient Re-evaluated prior to induction Oxygen Delivery Method: Simple face mask Induction Type: IV induction

## 2022-01-30 NOTE — Transfer of Care (Signed)
Immediate Anesthesia Transfer of Care Note  Patient: Daniel Ritter  Procedure(s) Performed: IRRIGATION AND DEBRIDEMENT TENDON ULCER LEFT FOOT,  EXCISION OF 2ND TOE JOINT LEFT FOOT, IRRIGATION AND DEBRIDEMENT SUBCUTANEOUS ULCER RIGHT FOOT (Bilateral: Foot) PARTIAL 5TH RAY AMUPTATION RIGHT FOOT (Right: Foot)  Patient Location: PACU  Anesthesia Type:General  Level of Consciousness: awake, alert  and oriented  Airway & Oxygen Therapy: Patient Spontanous Breathing  Post-op Assessment: Report given to RN and Post -op Vital signs reviewed and stable  Post vital signs: Reviewed and stable  Last Vitals:  Vitals Value Taken Time  BP 100/68   Temp    Pulse 66 01/30/22 1351  Resp 17 01/30/22 1351  SpO2 98 % 01/30/22 1351  Vitals shown include unvalidated device data.  Last Pain:  Vitals:   01/30/22 1215  TempSrc: Temporal  PainSc: 0-No pain         Complications: No notable events documented.

## 2022-01-30 NOTE — Anesthesia Preprocedure Evaluation (Signed)
Anesthesia Evaluation  Patient identified by MRN, date of birth, ID band Patient awake    Reviewed: Allergy & Precautions, NPO status , Patient's Chart, lab work & pertinent test results  History of Anesthesia Complications Negative for: history of anesthetic complications  Airway Mallampati: II  TM Distance: >3 FB Neck ROM: Full    Dental  (+) Poor Dentition, Chipped, Implants   Pulmonary neg pulmonary ROS, neg sleep apnea, neg COPD, Patient abstained from smoking.Not current smoker, former smoker,    Pulmonary exam normal breath sounds clear to auscultation       Cardiovascular Exercise Tolerance: Good METShypertension, Pt. on medications + Peripheral Vascular Disease  (-) CAD and (-) Past MI (-) dysrhythmias  Rhythm:Regular Rate:Normal - Systolic murmurs    Neuro/Psych Seizures -, Well Controlled,  PSYCHIATRIC DISORDERS Anxiety Depression CVA, No Residual Symptoms    GI/Hepatic GERD  Medicated and Controlled,(+)     substance abuse  marijuana use,   Endo/Other  diabetes, Well Controlled, Type 2  Renal/GU negative Renal ROS     Musculoskeletal   Abdominal   Peds  Hematology   Anesthesia Other Findings Past Medical History: No date: Allergy     Comment:  seasonal No date: Anxiety No date: Diabetes mellitus without complication (HCC) No date: GERD (gastroesophageal reflux disease) No date: H/O ETOH abuse No date: History of rectal abscess 2007: Hypertension No date: Neuropathy No date: Pancreatitis     Comment:  history of, pt states due to alcohol 08/2014: Seizures ([place])     Comment:  " two seizures in the hospital" 08/31/2013: Stroke [facility]) No date: Thrombocytopenia ([place])  Reproductive/Obstetrics                             Anesthesia Physical Anesthesia Plan  ASA: 3  Anesthesia Plan: General   Post-op Pain Management: Minimal or no pain anticipated   Induction:  Intravenous  PONV Risk Score and Plan: 2 and Propofol infusion, TIVA, Ondansetron and Midazolam  Airway Management Planned: Nasal Cannula  Additional Equipment: None  Intra-op Plan:   Post-operative Plan:   Informed Consent: I have reviewed the patients History and Physical, chart, labs and discussed the procedure including the risks, benefits and alternatives for the proposed anesthesia with the patient or authorized representative who has indicated his/her understanding and acceptance.     Dental advisory given  Plan Discussed with: CRNA and Surgeon  Anesthesia Plan Comments: (Discussed risks of anesthesia with patient, including possibility of difficulty with spontaneous ventilation under anesthesia necessitating airway intervention, PONV, and rare risks such as cardiac or respiratory or neurological events, and allergic reactions. Discussed the role of CRNA in patient's perioperative care. Patient understands.)        Anesthesia Quick Evaluation

## 2022-01-30 NOTE — Discharge Instructions (Signed)
Tips For Adding Protein Nutrition Therapy  Patients may be advised to increase the protein in their diet but not necessarily the calories as well. However, note that when adding protein to your diet, you will also be adding extra calories. The following suggestions may help add the extra protein while keeping the calories as low as possible.  Tips Add extra egg to one or more meals  Increase the portion of milk to drink and change to skim milk if able  Include [place] yogurt or cottage cheese for snack or part of a meal  Increase portion size of protein entre and decrease portion of starch/bread  Mix protein powder, nut butter, almond/nut milk, non-fat dry milk, or Greek yogurt to shakes and smoothies  Use these ingredients also in baked goods or other recipes Use double the amount of sandwich filling  Add protein foods to all snacks including cheese, nut butters, milk and yogurt Food Tips for Including Protein  Beans Cook and use dried peas, beans, and tofu in soups or add to casseroles, pastas, and grain dishes that also contain cheese or meat  Mash with cheese and milk  Use tofu to make smoothies  Commercial Protein Supplements Use nutritional supplements or protein powder sold at pharmacies and grocery stores  Use protein powder in milk drinks and desserts, such as pudding  Mix with ice cream, milk, and fruit or other flavorings for a high-protein milkshake  Cottage Cheese or [employer] Mix with or use to stuff fruits and vegetables  Add to casseroles, spaghetti, noodles, or egg dishes such as omelets, scrambled eggs, and souffls  Use gelatin, pudding-type desserts, cheesecake, and pancake or waffle batter  Use to stuff crepes, pasta shells, or manicotti  Puree and use as a substitute for sour cream  Eggs, Egg whites, and Egg Yolks Add chopped, hard-cooked eggs to salads and dressings, vegetables, casseroles, and creamed meats  Beat eggs into mashed potatoes, vegetable purees, and  sauces  Add extra egg whites to quiches, scrambled eggs, custards, puddings, pancake batter, or [place] toast wash/batter  Make a rich custard with egg yolks, double strength milk, and sugar  Add extra hard-cooked yolks to deviled egg filling and sandwich spreads  Hard or Semi-Soft Cheese (Cheddar, Barnabas Lister, [place]) Melt on sandwiches, bread, muffins, tortillas, hamburgers, hot dogs, other meats or fish, vegetables, eggs, or desserts such as stewed figs or pies  Grate and add to soups, sauces, casseroles, vegetable dishes, potatoes, rice noodles, or meatloaf  Serve as a snack with crackers or bagels  Ice cream, Yogurt, and Frozen Yogurt Add to milk drinks such as milkshakes  Add to cereals, fruits, gelatin desserts, and pies  Blend or whip with soft or cooked fruits  Sandwich ice cream or frozen yogurt between enriched cake slices, cookies, or graham crackers  Use seasoned yogurt as a dip for fruits, vegetables, or chips  Use yogurt in place of sour cream in casseroles  Meat and Fish Add chopped, cooked meat or fish to vegetables, salads, casseroles, soups, sauces, and biscuit dough  Use in omelets, souffls, quiches, and sandwich fillings  Add chicken and [place] to stuffing  Wrap in pie crust or biscuit dough as turnovers  Add to stuffed baked potatoes  Add pureed meat to soups  Milk Use in beverages and in cooking  Use in preparing foods, such as hot cereal, soups, cocoa, or pudding  Add cream sauces to vegetable and other dishes  Use evaporated milk, evaporated skim milk, or sweetened condensed  milk instead of milk or water in recipes.  Nonfat Dry Milk Add 1/3 cup of nonfat dry milk powdered milk to each cup of regular milk for "double strength" milk  Add to yogurt and milk drinks, such as pasteurized eggnog and milkshakes  Add to scrambled eggs and mashed potatoes  Use in casseroles, meatloaf, hot cereal, breads, muffins, sauces, cream soups, puddings and custards, and other milk-based  desserts  Nuts, Seeds, and Wheat Germ Add to casseroles, breads, muffins, pancakes, cookies, and waffles  Sprinkle on fruit, cereal, ice cream, yogurt, vegetables, salads, and toast as a crunchy topping  Use in place of breadcrumbs  Blend with parsley or spinach, herbs, and cream for a noodle, pasta, or vegetable sauce.  Roll banana in chopped nuts  Peanut Butter Spread on sandwiches, toast, muffins, crackers, waffles, pancakes, and fruit slices  Use as a dip for raw vegetables, such as carrots, cauliflower, and celery  Blend with milk drinks, smoothies, and other beverages  Swirl through soft ice cream or yogurt  Spread on a banana then roll in crushed, dry cereal or chopped nuts   Copyright 2020  Academy of [employer] and Dietetics. All rights reserved

## 2022-01-30 NOTE — Progress Notes (Signed)
Pharmacy Antibiotic Note  Daniel Ritter is a 46 y.o. male admitted on 01/28/2022 with cellulitis, worsening chronic foot wound on right fifth toe that is now oozing. There is concern for osteomyelitis. Past medical history includes DM, CVA, HTN, and neuropathy. Pharmacy has been consulted for vancomycin dosing.   Plan: Vancomycin 2,500 mg load, then Vancomycin 1250 mg every 12 hours  Goal AUC 400-600 Estimated AUC 541.3, estimated Cmin 14.7 mcg/mL  Also on Zosyn (piperacillin/tazobactam) 3.375 grams every 8 hours (4 hour infusion)  *Will order Vancomycin level around 5th vancomycin dose in anticipation of long term therapy due to osteomyelitis* Cultures still pending.  Height: '6\' 1"'$  (185.4 cm) Weight: 108.4 kg (238 lb 15.7 oz) IBW/kg (Calculated) : 79.9  Temp (24hrs), Avg:98.5 F (36.9 C), Min:97.5 F (36.4 C), Max:99.4 F (37.4 C)  Recent Labs  Lab 01/28/22 1251 01/28/22 1840 01/29/22 0506  WBC 15.7*  --  10.6*  CREATININE 1.06  --  0.79  LATICACIDVEN 0.9 1.1  --      Estimated Creatinine Clearance: 147.4 mL/min (by C-G formula based on SCr of 0.79 mg/dL).    No Known Allergies  Antimicrobials this admission: Vancomycin 7/25 >>  Zosyn 7/25 >>   Dose adjustments this admission: N/a  Microbiology results: 7/25 BCx: NG x 2 days 7/27 Surgical site cx: pending  Thank you for allowing pharmacy to be a part of this patient's care.  Anvi Mangal Rodriguez-Guzman PharmD, BCPS 01/30/2022 1:39 PM

## 2022-01-30 NOTE — Progress Notes (Signed)
[facility]  Daily Progress Note   Subjective  -   Patient about to depart for his lower extremity amputation soon.  Discussed results of ABIs as well as possible follow-up plans.  Objective Vitals:   01/30/22 1411 01/30/22 1442 01/30/22 1659 01/30/22 2006  BP: 108/80 110/78 103/68 95/66  Pulse: 64 (!) 59 64 65  Resp: '15 16 16 15  '$ Temp: (!) 97 F (36.1 C) (!) 97.4 F (36.3 C) 98.1 F (36.7 C) 98.8 F (37.1 C)  TempSrc:      SpO2: 98% 96% 97% 98%  Weight:      Height:        Intake/Output Summary (Last 24 hours) at 01/30/2022 2306 Last data filed at 01/30/2022 1900 Gross per 24 hour  Intake 720 ml  Output 1610 ml  Net -890 ml    PULM  CTAB CV  RRR VASC  bilateral feet wrapped unable to evaluate  Laboratory CBC    Component Value Date/Time   WBC 10.6 (H) 01/29/2022 0506   HGB 13.4 01/29/2022 0506   HGB 13.9 10/30/2011 1123   HCT 39.8 01/29/2022 0506   HCT 37.0 (L) 10/10/2011 1407   PLT 249 01/29/2022 0506   PLT 191 10/30/2011 1123    BMET    Component Value Date/Time   NA 137 01/29/2022 0506   NA 140 11/08/2014 0851   NA 136 09/20/2011 0727   K 3.4 (L) 01/29/2022 0506   K 4.0 10/10/2011 1407   CL 100 01/29/2022 0506   CL 103 09/20/2011 0727   CO2 27 01/29/2022 0506   CO2 21 09/20/2011 0727   GLUCOSE 117 (H) 01/29/2022 0506   GLUCOSE 153 (H) 09/20/2011 0727   BUN 10 01/29/2022 0506   BUN 13 11/08/2014 0851   BUN 11 09/20/2011 0727   CREATININE 0.79 01/29/2022 0506   CREATININE 0.75 10/10/2011 1407   CALCIUM 9.4 01/29/2022 0506   CALCIUM 9.2 09/20/2011 0727   GFRNONAA >60 01/29/2022 0506   GFRNONAA >60 10/10/2011 1407   GFRAA >60 12/12/2015 1540   GFRAA >60 10/10/2011 1407    Assessment/Planning: Discussed case with podiatry as well as patient.  The patient's ABIs show an ABI of 1 on the left and noncompressible on the right.  The patient had noted biphasic waveforms.  Given that the patient is undergoing amputation  tomorrow, there will be first and evaluation of the patient's bleeding capacity.  If the patient has this brisk bleeding we can continue with conservative management and outpatient follow-up.  However, if the patient has reduced bleeding capacity an angiogram would be in his best interest to ensure continued wound healing post amputation.  We will reevaluate postsurgery and discussed with patient following his surgery.  Prior to going to surgery the expected plan was also discussed with the patient so that he is familiar.  We will make the patient n.p.o. in preparation for possible angiogram.  Kris Hartmann  01/30/2022, 11:06 PM

## 2022-01-30 NOTE — Op Note (Signed)
Operative note   Surgeon:Keiera Strathman Lawyer: None    Preop diagnosis: 1.  Osteomyelitis right fifth metatarsal phalangeal joint 2.  Osteomyelitis left second toe joint 3.  Full-thickness ulcer to tendon plantar left foot. 4.  Ulceration full-thickness plantar right fifth metatarsophalangeal joint    Postop diagnosis: Same    Procedure: 1.  Excision second metatarsophalangeal joint left foot 2.  Partial fifth ray amputation right foot 3.  Full-thickness excisional ulcer debridement to tendon plantar left foot 4.  Full-thickness ulcer debridement to subcutaneous tissue plantar right foot    EBL: Less than 20 cc    Anesthesia:local and IV sedation    Hemostasis: Mid calf tourniquet inflated to 200 mmHg for 15 minutes on the left side and 15 minutes on the right side    Specimen: Bone for pathology second MTPJ, bone for pathology right fifth ray and bone for culture right fifth metatarsal    Complications: None    Operative indications:Daniel Ritter is an 46 y.o. that presents today for surgical intervention.  The risks/benefits/alternatives/complications have been discussed and consent has been given.  In the preoperative holding area we had a long discussion regards the plan for surgical invention and I reiterated the discussion that they had with Dr. Luana Shu in regards to the left second metatarsophalangeal joint and right fifth ray needing to be removed.    Procedure:  Patient was brought into the OR and placed on the operating table in thesupine position. After anesthesia was obtained the bilateral lower extremity was prepped and draped in usual sterile fashion.  Attention was initially directed to the second foot.  After inflation of the tourniquet and dorsal incision was made overlying the second MTPJ.  Full-thickness flaps were created down to the metatarsophalangeal joint.  At this time the base of the proximal phalanx and head of the metatarsal were then exposed.  The base  of the proximal phalanx and head of the metatarsal were then excised from the surgical field in toto.  The proximal aspect and proximal margins were inked.  The wound was flushed with copious amounts of irrigation.  The tourniquet was then deflated.  Bleeders were Bovie cauterized at this time.  Mild to moderate bleeding was noted to the area.  The skin was then closed with a 2-0 nylon.  Attention was then directed to the plantar aspect of the left second metatarsal phalangeal joint where a full-thickness ulcer was noted down to the capsular tissue.  At this time full-thickness excisional dissection was performed and debridement was performed with a Versajet tissue nippers and a 15 blade.  This was taken down to good healthy bleeding tissue.  Predebridement measurements were 1.5 x 2.0 cm with postdebridement measurements at 2.5 x 2.0 x 1.0 cm.  All necrotic and fibrotic tissue was removed.  Debridement was carried down to and including the deep fascia and muscle level.  All bleeders were Bovie cauterized.  A bulky sterile drainage was applied to the left foot.  This was then covered.  Inflation of the tourniquet on the right foot was performed and attention was directed to the lateral aspect of the fifth MTPJ.  Large dorsal lateral necrotic tissue was noted.  An incision was made down to the joint.  Bone from the fifth metatarsal head was then excised and sent for pathological examination.  Next 2 full-thickness skin flaps were created from the base of the toe to the base of the fifth metatarsal.  Full-thickness flaps were  created around the metatarsal.  Along the level of the midshaft of the fifth metatarsal an osteotomy was created.  The fifth ray was then removed from the surgical field in toto.  The proximal margin was inked.  At this time the wound was flushed with copious amounts of irrigation.  The skin edges were debrided with a Versajet.  The tourniquet was then released.  All bleeders were Bovie  cauterized.  Only mild bleeding was noted to this area.  The skin was then closed with a 2-0 nylon.  Attention was then directed to the plantar aspect of the right lateral forefoot where a full-thickness ulcer was noted.  Full-thickness excisional debridement was performed with a Versajet.  Predebridement measurements were 1.5 x 1.8 cm.  Postdebridement measurements were 2.0 cm in diameter.  Depth of 1 cm.  Tissue removed was nonviable necrotic and fibrotic tissue.  Debridement was carried down to and including subcutaneous tissue.    Patient tolerated the procedure and anesthesia well.  Was transported from the OR to the PACU with all vital signs stable and vascular status intact. To be discharged per routine protocol.  Will follow up in approximately 1 week in the outpatient clinic.

## 2022-01-31 ENCOUNTER — Encounter: Payer: Self-pay | Admitting: Podiatry

## 2022-01-31 DIAGNOSIS — L089 Local infection of the skin and subcutaneous tissue, unspecified: Secondary | ICD-10-CM

## 2022-01-31 DIAGNOSIS — A419 Sepsis, unspecified organism: Secondary | ICD-10-CM | POA: Diagnosis not present

## 2022-01-31 DIAGNOSIS — Z89421 Acquired absence of other right toe(s): Secondary | ICD-10-CM

## 2022-01-31 DIAGNOSIS — F17210 Nicotine dependence, cigarettes, uncomplicated: Secondary | ICD-10-CM

## 2022-01-31 DIAGNOSIS — L0390 Cellulitis, unspecified: Secondary | ICD-10-CM | POA: Diagnosis not present

## 2022-01-31 DIAGNOSIS — E11628 Type 2 diabetes mellitus with other skin complications: Secondary | ICD-10-CM | POA: Diagnosis not present

## 2022-01-31 DIAGNOSIS — M86171 Other acute osteomyelitis, right ankle and foot: Secondary | ICD-10-CM | POA: Diagnosis not present

## 2022-01-31 DIAGNOSIS — E1169 Type 2 diabetes mellitus with other specified complication: Secondary | ICD-10-CM

## 2022-01-31 DIAGNOSIS — Z794 Long term (current) use of insulin: Secondary | ICD-10-CM

## 2022-01-31 LAB — CREATININE, SERUM
Creatinine, Ser: 0.63 mg/dL (ref 0.61–1.24)
GFR, Estimated: >60 mL/min (ref >60)

## 2022-01-31 LAB — GLUCOSE, CAPILLARY
Glucose-Capillary: 104 mg/dL — ABNORMAL HIGH (ref 70–99)
Glucose-Capillary: 107 mg/dL — ABNORMAL HIGH (ref 70–99)
Glucose-Capillary: 119 mg/dL — ABNORMAL HIGH (ref 70–99)
Glucose-Capillary: 111 mg/dL — ABNORMAL HIGH (ref 70–99)

## 2022-01-31 LAB — VANCOMYCIN, TROUGH: Vancomycin Tr: 11 ug/mL — ABNORMAL LOW (ref 15–20)

## 2022-01-31 MED ORDER — VANCOMYCIN HCL IN DEXTROSE 1-5 GM/200ML-% IV SOLN
1000.0000 mg | Freq: Three times a day (TID) | INTRAVENOUS | Status: AC
  Administered 2022-01-31: 1000 mg via INTRAVENOUS
  Filled 2022-01-31: qty 200

## 2022-01-31 MED ORDER — DULOXETINE HCL 20 MG PO CPEP
20.0000 mg | ORAL_CAPSULE | Freq: Every day | ORAL | Status: DC
  Administered 2022-02-01 – 2022-02-05 (×5): 20 mg via ORAL
  Filled 2022-01-31 (×5): qty 1

## 2022-01-31 MED ORDER — LINEZOLID 600 MG/300ML IV SOLN
600.0000 mg | Freq: Two times a day (BID) | INTRAVENOUS | Status: AC
  Administered 2022-02-01 – 2022-02-04 (×8): 600 mg via INTRAVENOUS
  Filled 2022-01-31 (×8): qty 300

## 2022-01-31 NOTE — Progress Notes (Signed)
Daily Progress Note   Subjective  - 1 Day Post-Op  Follow-up bilateral foot surgery.  He is doing okay at this time.  No pain.  Objective Vitals:   01/30/22 2006 01/31/22 0033 01/31/22 0427 01/31/22 0815  BP: 95/66 105/73 117/83 117/79  Pulse: 65 75 69 68  Resp: '15 15 15 16  '$ Temp: 98.8 F (37.1 C) 98.4 F (36.9 C) 98.3 F (36.8 C) 98.2 F (36.8 C)  TempSrc:      SpO2: 98% 97% 98% 99%  Weight:      Height:        Physical Exam: Right foot incision site looks really good.  No stress to the incision.  No dehiscence.  Good cap fill time to the skin flap.  The plantar ulceration looks stable as well.  Left foot dorsal incision site is well coapted.  The toe has a purple-blue discoloration.  I was able to perform range of motion of the toe and some of this discoloration and diminished.  I suspect he has been toe syndrome post procedure.  We will continue to monitor for now.  Plantar ulceration is stable as well.  No active purulent drainage at this time.  See clinical pictures.         Laboratory CBC    Component Value Date/Time   WBC 10.6 (H) 01/29/2022 0506   HGB 13.4 01/29/2022 0506   HGB 13.9 10/30/2011 1123   HCT 39.8 01/29/2022 0506   HCT 37.0 (L) 10/10/2011 1407   PLT 249 01/29/2022 0506   PLT 191 10/30/2011 1123    BMET    Component Value Date/Time   NA 137 01/29/2022 0506   NA 140 11/08/2014 0851   NA 136 09/20/2011 0727   K 3.4 (L) 01/29/2022 0506   K 4.0 10/10/2011 1407   CL 100 01/29/2022 0506   CL 103 09/20/2011 0727   CO2 27 01/29/2022 0506   CO2 21 09/20/2011 0727   GLUCOSE 117 (H) 01/29/2022 0506   GLUCOSE 153 (H) 09/20/2011 0727   BUN 10 01/29/2022 0506   BUN 13 11/08/2014 0851   BUN 11 09/20/2011 0727   CREATININE 0.63 01/31/2022 0530   CREATININE 0.75 10/10/2011 1407   CALCIUM 9.4 01/29/2022 0506   CALCIUM 9.2 09/20/2011 0727   GFRNONAA >60 01/31/2022 0530   GFRNONAA >60 10/10/2011 1407   GFRAA >60 12/12/2015 1540   GFRAA >60  10/10/2011 1407    Assessment/Planning: Status post right fifth ray amputation for osteomyelitis, excision sec metatarsophalangeal joint for osteomyelitis, and debridement of ulcerations.  Dressings changed today.  Wounds are improved from preoperatively. He does have a bit of blue toe syndrome on the left second toe but with range of motion some of the color comes back to a more natural appearance.  I suspect with time this will continue to improve.  We will have to continue to monitor. Intraoperatively he had fairly good perfusion on the left foot.  He had decreased perfusion on the right foot.  I have asked vascular surgery to evaluate and they are planning for angio in the near future. Infectious disease evaluated today.  Await their recommendations. Wound culture results are pending at this time.  Pathology is pending. He should remain minimally weightbearing.  Should only transfer on his heels.  Probably more pressure on his left heel than his right heel just to keep pressure off the forefoot region on the right foot.  Will recommend a bedside commode. We will continue to follow  at this time.  Daniel Ritter A  01/31/2022, 2:56 PM

## 2022-01-31 NOTE — Progress Notes (Signed)
PROGRESS NOTE    Daniel Ritter  EUM:[id number] DOB: [birth year] DOA: 01/28/2022 PCP: Theotis Burrow, MD    Brief Narrative:   46 y.o. Caucasian male with medical history significant for anxiety, type diabetes mellitus, GERD, hypertension, seizure disorder, peripheral neuropathy and pancreatitis, presented to the emergency room with acute onset of worsening right foot swelling with erythema as well as deteriorating ulcer on the right fifth MTP which has been there for a couple of months.  Patient thought it was initially healing and later on noticed worsening redness and purulent discharge since Wednesday.  He also noticed a black bump per his report.  Since then erythematous been extending to his left foot dorsum.  He has a scab in his left first web space and plantar ulcer on the left foot which has not significantly worsened.  He denies any chest pain or dyspnea or cough.  No nausea or vomiting or abdominal pain.  No fever or chills.  No dysuria, oliguria or hematuria or flank pain.  MRI confirmed septic arthritis with associated osteomyelitis.  Seen by podiatry.  Status post surgical resection 7/27   Assessment & Plan:   Principal Problem:   Sepsis due to cellulitis [facility]) Active Problems:   Acute osteomyelitis of ankle or foot, right (HCC)   Anxiety and depression   Essential hypertension   Seizures (HCC)   Type 2 diabetes mellitus with peripheral neuropathy (HCC)   Malnutrition of moderate degree  Acute osteomyelitis and septic arthritis of right foot Overlying cellulitis Sepsis secondary to above Sepsis physiology criteria met by tachycardia leukocytosis MRI confirms osteomyelitis with associated septic arthritis Wound with minimal drainage, tender to touch Status post surgical resection 7/27, tolerated procedure well Plan: Continue broad-spectrum IV antibiotics Follow blood and wound cultures, NGTD Follow surgical cultures and pathology DC IVF PT and  OT Multimodal pain control  Type 2 diabetes mellitus with peripheral neuropathy Hemoglobin A1c 5.7, well controlled Plan: Hold oral metformin Continue Tradjenta Sliding-scale coverage Carb modified diet  Anxiety and depression PTA Xanax and Cymbalta  History of seizure disorder PTA Keppra  Essential hypertension BP controlled Continue PTA regimen of Toprol-XL, lisinopril, amlodipine, hydrochlorothiazide Vital per unit protocol  GERD PPI     DVT prophylaxis: SQ Lovenox Code Status: Full Family Communication: Father at bedside 7/27 Disposition Plan: Status is: Inpatient Remains inpatient appropriate because: Acute osteomyelitis of right foot.  Postoperative day #1 status post surgical resection.  Pain well controlled  Level of care: Med-Surg  Consultants:  Podiatry  Procedures:  Surgical resection right foot wound with partial ray amputation 7/27  Antimicrobials: Vancomycin Zosyn   Subjective: Seen and examined.  Reports pain is improved from prior to surgery.  No visible distress  Objective: Vitals:   01/30/22 2006 01/31/22 0033 01/31/22 0427 01/31/22 0815  BP: 95/66 105/73 117/83 117/79  Pulse: 65 75 69 68  Resp: 15 15 15 16   Temp: 98.8 F (37.1 C) 98.4 F (36.9 C) 98.3 F (36.8 C) 98.2 F (36.8 C)  TempSrc:      SpO2: 98% 97% 98% 99%  Weight:      Height:        Intake/Output Summary (Last 24 hours) at 01/31/2022 1216 Last data filed at 01/31/2022 0548 Gross per 24 hour  Intake 2089.95 ml  Output 1110 ml  Net 979.95 ml   Filed Weights   01/28/22 1242 01/30/22 1215  Weight: 108.4 kg 108.4 kg    Examination:  General exam: No acute distress Respiratory system:  Clear to auscultation. Respiratory effort normal. Cardiovascular system: S1-S2, RRR, no murmurs, no pedal edema Gastrointestinal system: Soft, NT/ND, normal bowel sounds Central nervous system: Alert and oriented. No focal neurological deficits. Extremities: Symmetric 5 x 5  power. Skin: Right foot in surgical dressing, dressing CDI Psychiatry: Judgement and insight appear normal. Mood & affect appropriate.     Data Reviewed: I have personally reviewed following labs and imaging studies  CBC: Recent Labs  Lab 01/28/22 1251 01/29/22 0506  WBC 15.7* 10.6*  NEUTROABS 12.6*  --   HGB 14.9 13.4  HCT 43.6 39.8  MCV 88.4 89.0  PLT 311 537   Basic Metabolic Panel: Recent Labs  Lab 01/28/22 1251 01/29/22 0506 01/31/22 0530  NA 136 137  --   K 3.9 3.4*  --   CL 98 100  --   CO2 28 27  --   GLUCOSE 154* 117*  --   BUN 15 10  --   CREATININE 1.06 0.79 0.63  CALCIUM 9.9 9.4  --    GFR: Estimated Creatinine Clearance: 147.4 mL/min (by C-G formula based on SCr of 0.63 mg/dL). Liver Function Tests: Recent Labs  Lab 01/28/22 1251  AST 36  ALT 23  ALKPHOS 82  BILITOT 0.9  PROT 9.5*  ALBUMIN 3.9   No results for input(s): "LIPASE", "AMYLASE" in the last 168 hours. No results for input(s): "AMMONIA" in the last 168 hours. Coagulation Profile: Recent Labs  Lab 01/28/22 1251 01/29/22 0506  INR 1.1 1.1   Cardiac Enzymes: No results for input(s): "CKTOTAL", "CKMB", "CKMBINDEX", "TROPONINI" in the last 168 hours. BNP (last 3 results) No results for input(s): "PROBNP" in the last 8760 hours. HbA1C: Recent Labs    01/29/22 1034  HGBA1C 5.7*   CBG: Recent Labs  Lab 01/30/22 1352 01/30/22 1722 01/30/22 2026 01/31/22 0730 01/31/22 1159  GLUCAP 99 140* 98 104* 107*   Lipid Profile: No results for input(s): "CHOL", "HDL", "LDLCALC", "TRIG", "CHOLHDL", "LDLDIRECT" in the last 72 hours. Thyroid Function Tests: No results for input(s): "TSH", "T4TOTAL", "FREET4", "T3FREE", "THYROIDAB" in the last 72 hours. Anemia Panel: No results for input(s): "VITAMINB12", "FOLATE", "FERRITIN", "TIBC", "IRON", "RETICCTPCT" in the last 72 hours. Sepsis Labs: Recent Labs  Lab 01/28/22 1251 01/28/22 1840 01/29/22 0506  PROCALCITON  --   --  <0.10   LATICACIDVEN 0.9 1.1  --     Recent Results (from the past 240 hour(s))  Culture, blood (Routine x 2)     Status: None (Preliminary result)   Collection Time: 01/28/22 12:51 PM   Specimen: BLOOD  Result Value Ref Range Status   Specimen Description BLOOD LEFT ANTECUBITAL  Final   Special Requests   Final    BOTTLES DRAWN AEROBIC AND ANAEROBIC Blood Culture adequate volume   Culture   Final    NO GROWTH 3 DAYS Performed at [facility], [address], [place], [place] [address]    Report Status PENDING  Incomplete  Culture, blood (Routine x 2)     Status: None (Preliminary result)   Collection Time: 01/28/22  3:37 PM   Specimen: BLOOD  Result Value Ref Range Status   Specimen Description BLOOD BLOOD LEFT HAND  Final   Special Requests   Final    BOTTLES DRAWN AEROBIC AND ANAEROBIC Blood Culture results may not be optimal due to an inadequate volume of blood received in culture bottles   Culture   Final    NO GROWTH 3 DAYS Performed at [facility]  Lab, [place], [place] [address]    Report Status PENDING  Incomplete  Surgical pcr screen     Status: None   Collection Time: 01/30/22  1:35 AM   Specimen: Nasal Mucosa; Nasal Swab  Result Value Ref Range Status   MRSA, PCR NEGATIVE NEGATIVE Final   Staphylococcus aureus NEGATIVE NEGATIVE Final    Comment: (NOTE) The Xpert SA Assay (FDA approved for NASAL specimens in patients 33 years of age and older), is one component of a comprehensive surveillance program. It is not intended to diagnose infection nor to guide or monitor treatment. Performed at [facility], [address] [place], [place] [address]   Aerobic/Anaerobic Culture w Gram Stain (surgical/deep wound)     Status: None (Preliminary result)   Collection Time: 01/30/22  1:35 AM   Specimen: Foot; Wound  Result Value Ref Range Status   Specimen Description   Final    FOOT RIGTH Performed at [facility], [address], [place], [place] [address]    Special Requests   Final    NONE Performed at [facility], [place], [place] [address]    Gram Stain   Final    NO SQUAMOUS EPITHELIAL CELLS SEEN FEW WBC SEEN MODERATE GRAM POSITIVE COCCI    Culture   Final    FEW PROTEUS MIRABILIS SUSCEPTIBILITIES TO FOLLOW Performed at [facility], [place] [address] [place], [place] [address]    Report Status PENDING  Incomplete  Aerobic/Anaerobic Culture w Gram Stain (surgical/deep wound)     Status: None (Preliminary result)   Collection Time: 01/30/22  1:19 PM   Specimen: PATH Other; Tissue  Result Value Ref Range Status   Specimen Description   Final    WOUND Performed at [facility] [place], [address], [place], [place] [address]    Special Requests   Final    RIGHT 5TH METATARSAL Performed at [facility], [address] [place], [place] [address]    Gram Stain   Final    FEW WBC PRESENT, PREDOMINANTLY PMN NO ORGANISMS SEEN    Culture   Final    NO GROWTH < 24 HOURS Performed at [place] [address], [place], [place] [address]    Report Status PENDING  Incomplete         Radiology Studies: US ARTERIAL ABI (SCREENING LOWER EXTREMITY)  Result Date: 01/29/2022 CLINICAL DATA:  Wound/cellulitis EXAM: NONINVASIVE PHYSIOLOGIC VASCULAR STUDY OF BILATERAL LOWER EXTREMITIES TECHNIQUE: Evaluation of both lower extremities were performed at rest, including calculation of ankle-brachial indices with single level Doppler, pressure and pulse volume recording. COMPARISON:  None Available. FINDINGS: Right ABI:  Unable to calculate Left ABI:  1.0 Right Lower Extremity:  Biphasic arterial waveforms. Left Lower Extremity:  Biphasic arterial waveforms. 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 This is typically seen in the setting of medial arteriosclerosis. Signed, Criselda Peaches, MD, [facility] Radiology Electronically Signed   By: Jacqulynn Cadet M.D.   On: 01/29/2022 16:16   DG Foot Complete Left  Result Date: 01/29/2022 CLINICAL DATA:  Foot pain and swelling. EXAM: LEFT FOOT - COMPLETE 3+ VIEW COMPARISON:  Diabetic foot ulcer. FINDINGS: There appears to be an open wound on the plantar aspect of the forefoot. Advanced  small vessel calcifications consistent with known diabetes. No definite plain film findings for septic arthritis or osteomyelitis. MRI may be helpful for further evaluation. IMPRESSION: 1. Open wound on the plantar aspect of the forefoot. 2. No definite plain film findings for septic arthritis or osteomyelitis. Electronically Signed   By: Marijo Sanes M.D.   On: 01/29/2022 13:49        Scheduled Meds:  ALPRAZolam  0.25 mg Oral BID   amLODipine  10 mg Oral Daily   vitamin C  500 mg Oral BID   DULoxetine  30 mg Oral Daily   enoxaparin (LOVENOX) injection  55 mg Subcutaneous Q24H   hydrochlorothiazide  12.5 mg Oral Daily   insulin aspart  0-15 Units Subcutaneous TID WC   insulin aspart  0-5 Units Subcutaneous QHS   levETIRAcetam  500 mg Oral BID   linagliptin  5 mg Oral Daily   lisinopril  20 mg Oral Daily   metoprolol succinate  100 mg Oral Daily   multivitamin with minerals  1 tablet Oral Daily   nutrition supplement (JUVEN)  1 packet Oral BID BM   pantoprazole  40 mg Oral Daily   Ensure Max Protein  11 oz Oral QHS   zinc sulfate  220 mg Oral Daily   Continuous Infusions:  piperacillin-tazobactam (ZOSYN)  IV 3.375 g (01/31/22 0508)   vancomycin 1,250 mg (01/31/22 0547)     LOS: 3 days      Sidney Ace, MD [facility]   If 7PM-7AM, please contact night-coverage  01/31/2022, 12:16 PM

## 2022-01-31 NOTE — Progress Notes (Signed)
Pharmacy Antibiotic Note  Daniel Ritter is a 46 y.o. male admitted on 01/28/2022 with cellulitis, worsening chronic foot wound on right fifth toe that is now oozing. There is concern for osteomyelitis. Past medical history includes DM, CVA, HTN, and neuropathy. Pharmacy has been consulted for vancomycin dosing.  Calculated AUC 355.4 and Cmin 18.3.   Plan: Increase vancomycin 1,000 mg every 8 hours  Goal AUC 400-600 Estimated AUC 430 , estimated Cmin 20 mcg/mL  Also on Zosyn (piperacillin/tazobactam) 3.375 grams every 8 hours (4 hour infusion)  *Will order Vancomycin level around 5th vancomycin dose in anticipation of long term therapy due to osteomyelitis* Cultures still pending.  Height: '6\' 1"'$  (185.4 cm) Weight: 108.4 kg (238 lb 15.7 oz) IBW/kg (Calculated) : 79.9  Temp (24hrs), Avg:98.4 F (36.9 C), Min:98.1 F (36.7 C), Max:98.8 F (37.1 C)  Recent Labs  Lab 01/28/22 1251 01/28/22 1840 01/29/22 0506 01/30/22 2129 01/31/22 0530  WBC 15.7*  --  10.6*  --   --   CREATININE 1.06  --  0.79  --  0.63  LATICACIDVEN 0.9 1.1  --   --   --   VANCOTROUGH  --   --   --   --  11*  VANCOPEAK  --   --   --  23*  --      Estimated Creatinine Clearance: 147.4 mL/min (by C-G formula based on SCr of 0.63 mg/dL).    No Known Allergies  Antimicrobials this admission: Vancomycin 7/25 >>  Zosyn 7/25 >>   Dose adjustments this admission: N/a  Microbiology results: 7/25 BCx: NG x 2 days 7/27 Surgical site cx: pending  Thank you for allowing pharmacy to be a part of this patient's care.  Glean Salvo, PharmD Clinical Pharmacist  01/31/2022 2:58 PM

## 2022-01-31 NOTE — Evaluation (Signed)
Physical Therapy Evaluation Patient Details Name: Daniel Ritter MRN: [id number] DOB: 13-Feb-1976 Today's Date: 01/31/2022  History of Present Illness  Pt is a 46 y.o. male presenting to hospital 7/25 with foot wounds.  Pt admitted with sepsis d/t cellulitis and acute osteomyelitis of R ankle/foot.  S/p 7/27: Excision second metatarsophalangeal joint left foot; Partial fifth ray amputation right foot; Full-thickness excisional ulcer debridement to tendon plantar left foot; Full-thickness ulcer debridement to subcutaneous tissue plantar right foot.  PMH includes DM, CVA, htn, neuropathy, anxiety, seizures, cytotoxic brain edema, ICH, and ACL repair L.  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  Pt reports his brother works but is planning on staying with them to help.  Upon PT arrival to pt's room, pt reports he had just returned from walking to the bathroom (wearing his B post op shoes).  Discussed with pt regarding order for heel [place] for transfers only (therapist limited activity to transfers only during session)--pt reports he thought he could walk if he had B post op shoes on.  Therapist clarified activity restrictions with MD Vickki Muff who reported pt should only be performing transfers (maintaining [place] precautions)--pt updated on this by MD Vickki Muff and by therapist.  During session pt was modified independent semi-supine to sitting edge of bed and CGA with squat pivot transfer bed to recliner (able to maintain [place] precautions with cueing).  Pt would benefit from skilled PT to address noted impairments and functional limitations during hospitalization (see below for any additional details).    Recommendations for follow up therapy are one component of a multi-disciplinary discharge planning process, led by the attending physician.  Recommendations may be updated  based on patient status, additional functional criteria and insurance authorization.  Follow Up Recommendations Follow physician's recommendations for discharge plan and follow up therapies      Assistance Recommended at Discharge Set up Supervision/Assistance  Patient can return home with the following  A little help with walking and/or transfers;A little help with bathing/dressing/bathroom;Assistance with cooking/housework;Assist for transportation;Help with stairs or ramp for entrance    Equipment Recommendations BSC/3in1;Wheelchair (measurements PT);Wheelchair cushion (measurements PT)  Recommendations for Other Services       Functional Status Assessment Patient has had a recent decline in their functional status and demonstrates the ability to make significant improvements in function in a reasonable and predictable amount of time.     Precautions / Restrictions Precautions Precautions: Fall Precaution Comments: B post op shoes Restrictions Weight Bearing Restrictions: Yes Other Position/Activity Restrictions: Heel contact and surgical shoes B LE's; Heel WB for transfers only (pt can WB slightly more on L than R foot)      Mobility  Bed Mobility Overal bed mobility: Modified Independent             General bed mobility comments: Supine to sitting edge of bed--no difficulties noted    Transfers Overall transfer level: Needs assistance Equipment used: None Transfers: Bed to chair/wheelchair/BSC       Squat pivot transfers: Min guard     General transfer comment: squat pivot bed to recliner towards R side; vc's for WB'ing precautions and overall technique    Ambulation/Gait               General Gait Details: Not appropriate at this time--transfers only per orders and MD Odelia Gage  Wheelchair Mobility    Modified Rankin (Stroke Patients Only)       Balance Overall balance assessment: Needs assistance Sitting-balance  support: No upper extremity supported, Feet supported (B heel WB'ing) Sitting balance-Leahy Scale: Good Sitting balance - Comments: steady sitting reaching within BOS                                     Pertinent Vitals/Pain Pain Assessment Pain Assessment: Faces Pain Score: 7  Pain Location: B feet Pain Descriptors / Indicators: Aching Pain Intervention(s): Limited activity within patient's tolerance, Monitored during session, Repositioned, RN gave pain meds during session    Holly expects to be discharged to:: Private residence Living Arrangements: Parent (pt's dad) Available Help at Discharge: Family (pt's brother) Type of Home: House Home Access: Ramped entrance       Home Layout: One level Home Equipment: Rollator (4 wheels);Wheelchair - manual;Grab bars - tub/shower;Shower seat;Shower seat - built in;Hand held shower head;Grab bars - toilet;Hospital bed (grab bars throughout home; can use his dad's hospital bed)      Prior Function Prior Level of Function : Independent/Modified Independent             Mobility Comments: 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 (pt's brother works during the day but plans to stay with to assist; has additional family that can assist)       Hand Dominance        Extremity/Trunk Assessment   Upper Extremity Assessment Upper Extremity Assessment: Overall WFL for tasks assessed    Lower Extremity Assessment Lower Extremity Assessment: Generalized weakness    Cervical / Trunk Assessment Cervical / Trunk Assessment: Normal  Communication   Communication: No difficulties  Cognition Arousal/Alertness: Awake/alert Behavior During Therapy: WFL for tasks assessed/performed Overall Cognitive Status: Within Functional Limits for tasks assessed                                          General Comments General comments (skin integrity, edema, etc.): B feet dressings in place.  B post op  shoes appearing too big/long for pt--nurse reports plan to get pt a smaller size B.    Exercises  Transfer training   Assessment/Plan    PT Assessment Patient needs continued PT services  PT Problem List Decreased strength;Decreased balance;Decreased mobility;Decreased knowledge of use of DME;Decreased knowledge of precautions;Pain;Decreased skin integrity       PT Treatment Interventions DME instruction;Functional mobility training;Therapeutic activities;Therapeutic exercise;Balance training;Patient/family education;Wheelchair mobility training    PT Goals (Current goals can be found in the Care Plan section)  Acute Rehab PT Goals Patient Stated Goal: to go home and improve wounds PT Goal Formulation: With patient Time For Goal Achievement: 02/14/22 Potential to Achieve Goals: Good    Frequency 7X/week     Co-evaluation               AM-PAC PT "6 Clicks" Mobility  Outcome Measure Help needed turning from your back to your side while in a flat bed without using bedrails?: None Help needed moving from lying on your back to sitting on the side of a flat bed without using bedrails?: None Help needed moving to and from a bed to a chair (including a wheelchair)?: A Little Help needed standing up from a chair using your  arms (e.g., wheelchair or bedside chair)?: A Little Help needed to walk in hospital room?: Total Help needed climbing 3-5 steps with a railing? : Total 6 Click Score: 16    End of Session Equipment Utilized During Treatment: Other (comment) (B post op shoes) Activity Tolerance: Patient tolerated treatment well Patient left: in chair;with call bell/phone within reach;with chair alarm set;with nursing/sitter in room;Other (comment) (B LE's elevated on pillows) Nurse Communication: Mobility status;Precautions;Weight bearing status PT Visit Diagnosis: Other abnormalities of gait and mobility (R26.89);Muscle weakness (generalized) (M62.81);Pain Pain - Right/Left:  Right (Left) Pain - part of body: Ankle and joints of foot    Time: 7622-6333 PT Time Calculation (min) (ACUTE ONLY): 30 min   Charges:   PT Evaluation $PT Eval Low Complexity: 1 Low PT Treatments $Therapeutic Activity: 8-22 mins       Leitha Bleak, PT 01/31/22, 1:30 PM

## 2022-01-31 NOTE — Plan of Care (Signed)
  Problem: Fluid Volume: Goal: Hemodynamic stability will improve Outcome: Progressing   Problem: Clinical Measurements: Goal: Diagnostic test results will improve Outcome: Progressing Goal: Signs and symptoms of infection will decrease Outcome: Progressing   Problem: Education: Goal: Knowledge of General Education information will improve Description: Including pain rating scale, medication(s)/side effects and non-pharmacologic comfort measures Outcome: Progressing   Problem: Health Behavior/Discharge Planning: Goal: Ability to manage health-related needs will improve Outcome: Progressing   Problem: Clinical Measurements: Goal: Diagnostic test results will improve Outcome: Progressing Goal: Respiratory complications will improve Outcome: Progressing Goal: Cardiovascular complication will be avoided Outcome: Progressing   Problem: Activity: Goal: Risk for activity intolerance will decrease Outcome: Progressing   Problem: Nutrition: Goal: Adequate nutrition will be maintained Outcome: Progressing

## 2022-01-31 NOTE — H&P (View-Only) (Signed)
[facility]  Daily Progress Note   Subjective  -   Today the patient is resting comfortably eating this morning.  He is currently denies any significant pain postsurgery on his bilateral feet from yesterday.  Objective Vitals:   01/30/22 2006 01/31/22 0033 01/31/22 0427 01/31/22 0815  BP: 95/66 105/73 117/83 117/79  Pulse: 65 75 69 68  Resp: '15 15 15 16  '$ Temp: 98.8 F (37.1 C) 98.4 F (36.9 C) 98.3 F (36.8 C) 98.2 F (36.8 C)  TempSrc:      SpO2: 98% 97% 98% 99%  Weight:      Height:        Intake/Output Summary (Last 24 hours) at 01/31/2022 1344 Last data filed at 01/31/2022 0548 Gross per 24 hour  Intake 1589.95 ml  Output 1110 ml  Net 479.95 ml    PULM  CTAB CV  RRR VASC  unable to assess pedal pulses due to postoperative dressings bilaterally.  Laboratory CBC    Component Value Date/Time   WBC 10.6 (H) 01/29/2022 0506   HGB 13.4 01/29/2022 0506   HGB 13.9 10/30/2011 1123   HCT 39.8 01/29/2022 0506   HCT 37.0 (L) 10/10/2011 1407   PLT 249 01/29/2022 0506   PLT 191 10/30/2011 1123    BMET    Component Value Date/Time   NA 137 01/29/2022 0506   NA 140 11/08/2014 0851   NA 136 09/20/2011 0727   K 3.4 (L) 01/29/2022 0506   K 4.0 10/10/2011 1407   CL 100 01/29/2022 0506   CL 103 09/20/2011 0727   CO2 27 01/29/2022 0506   CO2 21 09/20/2011 0727   GLUCOSE 117 (H) 01/29/2022 0506   GLUCOSE 153 (H) 09/20/2011 0727   BUN 10 01/29/2022 0506   BUN 13 11/08/2014 0851   BUN 11 09/20/2011 0727   CREATININE 0.63 01/31/2022 0530   CREATININE 0.75 10/10/2011 1407   CALCIUM 9.4 01/29/2022 0506   CALCIUM 9.2 09/20/2011 0727   GFRNONAA >60 01/31/2022 0530   GFRNONAA >60 10/10/2011 1407   GFRAA >60 12/12/2015 1540   GFRAA >60 10/10/2011 1407    Assessment/Planning: As noted in the initial x-ray the patient had significant tibial vessel calcification.  This is also correlated with the ABI being noncompressible in the right foot.   Additionally, as noted by podiatry, the patient had very brisk bleeding in his left foot however the right was rather diminished.  Based on this we recommend the patient undergo a right lower extremity angiogram.  Due to the patient eating this morning we are unable to proceed with intervention.  The patient has other obligations that must be attended to next week, therefore we will have patient return on an outpatient basis for right lower extremity angiogram.  I have discussed the risk, benefits and alternatives.  I discussed the procedure itself.  The patient is agreeable to proceed.  Kris Hartmann  01/31/2022, 1:44 PM

## 2022-01-31 NOTE — Consult Note (Signed)
NAME: Daniel Ritter  DOB: 1976/05/06  MRN: [id number]  Date/Time: 01/31/2022 10:56 AM  REQUESTING PROVIDER: Dr.Fowler Subjective:  REASON FOR CONSULT: diabetic foot infection b/l ? RAD GRAMLING is a 46 y.o. male with a history of DM, CVA presents with b/l feet infection Pt says he has has had wound on the trt 5th toe area for amny months Recently it got worse with increased swelling and discharge and redness He also noted swelling of the left foot on the dorsum , but never checked the bottom of the foot for any wound He noted a few days ago the left 2 nd toe was red, swollen and looking bad He came to the ED on 01/28/22 BP 135/82, temp 98.5, pulse 101 and sats 97% Wbc 15.7, HB 14.9, PLT 311 and cr 1.06 Blood culture sent and he was started on vanco and zosyn fo infection Xray of the rt foot showed pockets of air in the soft tissues adjacent to the right fifth metatarsophalangeal joint suggesting possible infectious process. Possible small lucencies are seen in the adjacent bony structures. Possibility of osteomyelitis in the head of fifth metatarsal and base of proximal phalanx of right fifth toe  He was seen by podiatrist and was taken for suregry yesterday and underwent Excision second metatarsophalangeal joint left foot 2.  Partial fifth ray amputation right foot 3.  Full-thickness excisional ulcer debridement to tendon plantar left foot 4.  Full-thickness ulcer debridement to subcutaneous tissue plantar right foot  I am asked to see the patient for management of the infection  Pt lives with his father an care taker for him Pt lost 100 pounds since 2020 by diet and exercise He quit smoking and alcohol since 2 years In the past he has had seizures due to withdrawal, pancreatitis due to alcohol He says diabetes better controlled- las Hba1c is less than 5- off insulin - takes metformin and jardiance He [facility]  HE walks bare foot in his driveway He has  neuropathy  Past Medical History:  Diagnosis Date   Allergy    seasonal   Anxiety    Diabetes mellitus without complication ([place])    GERD (gastroesophageal reflux disease)    H/O ETOH abuse    History of rectal abscess    Hypertension 2007   Neuropathy    Pancreatitis    history of, pt states due to alcohol   Seizures ([place]) 08/2014   " two seizures in the hospital"   Stroke [facility]) 08/31/2013   Thrombocytopenia ([place])     Past Surgical History:  Procedure Laterality Date   AMPUTATION TOE Right 01/30/2022   Procedure: PARTIAL 5TH RAY AMUPTATION RIGHT FOOT;  Surgeon: Samara Deist, DPM;  Location: [facility] ORS;  Service: Podiatry;  Laterality: Right;   ANTERIOR CRUCIATE LIGAMENT REPAIR Left 1992, 1993   COLONOSCOPY N/A 09/04/2021   Procedure: COLONOSCOPY;  Surgeon: Toledo, Benay Pike, MD;  Location: [facility] ENDOSCOPY;  Service: Gastroenterology;  Laterality: N/A;  DM   INCISION AND DRAINAGE PERIRECTAL ABSCESS N/A 12/14/2015   Procedure: IRRIGATION AND DEBRIDEMENT PERIRECTAL ABSCESS;  Surgeon: Christene Lye, MD;  Location: [facility] ORS;  Service: General;  Laterality: N/A;   IRRIGATION AND DEBRIDEMENT FOOT Bilateral 01/30/2022   Procedure: IRRIGATION AND DEBRIDEMENT TENDON ULCER LEFT FOOT,  EXCISION OF 2ND TOE JOINT LEFT FOOT, IRRIGATION AND DEBRIDEMENT SUBCUTANEOUS ULCER RIGHT FOOT;  Surgeon: Samara Deist, DPM;  Location: [facility] ORS;  Service: Podiatry;  Laterality: Bilateral;    Social History  Socioeconomic History   Marital status: Divorced    Spouse name: Not on file   Number of children: 0   Years of education: 14   Highest education level: Not on file  Occupational History   Occupation: Buyer, retail: [employer]   Occupation: diabled  Tobacco Use   Smoking status: Former    Packs/day: 0.25    Years: 18.00    Total pack years: 4.50    Types: Cigarettes    Quit date: 04/11/2020    Years since quitting: 1.8   Smokeless tobacco: Former    Types: Snuff     Quit date: 2001   Tobacco comments:    patient quit for 3 years and then started back . 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 Current Facility-Administered Medications  Medication Dose Route Frequency Provider Last Rate Last Admin   acetaminophen (TYLENOL) tablet 650 mg  650 mg Oral Q6H PRN Samara Deist, DPM   650 mg at 01/29/22 1846   Or   acetaminophen (TYLENOL) suppository 650 mg  650 mg Rectal Q6H PRN Samara Deist, DPM       ALPRAZolam Duanne Moron) tablet 0.25 mg  0.25 mg Oral BID Samara Deist, DPM   0.25 mg at 01/31/22 0917   amLODipine (NORVASC) tablet 10 mg  10 mg Oral Daily Samara Deist, DPM   10 mg at 01/31/22 1610   ascorbic acid (VITAMIN C) tablet 500 mg  500 mg Oral BID Samara Deist, DPM   500 mg at 01/31/22 9604   DULoxetine (CYMBALTA) DR capsule 30 mg  30 mg Oral Daily Samara Deist, DPM   30 mg at 01/31/22 0917   enoxaparin (LOVENOX) injection 55 mg  55 mg Subcutaneous Q24H Samara Deist, DPM   55 mg at 01/30/22 2133   hydrochlorothiazide (HYDRODIURIL) tablet 12.5 mg  12.5 mg Oral Daily Samara Deist, DPM   12.5 mg at 01/31/22 0916   insulin aspart (novoLOG) injection 0-15 Units  0-15 Units Subcutaneous TID WC Samara Deist, DPM   2 Units at 01/30/22 1733   insulin aspart (novoLOG) injection 0-5 Units  0-5 Units Subcutaneous QHS Samara Deist, DPM       levETIRAcetam (KEPPRA) tablet 500 mg  500 mg Oral BID Samara Deist, DPM  500 mg at 01/31/22 0916   linagliptin (TRADJENTA) tablet 5 mg  5 mg Oral Daily Samara Deist, DPM   5 mg at 01/31/22 0918   lisinopril (ZESTRIL) tablet 20 mg  20 mg Oral Daily Samara Deist, DPM   20 mg at 01/31/22 0539   magnesium hydroxide (MILK OF MAGNESIA) suspension 30 mL  30 mL Oral Daily PRN Samara Deist, DPM       metoprolol succinate (TOPROL-XL) 24 hr tablet 100 mg  100 mg Oral Daily Samara Deist, DPM   100 mg at 01/31/22 7673   morphine (PF) 2 MG/ML injection 2 mg  2 mg Intravenous Q3H PRN Samara Deist, DPM   2 mg at 01/31/22 4193   multivitamin with minerals tablet 1 tablet  1 tablet Oral Daily Samara Deist, DPM   1 tablet at 01/31/22 7902   nutrition supplement (JUVEN) (JUVEN) powder packet 1 packet  1 packet Oral BID BM Samara Deist, DPM   1 packet at 01/31/22 0911   ondansetron (ZOFRAN) tablet 4 mg  4 mg Oral Q6H PRN Samara Deist, DPM       Or   ondansetron [facility]) injection 4 mg  4 mg Intravenous Q6H PRN Samara Deist, DPM       oxyCODONE (Oxy IR/ROXICODONE) immediate release tablet 5 mg  5 mg Oral Q4H PRN Samara Deist, DPM   5 mg at 01/31/22 0544   pantoprazole (PROTONIX) EC tablet 40 mg  40 mg Oral Daily Samara Deist, DPM   40 mg at 01/31/22 0916   piperacillin-tazobactam (ZOSYN) IVPB 3.375 g  3.375 g Intravenous Q8H Samara Deist, DPM 12.5 mL/hr at 01/31/22 0508 3.375 g at 01/31/22 0508   protein supplement (ENSURE MAX) liquid  11 oz Oral QHS Samara Deist, DPM   11 oz at 01/30/22 2141   traZODone (DESYREL)  tablet 25 mg  25 mg Oral QHS PRN Samara Deist, DPM       vancomycin (VANCOREADY) IVPB 1250 mg/250 mL  1,250 mg Intravenous Q12H Samara Deist, DPM 166.7 mL/hr at 01/31/22 0547 1,250 mg at 01/31/22 0547   zinc sulfate capsule 220 mg  220 mg Oral Daily Samara Deist, DPM   220 mg at 01/31/22 4097     Abtx:  Anti-infectives (From admission, onward)    Start     Dose/Rate Route Frequency Ordered Stop   01/29/22 0600  vancomycin (VANCOREADY) IVPB 1250 mg/250 mL        1,250 mg 166.7 mL/hr over 90 Minutes Intravenous Every 12 hours 01/28/22 1816     01/28/22 1930  vancomycin (VANCOCIN) 250 mg in sodium chloride 0.9 % 500 mL IVPB  Status:  Discontinued        250 mg 251.3 mL/hr over 120 Minutes Intravenous  Once 01/28/22 1717 01/28/22 1725   01/28/22 1930  vancomycin (VANCOREADY) IVPB 500 mg/100 mL        500 mg 100 mL/hr over 60 Minutes Intravenous  Once 01/28/22 1725 01/28/22 2053   01/28/22 1800  piperacillin-tazobactam (ZOSYN) IVPB 3.375 g  Status:  Discontinued        3.375 g 100 mL/hr over 30 Minutes Intravenous Every 6 hours 01/28/22 1651 01/28/22 1723   01/28/22 1730  vancomycin (VANCOREADY) IVPB 2000 mg/400 mL        2,000 mg 200 mL/hr over 120 Minutes Intravenous  Once 01/28/22 1717 01/28/22 1944   01/28/22 1730  piperacillin-tazobactam (ZOSYN) IVPB 3.375 g        3.375 g 12.5 mL/hr over 240  Minutes Intravenous Every 8 hours 01/28/22 1724     01/28/22 1700  vancomycin (VANCOCIN) IVPB 1000 mg/200 mL premix  Status:  Discontinued        1,000 mg 200 mL/hr over 60 Minutes Intravenous  Once 01/28/22 1651 01/28/22 1659   01/28/22 1530  vancomycin (VANCOREADY) IVPB 2000 mg/400 mL  Status:  Discontinued        2,000 mg 200 mL/hr over 120 Minutes Intravenous  Once 01/28/22 1523 01/28/22 1717   01/28/22 1530  piperacillin-tazobactam (ZOSYN) IVPB 3.375 g  Status:  Discontinued        3.375 g 100 mL/hr over 30 Minutes Intravenous  Once 01/28/22 1523 01/28/22 1723       REVIEW OF  SYSTEMS:  Const: negative fever, negative chills, intentional weight  weight loss Eyes: negative diplopia or visual changes, negative eye pain ENT: negative coryza, negative sore throat Resp: negative cough, hemoptysis, dyspnea Cards: negative for chest pain, palpitations, lower extremity edema GU: negative for frequency, dysuria and hematuria GI: Negative for abdominal pain, diarrhea, bleeding, constipation Skin: negative for rash and pruritus Heme: negative for easy bruising and gum/nose bleeding MS: as above Neurolo:negative for headaches, dizziness, vertigo, memory problems  Psych: negative for feelings of anxiety, depression  Endocrine: , diabetes Allergy/Immunology- negative for any medication or food allergies ?  Objective:  VITALS:  BP 117/79 (BP Location: Right Arm)   Pulse 68   Temp 98.2 F (36.8 C)   Resp 16   Ht '6\' 1"'$  (1.854 m)   Wt 108.4 kg   SpO2 99%   BMI 31.53 kg/m   PHYSICAL EXAM:  General: Alert, cooperative, no distress, appears stated age.  Head: Normocephalic, without obvious abnormality, atraumatic. Eyes: Conjunctivae clear, anicteric sclerae. Pupils are equal ENT Nares normal. No drainage or sinus tenderness. Lips, mucosa, and tongue normal. No Thrush Neck: Supple, symmetrical, no adenopathy, thyroid: non tender no carotid bruit and no JVD. Back: No CVA tenderness. Lungs: Clear to auscultation bilaterally. No Wheezing or Rhonchi. No rales. Heart: Regular rate and rhythm, no murmur, rub or gallop. Abdomen: Soft, non-tender,not distended. Bowel sounds normal. No masses Extremities:both legs surgical dressing         Post surgery       Skin: No rashes or lesions. Or bruising Lymph: Cervical, supraclavicular normal. Neurologic: Grossly non-focal Pertinent Labs Lab Results    Latest Ref Rng & Units 01/29/2022    5:06 AM 01/28/2022   12:51 PM 07/29/2021    1:42 PM  CBC  WBC 4.0 - 10.5 K/uL 10.6  15.7  7.3   Hemoglobin 13.0 - 17.0  g/dL 13.4  14.9  14.9   Hematocrit 39.0 - 52.0 % 39.8  43.6  41.7   Platelets 150 - 400 K/uL 249  311  113        Latest Ref Rng & Units 01/31/2022    5:30 AM 01/29/2022    5:06 AM 01/28/2022   12:51 PM  CMP  Glucose 70 - 99 mg/dL  117  154   BUN 6 - 20 mg/dL  10  15   Creatinine 0.61 - 1.24 mg/dL 0.63  0.79  1.06   Sodium 135 - 145 mmol/L  137  136   Potassium 3.5 - 5.1 mmol/L  3.4  3.9   Chloride 98 - 111 mmol/L  100  98   CO2 22 - 32 mmol/L  27  28   Calcium 8.9 - 10.3 mg/dL  9.4  9.9   Total  Protein 6.5 - 8.1 g/dL   9.5   Total Bilirubin 0.3 - 1.2 mg/dL   0.9   Alkaline Phos 38 - 126 U/L   82   AST 15 - 41 U/L   36   ALT 0 - 44 U/L   23       Microbiology: Recent Results (from the past 240 hour(s))  Culture, blood (Routine x 2)     Status: None (Preliminary result)   Collection Time: 01/28/22 12:51 PM   Specimen: BLOOD  Result Value Ref Range Status   Specimen Description BLOOD LEFT ANTECUBITAL  Final   Special Requests   Final    BOTTLES DRAWN AEROBIC AND ANAEROBIC Blood Culture adequate volume   Culture   Final    NO GROWTH 3 DAYS Performed at [facility], [address] [place], [place] [address]    Report Status PENDING  Incomplete  Culture, blood (Routine x 2)     Status: None (Preliminary result)   Collection Time: 01/28/22  3:37 PM   Specimen: BLOOD  Result Value Ref Range Status   Specimen Description BLOOD BLOOD LEFT HAND  Final   Special Requests   Final    BOTTLES DRAWN AEROBIC AND ANAEROBIC Blood Culture results may not be optimal due to an inadequate volume of blood received in culture bottles   Culture   Final    NO GROWTH 3 DAYS Performed at [facility], [address], [place], [place] [address]    Report Status PENDING  Incomplete  Surgical pcr screen     Status: None   Collection Time: 01/30/22  1:35 AM   Specimen: Nasal Mucosa; Nasal Swab  Result Value Ref Range Status   MRSA, PCR NEGATIVE NEGATIVE Final    Staphylococcus aureus NEGATIVE NEGATIVE Final    Comment: (NOTE) The Xpert SA Assay (FDA approved for NASAL specimens in patients 45 years of age and older), is one component of a comprehensive surveillance program. It is not intended to diagnose infection nor to guide or monitor treatment. Performed at [facility], [address] [place], [place] [address]   Aerobic/Anaerobic Culture w Gram Stain (surgical/deep wound)     Status: None (Preliminary result)   Collection Time: 01/30/22  1:35 AM   Specimen: Foot; Wound  Result Value Ref Range Status   Specimen Description   Final    FOOT RIGTH Performed at [facility], [address] [place], [place] [address]    Special Requests   Final    NONE Performed at [facility], [place], [place] [address]    Gram Stain   Final    NO SQUAMOUS EPITHELIAL CELLS SEEN FEW WBC SEEN MODERATE GRAM POSITIVE COCCI    Culture   Final    FEW PROTEUS MIRABILIS SUSCEPTIBILITIES TO FOLLOW Performed at [facility], [place] [address], [place], [place] [address]    Report Status PENDING  Incomplete  Aerobic/Anaerobic Culture w Gram Stain (surgical/deep wound)     Status: None (Preliminary result)   Collection Time: 01/30/22  1:19 PM   Specimen: PATH Other; Tissue  Result Value Ref Range Status   Specimen Description   Final    WOUND Performed at [facility], [address] [place],  [address]    Special Requests   Final    RIGHT 5TH METATARSAL Performed at [facility], [address] [place],  [address]    Gram Stain  Final    FEW WBC PRESENT, PREDOMINANTLY PMN NO ORGANISMS SEEN    Culture   Final    NO GROWTH < 24 HOURS Performed at [facility] [address], [place], [place] [address]    Report Status PENDING  Incomplete    IMAGING RESULTS:  I have personally reviewed the films ?pockets of air in the soft tissue and osteo of the 5th  toe   Impression/Recommendation ? Infection of b/l feet in a patient with diabetes mellitus and peripheral neuropathy Possible PAD 5th toe osteo rt foot with surrounding wound- s/p amputation 2 nd toe infection left foot with ulcer on the plantar surface The toe is bluish in color indicating evolving ischemia He is to undergo angio He will need further surgery to the left toe PT is on vanco and zosyn- change the former to linezolid Will de-escalate once the cultures are ffully resulted On cymbalta- observe closely for Serotonin syndrome  DM- pt says sugar better controlled since he lost weight He is upset that after loosing 100 pounds, giving up smoking and alcohol  he has these infections.Marland Kitchenon sliding scale  HTN on amlodipine and lisinopril  And metoprolol  Discussed the management with patient and his father  ID will follow him peripherally this weekend-call if needed     Note:  This document was prepared using Dragon voice recognition software and may include unintentional dictation errors.

## 2022-01-31 NOTE — Progress Notes (Signed)
[facility]  Daily Progress Note   Subjective  -   Today the patient is resting comfortably eating this morning.  He is currently denies any significant pain postsurgery on his bilateral feet from yesterday.  Objective Vitals:   01/30/22 2006 01/31/22 0033 01/31/22 0427 01/31/22 0815  BP: 95/66 105/73 117/83 117/79  Pulse: 65 75 69 68  Resp: '15 15 15 16  '$ Temp: 98.8 F (37.1 C) 98.4 F (36.9 C) 98.3 F (36.8 C) 98.2 F (36.8 C)  TempSrc:      SpO2: 98% 97% 98% 99%  Weight:      Height:        Intake/Output Summary (Last 24 hours) at 01/31/2022 1344 Last data filed at 01/31/2022 0548 Gross per 24 hour  Intake 1589.95 ml  Output 1110 ml  Net 479.95 ml    PULM  CTAB CV  RRR VASC  unable to assess pedal pulses due to postoperative dressings bilaterally.  Laboratory CBC    Component Value Date/Time   WBC 10.6 (H) 01/29/2022 0506   HGB 13.4 01/29/2022 0506   HGB 13.9 10/30/2011 1123   HCT 39.8 01/29/2022 0506   HCT 37.0 (L) 10/10/2011 1407   PLT 249 01/29/2022 0506   PLT 191 10/30/2011 1123    BMET    Component Value Date/Time   NA 137 01/29/2022 0506   NA 140 11/08/2014 0851   NA 136 09/20/2011 0727   K 3.4 (L) 01/29/2022 0506   K 4.0 10/10/2011 1407   CL 100 01/29/2022 0506   CL 103 09/20/2011 0727   CO2 27 01/29/2022 0506   CO2 21 09/20/2011 0727   GLUCOSE 117 (H) 01/29/2022 0506   GLUCOSE 153 (H) 09/20/2011 0727   BUN 10 01/29/2022 0506   BUN 13 11/08/2014 0851   BUN 11 09/20/2011 0727   CREATININE 0.63 01/31/2022 0530   CREATININE 0.75 10/10/2011 1407   CALCIUM 9.4 01/29/2022 0506   CALCIUM 9.2 09/20/2011 0727   GFRNONAA >60 01/31/2022 0530   GFRNONAA >60 10/10/2011 1407   GFRAA >60 12/12/2015 1540   GFRAA >60 10/10/2011 1407    Assessment/Planning: As noted in the initial x-ray the patient had significant tibial vessel calcification.  This is also correlated with the ABI being noncompressible in the right foot.   Additionally, as noted by podiatry, the patient had very brisk bleeding in his left foot however the right was rather diminished.  Based on this we recommend the patient undergo a right lower extremity angiogram.  Due to the patient eating this morning we are unable to proceed with intervention.  The patient has other obligations that must be attended to next week, therefore we will have patient return on an outpatient basis for right lower extremity angiogram.  I have discussed the risk, benefits and alternatives.  I discussed the procedure itself.  The patient is agreeable to proceed.  Kris Hartmann  01/31/2022, 1:44 PM

## 2022-02-01 DIAGNOSIS — A419 Sepsis, unspecified organism: Secondary | ICD-10-CM | POA: Diagnosis not present

## 2022-02-01 DIAGNOSIS — L0390 Cellulitis, unspecified: Secondary | ICD-10-CM | POA: Diagnosis not present

## 2022-02-01 LAB — BLOOD CULTURE ID PANEL (REFLEXED) - BCID2

## 2022-02-01 LAB — GLUCOSE, CAPILLARY
Glucose-Capillary: 118 mg/dL — ABNORMAL HIGH (ref 70–99)
Glucose-Capillary: 140 mg/dL — ABNORMAL HIGH (ref 70–99)
Glucose-Capillary: 99 mg/dL (ref 70–99)
Glucose-Capillary: 133 mg/dL — ABNORMAL HIGH (ref 70–99)

## 2022-02-01 NOTE — Progress Notes (Signed)
PROGRESS NOTE    Daniel Ritter  JAS:[id number] DOB: 12-15-1975 DOA: 01/28/2022 PCP: Theotis Burrow, MD    Brief Narrative:   46 y.o. Caucasian male with medical history significant for anxiety, type diabetes mellitus, GERD, hypertension, seizure disorder, peripheral neuropathy and pancreatitis, presented to the emergency room with acute onset of worsening right foot swelling with erythema as well as deteriorating ulcer on the right fifth MTP which has been there for a couple of months.  Patient thought it was initially healing and later on noticed worsening redness and purulent discharge since Wednesday.  He also noticed a black bump per his report.  Since then erythematous been extending to his left foot dorsum.  He has a scab in his left first web space and plantar ulcer on the left foot which has not significantly worsened.  He denies any chest pain or dyspnea or cough.  No nausea or vomiting or abdominal pain.  No fever or chills.  No dysuria, oliguria or hematuria or flank pain.  MRI confirmed septic arthritis with associated osteomyelitis.  Seen by podiatry.  Status post surgical resection 7/27.  Surgical cultures and pathology pending   Assessment & Plan:   Principal Problem:   Sepsis due to cellulitis [facility]) Active Problems:   Acute osteomyelitis of ankle or foot, right (HCC)   Anxiety and depression   Essential hypertension   Seizures (HCC)   Type 2 diabetes mellitus with peripheral neuropathy (HCC)   Malnutrition of moderate degree  Acute osteomyelitis and septic arthritis of right foot Overlying cellulitis Sepsis secondary to above Sepsis physiology criteria met by tachycardia leukocytosis MRI confirms osteomyelitis with associated septic arthritis Wound with minimal drainage, tender to touch Status post surgical resection 7/27, tolerated procedure well Plan: Continue broad-spectrum IV antibiotics ID on board  Follow blood and wound cultures, NGTD Follow  surgical cultures and pathology Continue therapy evaluations Multimodal pain control  Type 2 diabetes mellitus with peripheral neuropathy Hemoglobin A1c 5.7, well controlled Plan: Hold oral metformin Continue Tradjenta Sliding-scale coverage Carb modified diet  Anxiety and depression PTA Xanax and Cymbalta  History of seizure disorder PTA Keppra  Essential hypertension BP controlled Continue PTA regimen of Toprol-XL, lisinopril, amlodipine, hydrochlorothiazide Vital per unit protocol  GERD Continue PPI    DVT prophylaxis: SQ Lovenox Code Status: Full Family Communication: Father at bedside 7/27 Disposition Plan: Status is: Inpatient Remains inpatient appropriate because: Acute osteomyelitis of right foot.  Postoperative day # 2 status post surgical resection.  Pain well controlled  Level of care: Med-Surg  Consultants:  Podiatry  Procedures:  Surgical resection right foot wound with partial ray amputation 7/27  Antimicrobials: Zosyn Zyvox   Subjective: Seen and examined.  Reports pain is improved from prior to surgery.  No visible distress  Objective: Vitals:   01/31/22 1704 01/31/22 1947 02/01/22 0423 02/01/22 0748  BP: 112/78 101/71 99/73 102/66  Pulse: 75 69 71 72  Resp: 16 15 15 16   Temp: 99.2 F (37.3 C) 98.6 F (37 C) 98.1 F (36.7 C) (!) 97.4 F (36.3 C)  TempSrc:  Oral Oral   SpO2: 95% 96% 98% 96%  Weight:      Height:        Intake/Output Summary (Last 24 hours) at 02/01/2022 1107 Last data filed at 02/01/2022 0700 Gross per 24 hour  Intake 840 ml  Output 400 ml  Net 440 ml   Filed Weights   01/28/22 1242 01/30/22 1215  Weight: 108.4 kg 108.4 kg  Examination:  General exam: NAD.  Resting comfortably in bed Respiratory system: Lungs clear.  Normal work of breathing.  Room air Cardiovascular system: S1-S2, RRR, no murmurs, no pedal edema Gastrointestinal system: Soft, NT/ND, normal bowel sounds Central nervous system: Alert  and oriented. No focal neurological deficits. Extremities: Symmetric 5 x 5 power. Skin: Right foot in surgical dressing, dressing CDI Psychiatry: Judgement and insight appear normal. Mood & affect appropriate.     Data Reviewed: I have personally reviewed following labs and imaging studies  CBC: Recent Labs  Lab 01/28/22 1251 01/29/22 0506  WBC 15.7* 10.6*  NEUTROABS 12.6*  --   HGB 14.9 13.4  HCT 43.6 39.8  MCV 88.4 89.0  PLT 311 726   Basic Metabolic Panel: Recent Labs  Lab 01/28/22 1251 01/29/22 0506 01/31/22 0530  NA 136 137  --   K 3.9 3.4*  --   CL 98 100  --   CO2 28 27  --   GLUCOSE 154* 117*  --   BUN 15 10  --   CREATININE 1.06 0.79 0.63  CALCIUM 9.9 9.4  --    GFR: Estimated Creatinine Clearance: 147.4 mL/min (by C-G formula based on SCr of 0.63 mg/dL). Liver Function Tests: Recent Labs  Lab 01/28/22 1251  AST 36  ALT 23  ALKPHOS 82  BILITOT 0.9  PROT 9.5*  ALBUMIN 3.9   No results for input(s): "LIPASE", "AMYLASE" in the last 168 hours. No results for input(s): "AMMONIA" in the last 168 hours. Coagulation Profile: Recent Labs  Lab 01/28/22 1251 01/29/22 0506  INR 1.1 1.1   Cardiac Enzymes: No results for input(s): "CKTOTAL", "CKMB", "CKMBINDEX", "TROPONINI" in the last 168 hours. BNP (last 3 results) No results for input(s): "PROBNP" in the last 8760 hours. HbA1C: No results for input(s): "HGBA1C" in the last 72 hours.  CBG: Recent Labs  Lab 01/31/22 0730 01/31/22 1159 01/31/22 1700 01/31/22 2047 02/01/22 0801  GLUCAP 104* 107* 119* 111* 118*   Lipid Profile: No results for input(s): "CHOL", "HDL", "LDLCALC", "TRIG", "CHOLHDL", "LDLDIRECT" in the last 72 hours. Thyroid Function Tests: No results for input(s): "TSH", "T4TOTAL", "FREET4", "T3FREE", "THYROIDAB" in the last 72 hours. Anemia Panel: No results for input(s): "VITAMINB12", "FOLATE", "FERRITIN", "TIBC", "IRON", "RETICCTPCT" in the last 72 hours. Sepsis Labs: Recent  Labs  Lab 01/28/22 1251 01/28/22 1840 01/29/22 0506  PROCALCITON  --   --  <0.10  LATICACIDVEN 0.9 1.1  --     Recent Results (from the past 240 hour(s))  Culture, blood (Routine x 2)     Status: None (Preliminary result)   Collection Time: 01/28/22 12:51 PM   Specimen: BLOOD  Result Value Ref Range Status   Specimen Description BLOOD LEFT ANTECUBITAL  Final   Special Requests   Final    BOTTLES DRAWN AEROBIC AND ANAEROBIC Blood Culture adequate volume   Culture  Setup Time   Final    Organism ID to follow GRAM POSITIVE COCCI ANAEROBIC BOTTLE ONLY CRITICAL RESULT CALLED TO, READ BACK BY AND VERIFIED WITH: WILL ANDERSON ON 02/01/22 AT 2035 QSD Performed at [facility], [address] [place], [place] [address]    Culture [facility] POSITIVE COCCI  Final   Report Status PENDING  Incomplete  Blood Culture ID Panel (Reflexed)     Status: None   Collection Time: 01/28/22 12:51 PM  Result Value Ref Range Status   Enterococcus faecalis NOT DETECTED NOT DETECTED Final   Enterococcus Faecium NOT DETECTED NOT DETECTED Final  Listeria monocytogenes NOT DETECTED NOT DETECTED Final   Staphylococcus species NOT DETECTED NOT DETECTED Final   Staphylococcus aureus (BCID) NOT DETECTED NOT DETECTED Final   Staphylococcus epidermidis NOT DETECTED NOT DETECTED Final   Staphylococcus lugdunensis NOT DETECTED NOT DETECTED Final   Streptococcus species NOT DETECTED NOT DETECTED Final   Streptococcus agalactiae NOT DETECTED NOT DETECTED Final   Streptococcus pneumoniae NOT DETECTED NOT DETECTED Final   Streptococcus pyogenes NOT DETECTED NOT DETECTED Final   A.calcoaceticus-baumannii NOT DETECTED NOT DETECTED Final   Bacteroides fragilis NOT DETECTED NOT DETECTED Final   Enterobacterales NOT DETECTED NOT DETECTED Final   Enterobacter cloacae complex NOT DETECTED NOT DETECTED Final   Escherichia coli NOT DETECTED NOT DETECTED Final   Klebsiella aerogenes NOT DETECTED NOT DETECTED Final    Klebsiella oxytoca NOT DETECTED NOT DETECTED Final   Klebsiella pneumoniae NOT DETECTED NOT DETECTED Final   Proteus species NOT DETECTED NOT DETECTED Final   Salmonella species NOT DETECTED NOT DETECTED Final   Serratia marcescens NOT DETECTED NOT DETECTED Final   Haemophilus influenzae NOT DETECTED NOT DETECTED Final   Neisseria meningitidis NOT DETECTED NOT DETECTED Final   Pseudomonas aeruginosa NOT DETECTED NOT DETECTED Final   Stenotrophomonas maltophilia NOT DETECTED NOT DETECTED Final   Candida albicans NOT DETECTED NOT DETECTED Final   Candida auris NOT DETECTED NOT DETECTED Final   Candida glabrata NOT DETECTED NOT DETECTED Final   Candida krusei NOT DETECTED NOT DETECTED Final   Candida parapsilosis NOT DETECTED NOT DETECTED Final   Candida tropicalis NOT DETECTED NOT DETECTED Final   Cryptococcus neoformans/gattii NOT DETECTED NOT DETECTED Final    Comment: Performed at [facility], [address] [place], [place] [address]  Culture, blood (Routine x 2)     Status: None (Preliminary result)   Collection Time: 01/28/22  3:37 PM   Specimen: BLOOD  Result Value Ref Range Status   Specimen Description BLOOD BLOOD LEFT HAND  Final   Special Requests   Final    BOTTLES DRAWN AEROBIC AND ANAEROBIC Blood Culture results may not be optimal due to an inadequate volume of blood received in culture bottles   Culture   Final    NO GROWTH 4 DAYS Performed at [facility], [address] [place], [place] [address]    Report Status PENDING  Incomplete  Surgical pcr screen     Status: None   Collection Time: 01/30/22  1:35 AM   Specimen: Nasal Mucosa; Nasal Swab  Result Value Ref Range Status   MRSA, PCR NEGATIVE NEGATIVE Final   Staphylococcus aureus NEGATIVE NEGATIVE Final    Comment: (NOTE) The Xpert SA Assay (FDA approved for NASAL specimens in patients 4 years of age and older), is one component of a comprehensive surveillance program. It is not intended  to diagnose infection nor to guide or monitor treatment. Performed at [facility], [address] [place], [place] [address]   Aerobic/Anaerobic Culture w Gram Stain (surgical/deep wound)     Status: None (Preliminary result)   Collection Time: 01/30/22  1:35 AM   Specimen: Foot; Wound  Result Value Ref Range Status   Specimen Description   Final    FOOT RIGTH Performed at [facility], [address], [place], [place] [address]    Special Requests   Final    NONE Performed at [facility], [address] [place], [place] [address]    Gram Stain   Final    NO SQUAMOUS  EPITHELIAL CELLS SEEN FEW WBC SEEN MODERATE GRAM POSITIVE COCCI    Culture   Final    FEW PROTEUS MIRABILIS SUSCEPTIBILITIES TO FOLLOW Performed at [facility], [place] [address] [place], [place] [address]    Report Status PENDING  Incomplete  Aerobic/Anaerobic Culture w Gram Stain (surgical/deep wound)     Status: None (Preliminary result)   Collection Time: 01/30/22  1:19 PM   Specimen: PATH Other; Tissue  Result Value Ref Range Status   Specimen Description   Final    WOUND Performed at [facility], [address] [place], [place] [address]    Special Requests   Final    RIGHT 5TH METATARSAL Performed at [facility], [address] [place], [place] [address]    Gram Stain   Final    FEW WBC PRESENT, PREDOMINANTLY PMN NO ORGANISMS SEEN    Culture   Final    NO GROWTH < 24 HOURS Performed at [facility] [address], [place], [place] [address]    Report Status PENDING  Incomplete         Radiology Studies: No results found.      Scheduled Meds:  ALPRAZolam  0.25 mg Oral BID   amLODipine  10 mg Oral Daily   vitamin C  500 mg Oral BID   DULoxetine  20 mg Oral Daily   enoxaparin (LOVENOX) injection  55 mg Subcutaneous Q24H   hydrochlorothiazide  12.5 mg Oral Daily   insulin aspart  0-15 Units Subcutaneous TID WC    insulin aspart  0-5 Units Subcutaneous QHS   levETIRAcetam  500 mg Oral BID   linagliptin  5 mg Oral Daily   lisinopril  20 mg Oral Daily   metoprolol succinate  100 mg Oral Daily   multivitamin with minerals  1 tablet Oral Daily   nutrition supplement (JUVEN)  1 packet Oral BID BM   pantoprazole  40 mg Oral Daily   Ensure Max Protein  11 oz Oral QHS   zinc sulfate  220 mg Oral Daily   Continuous Infusions:  linezolid (ZYVOX) IV 600 mg (02/01/22 0830)   piperacillin-tazobactam (ZOSYN)  IV 3.375 g (02/01/22 3545)     LOS: 4 days      Sidney Ace, MD [facility]   If 7PM-7AM, please contact night-coverage  02/01/2022, 11:07 AM

## 2022-02-01 NOTE — Plan of Care (Signed)
  Problem: Fluid Volume: Goal: Hemodynamic stability will improve Outcome: Progressing   Problem: Clinical Measurements: Goal: Diagnostic test results will improve Outcome: Progressing   Problem: Clinical Measurements: Goal: Signs and symptoms of infection will decrease Outcome: Progressing   Problem: Respiratory: Goal: Ability to maintain adequate ventilation will improve Outcome: Progressing   Problem: Education: Goal: Knowledge of General Education information will improve Description: Including pain rating scale, medication(s)/side effects and non-pharmacologic comfort measures Outcome: Progressing   Problem: Health Behavior/Discharge Planning: Goal: Ability to manage health-related needs will improve Outcome: Progressing   Problem: Clinical Measurements: Goal: Ability to maintain clinical measurements within normal limits will improve Outcome: Progressing   Problem: Clinical Measurements: Goal: Will remain free from infection Outcome: Progressing   Problem: Clinical Measurements: Goal: Diagnostic test results will improve Outcome: Progressing   Problem: Clinical Measurements: Goal: Respiratory complications will improve Outcome: Progressing   Problem: Clinical Measurements: Goal: Cardiovascular complication will be avoided Outcome: Progressing   Problem: Activity: Goal: Risk for activity intolerance will decrease Outcome: Progressing   Problem: Nutrition: Goal: Adequate nutrition will be maintained Outcome: Progressing   Problem: Coping: Goal: Level of anxiety will decrease Outcome: Progressing   Problem: Elimination: Goal: Will not experience complications related to bowel motility Outcome: Progressing   Problem: Elimination: Goal: Will not experience complications related to urinary retention Outcome: Progressing   Problem: Pain Managment: Goal: General experience of comfort will improve Outcome: Progressing   Problem: Safety: Goal: Ability  to remain free from injury will improve Outcome: Progressing   Problem: Skin Integrity: Goal: Risk for impaired skin integrity will decrease Outcome: Progressing   Problem: Education: Goal: Ability to describe self-care measures that may prevent or decrease complications (Diabetes Survival Skills Education) will improve Outcome: Progressing   Problem: Education: Goal: Individualized Educational Video(s) Outcome: Progressing   Problem: Coping: Goal: Ability to adjust to condition or change in health will improve Outcome: Progressing   Problem: Fluid Volume: Goal: Ability to maintain a balanced intake and output will improve Outcome: Progressing   Problem: Health Behavior/Discharge Planning: Goal: Ability to identify and utilize available resources and services will improve Outcome: Progressing   Problem: Health Behavior/Discharge Planning: Goal: Ability to manage health-related needs will improve Outcome: Progressing   Problem: Metabolic: Goal: Ability to maintain appropriate glucose levels will improve Outcome: Progressing   Problem: Nutritional: Goal: Progress toward achieving an optimal weight will improve Outcome: Progressing   Problem: Skin Integrity: Goal: Risk for impaired skin integrity will decrease Outcome: Progressing   Problem: Tissue Perfusion: Goal: Adequacy of tissue perfusion will improve Outcome: Progressing

## 2022-02-01 NOTE — Progress Notes (Signed)
PHARMACY - PHYSICIAN COMMUNICATION CRITICAL VALUE ALERT - BLOOD CULTURE IDENTIFICATION (BCID)  [name] is an 46 y.o. male who presented to [facility] on 01/28/2022 with a chief complaint of worsening right foot swelling  Assessment:  1 of 4 (anaerobic) GPCs  Name of physician (or Provider) Contacted: Dr. Priscella Mann  Current antibiotics: Linezolid and Zosyn  Changes to prescribed antibiotics recommended:  Patient is on recommended antibiotics - No changes needed  Results for orders placed or performed during the hospital encounter of 01/28/22  Blood Culture ID Panel (Reflexed) (Collected: 01/28/2022 12:51 PM)  Result Value Ref Range   Enterococcus faecalis NOT DETECTED NOT DETECTED   Enterococcus Faecium NOT DETECTED NOT DETECTED   Listeria monocytogenes NOT DETECTED NOT DETECTED   Staphylococcus species NOT DETECTED NOT DETECTED   Staphylococcus aureus (BCID) NOT DETECTED NOT DETECTED   Staphylococcus epidermidis NOT DETECTED NOT DETECTED   Staphylococcus lugdunensis NOT DETECTED NOT DETECTED   Streptococcus species NOT DETECTED NOT DETECTED   Streptococcus agalactiae NOT DETECTED NOT DETECTED   Streptococcus pneumoniae NOT DETECTED NOT DETECTED   Streptococcus pyogenes NOT DETECTED NOT DETECTED   A.calcoaceticus-baumannii NOT DETECTED NOT DETECTED   Bacteroides fragilis NOT DETECTED NOT DETECTED   Enterobacterales NOT DETECTED NOT DETECTED   Enterobacter cloacae complex NOT DETECTED NOT DETECTED   Escherichia coli NOT DETECTED NOT DETECTED   Klebsiella aerogenes NOT DETECTED NOT DETECTED   Klebsiella oxytoca NOT DETECTED NOT DETECTED   Klebsiella pneumoniae NOT DETECTED NOT DETECTED   Proteus species NOT DETECTED NOT DETECTED   Salmonella species NOT DETECTED NOT DETECTED   Serratia marcescens NOT DETECTED NOT DETECTED   Haemophilus influenzae NOT DETECTED NOT DETECTED   Neisseria meningitidis NOT DETECTED NOT DETECTED   Pseudomonas aeruginosa NOT DETECTED NOT DETECTED    Stenotrophomonas maltophilia NOT DETECTED NOT DETECTED   Candida albicans NOT DETECTED NOT DETECTED   Candida auris NOT DETECTED NOT DETECTED   Candida glabrata NOT DETECTED NOT DETECTED   Candida krusei NOT DETECTED NOT DETECTED   Candida parapsilosis NOT DETECTED NOT DETECTED   Candida tropicalis NOT DETECTED NOT DETECTED   Cryptococcus neoformans/gattii NOT DETECTED NOT DETECTED    Daniel Ritter 02/01/2022  12:41 PM

## 2022-02-01 NOTE — Progress Notes (Signed)
Daily Progress Note   Subjective  - 2 Days Post-Op  Follow-up bilateral foot ulcers.  Doing well.  Objective Vitals:   01/31/22 1947 02/01/22 0423 02/01/22 0748 02/01/22 1527  BP: 101/71 99/73 102/66 99/65  Pulse: 69 71 72 67  Resp: '15 15 16 16  '$ Temp: 98.6 F (37 C) 98.1 F (36.7 C) (!) 97.4 F (36.3 C) 98.3 F (36.8 C)  TempSrc: Oral Oral  Oral  SpO2: 96% 98% 96% 97%  Weight:      Height:        Physical Exam: Bilateral foot ulcers plantar aspect are filling in nicely.  Good granular tissue noted.  Incision to the dorsal second MTPJ and dorsal lateral right foot are well coapted.  The right foot looks to be very stable with good perfusion of the skin flaps.  The left second toe ecchymosis has diminished somewhat.  Some mild superficial blistering of the distal aspect of the toe dorsally was noted today.  Erythema has diminished as well.  See clinical pictures.       Laboratory CBC    Component Value Date/Time   WBC 10.6 (H) 01/29/2022 0506   HGB 13.4 01/29/2022 0506   HGB 13.9 10/30/2011 1123   HCT 39.8 01/29/2022 0506   HCT 37.0 (L) 10/10/2011 1407   PLT 249 01/29/2022 0506   PLT 191 10/30/2011 1123    BMET    Component Value Date/Time   NA 137 01/29/2022 0506   NA 140 11/08/2014 0851   NA 136 09/20/2011 0727   K 3.4 (L) 01/29/2022 0506   K 4.0 10/10/2011 1407   CL 100 01/29/2022 0506   CL 103 09/20/2011 0727   CO2 27 01/29/2022 0506   CO2 21 09/20/2011 0727   GLUCOSE 117 (H) 01/29/2022 0506   GLUCOSE 153 (H) 09/20/2011 0727   BUN 10 01/29/2022 0506   BUN 13 11/08/2014 0851   BUN 11 09/20/2011 0727   CREATININE 0.63 01/31/2022 0530   CREATININE 0.75 10/10/2011 1407   CALCIUM 9.4 01/29/2022 0506   CALCIUM 9.2 09/20/2011 0727   GFRNONAA >60 01/31/2022 0530   GFRNONAA >60 10/10/2011 1407   GFRAA >60 12/12/2015 1540   GFRAA >60 10/10/2011 1407    Assessment/Planning: Osteomyelitis right fifth metatarsophalangeal joint and left second  metatarsophalangeal joints with diabetic foot ulcerations  Diabetes with neuropathy  Peripheral vascular disease  Angio is planned for early in the week. Appreciate infectious disease assistance.  Will likely need long-term antibiotics. Continue with weightbearing to the heel only with transfer.  Otherwise should try to remain as nonweightbearing as possible.  Should use wheelchair as much as possible.  Assistance with transfer. Dressing change orders were written while in house.  Dressing orders for discharge were written as well.  This consists of calcium alginate with silver to the ulcerative sites plantarly.  Paint the incisions with Betadine.  Cover with bulky dressing.  Performed 3 times a week. Patient should follow-up with podiatry in 2 weeks. At this time no plans for further surgical intervention from podiatry standpoint.  Samara Deist A  02/01/2022, 4:48 PM

## 2022-02-01 NOTE — Progress Notes (Signed)
Physical Therapy Treatment Patient Details Name: Daniel Ritter MRN: [id number] DOB: 08/20/1975 Today's Date: 02/01/2022   History of Present Illness Pt is a 46 y.o. male presenting to hospital 7/25 with foot wounds.  Pt admitted with sepsis d/t cellulitis and acute osteomyelitis of R ankle/foot.  S/p 7/27: Excision second metatarsophalangeal joint left foot; Partial fifth ray amputation right foot; Full-thickness excisional ulcer debridement to tendon plantar left foot; Full-thickness ulcer debridement to subcutaneous tissue plantar right foot.  PMH includes DM, CVA, htn, neuropathy, anxiety, seizures, cytotoxic brain edema, ICH, and ACL repair L.    PT Comments    Pt resting in bed upon PT arrival; agreeable to PT session.  Performed B LE semi-supine ex's in bed to improve LE strength; pt also cued for transverse abdominal contractions during SLR exercise for core strengthening.  Pt performed squat pivot bed to recliner with SBA (cueing for WB'ing status and technique).  Will continue to focus on strengthening and progress to w/c training as appropriate.    Recommendations for follow up therapy are one component of a multi-disciplinary discharge planning process, led by the attending physician.  Recommendations may be updated based on patient status, additional functional criteria and insurance authorization.  Follow Up Recommendations  Follow physician's recommendations for discharge plan and follow up therapies     Assistance Recommended at Discharge Set up Supervision/Assistance  Patient can return home with the following A little help with walking and/or transfers;A little help with bathing/dressing/bathroom;Assistance with cooking/housework;Assist for transportation;Help with stairs or ramp for entrance   Equipment Recommendations  BSC/3in1;Wheelchair (measurements PT);Wheelchair cushion (measurements PT)    Recommendations for Other Services       Precautions / Restrictions  Precautions Precautions: Fall Precaution Comments: B post op shoes Restrictions Weight Bearing Restrictions: Yes RLE Weight Bearing: Partial weight bearing LLE Weight Bearing: Partial weight bearing Other Position/Activity Restrictions: Heel contact and surgical shoes B LE's; Heel WB for transfers only (pt can WB slightly more on L than R foot)     Mobility  Bed Mobility Overal bed mobility: Modified Independent             General bed mobility comments: Supine to sitting edge of bed--no difficulties noted    Transfers Overall transfer level: Needs assistance Equipment used: None Transfers: Bed to chair/wheelchair/BSC       Squat pivot transfers: Supervision     General transfer comment: squat pivot bed to recliner towards R side; vc's for WB'ing precautions and overall technique    Ambulation/Gait               General Gait Details: Not appropriate at this time--transfers only per orders and MD Odelia Gage             Wheelchair Mobility    Modified Rankin (Stroke Patients Only)       Balance Overall balance assessment: Needs assistance Sitting-balance support: No upper extremity supported, Feet supported (B heels WB'ing) Sitting balance-Leahy Scale: Good Sitting balance - Comments: steady sitting reaching within BOS                                    Cognition Arousal/Alertness: Awake/alert Behavior During Therapy: WFL for tasks assessed/performed Overall Cognitive Status: Within Functional Limits for tasks assessed  Exercises Total Joint Exercises Towel Squeeze: AROM, Strengthening, Both, 10 reps, Supine (pillow between pt's knees) General Exercises - Lower Extremity Ankle Circles/Pumps: AROM, Strengthening, Both, 10 reps, Supine Heel Slides: AROM, Strengthening, Both, 10 reps, Supine Straight Leg Raises: AROM, Strengthening, Both, 10 reps, Supine     General Comments General comments (skin integrity, edema, etc.): B LE dressings in place      Pertinent Vitals/Pain Pain Assessment Pain Assessment: 0-10 Pain Score: 4  Pain Descriptors / Indicators: Aching Pain Intervention(s): Limited activity within patient's tolerance, Monitored during session, Premedicated before session, Repositioned    Home Living                          Prior Function            PT Goals (current goals can now be found in the care plan section) Acute Rehab PT Goals Patient Stated Goal: to go home and improve wounds PT Goal Formulation: With patient Time For Goal Achievement: 02/14/22 Potential to Achieve Goals: Good Additional Goals Additional Goal #1: Pt able to propel manual w/c >150 feet modified independently for safe mobility within home. Progress towards PT goals: Progressing toward goals    Frequency    7X/week      PT Plan Current plan remains appropriate    Co-evaluation              AM-PAC PT "6 Clicks" Mobility   Outcome Measure  Help needed turning from your back to your side while in a flat bed without using bedrails?: None Help needed moving from lying on your back to sitting on the side of a flat bed without using bedrails?: None Help needed moving to and from a bed to a chair (including a wheelchair)?: A Little Help needed standing up from a chair using your arms (e.g., wheelchair or bedside chair)?: A Little Help needed to walk in hospital room?: Total Help needed climbing 3-5 steps with a railing? : Total 6 Click Score: 16    End of Session Equipment Utilized During Treatment: Other (comment) (B post op shoes) Activity Tolerance: Patient tolerated treatment well Patient left: in chair;with call bell/phone within reach;with chair alarm set;Other (comment) (B LE's elevated on pillows) Nurse Communication: Mobility status;Precautions;Weight bearing status PT Visit Diagnosis: Other abnormalities of gait  and mobility (R26.89);Muscle weakness (generalized) (M62.81);Pain Pain - Right/Left: Right (Left) Pain - part of body: Ankle and joints of foot     Time: 7124-5809 PT Time Calculation (min) (ACUTE ONLY): 23 min  Charges:  $Therapeutic Exercise: 8-22 mins $Therapeutic Activity: 8-22 mins                     Leitha Bleak, PT 02/01/22, 10:33 AM

## 2022-02-02 DIAGNOSIS — L0390 Cellulitis, unspecified: Secondary | ICD-10-CM | POA: Diagnosis not present

## 2022-02-02 DIAGNOSIS — A419 Sepsis, unspecified organism: Secondary | ICD-10-CM | POA: Diagnosis not present

## 2022-02-02 LAB — CBC WITH DIFFERENTIAL/PLATELET
Abs Immature Granulocytes: 0.3 10*3/uL — ABNORMAL HIGH (ref 0.00–0.07)
Basophils Absolute: 0.2 10*3/uL — ABNORMAL HIGH (ref 0.0–0.1)
Basophils Relative: 1 %
Eosinophils Absolute: 0.2 10*3/uL (ref 0.0–0.5)
Eosinophils Relative: 2 %
HCT: 40.7 % (ref 39.0–52.0)
Hemoglobin: 13.9 g/dL (ref 13.0–17.0)
Immature Granulocytes: 3 %
Lymphocytes Relative: 23 %
Lymphs Abs: 2.7 10*3/uL (ref 0.7–4.0)
MCH: 30 pg (ref 26.0–34.0)
MCHC: 34.2 g/dL (ref 30.0–36.0)
MCV: 87.9 fL (ref 80.0–100.0)
Monocytes Absolute: 0.8 10*3/uL (ref 0.1–1.0)
Monocytes Relative: 6 %
Neutro Abs: 7.7 10*3/uL (ref 1.7–7.7)
Neutrophils Relative %: 65 %
Platelets: 254 10*3/uL (ref 150–400)
RBC: 4.63 MIL/uL (ref 4.22–5.81)
RDW: 12.8 % (ref 11.5–15.5)
WBC: 11.9 10*3/uL — ABNORMAL HIGH (ref 4.0–10.5)
nRBC: 0 % (ref 0.0–0.2)

## 2022-02-02 LAB — BASIC METABOLIC PANEL
Anion gap: 8 (ref 5–15)
BUN: 18 mg/dL (ref 6–20)
CO2: 23 mmol/L (ref 22–32)
Calcium: 9.4 mg/dL (ref 8.9–10.3)
Chloride: 103 mmol/L (ref 98–111)
Creatinine, Ser: 0.67 mg/dL (ref 0.61–1.24)
GFR, Estimated: >60 mL/min (ref >60)
Glucose, Bld: 107 mg/dL — ABNORMAL HIGH (ref 70–99)
Potassium: 3.9 mmol/L (ref 3.5–5.1)
Sodium: 134 mmol/L — ABNORMAL LOW (ref 135–145)

## 2022-02-02 LAB — GLUCOSE, CAPILLARY
Glucose-Capillary: 121 mg/dL — ABNORMAL HIGH (ref 70–99)
Glucose-Capillary: 119 mg/dL — ABNORMAL HIGH (ref 70–99)
Glucose-Capillary: 106 mg/dL — ABNORMAL HIGH (ref 70–99)
Glucose-Capillary: 104 mg/dL — ABNORMAL HIGH (ref 70–99)

## 2022-02-02 LAB — CULTURE, BLOOD (ROUTINE X 2): Culture: NO GROWTH

## 2022-02-02 NOTE — Progress Notes (Signed)
Physical Therapy Treatment Patient Details Name: Daniel Ritter MRN: [id number] DOB: Nov 08, 1975 Today's Date: 02/02/2022   History of Present Illness Pt is a 46 y.o. male presenting to hospital 7/25 with foot wounds.  Pt admitted with sepsis d/t cellulitis and acute osteomyelitis of R ankle/foot.  S/p 7/27: Excision second metatarsophalangeal joint left foot; Partial fifth ray amputation right foot; Full-thickness excisional ulcer debridement to tendon plantar left foot; Full-thickness ulcer debridement to subcutaneous tissue plantar right foot.  PMH includes DM, CVA, htn, neuropathy, anxiety, seizures, cytotoxic brain edema, ICH, and ACL repair L.    PT Comments    Pt resting in bed upon PT arrival.  NT reporting pt was in bathroom this morning--therapist asked pt how he got to the bathroom and pt reported that he walked with B post op shoes on.  Therapist re-educated pt that per MD Vickki Muff pt should not be walking (re-educated pt on Aubrey precautions and activity restrictions per MD instructions--including to use w/c as much as possible)--pt verbalizing understanding; therapist also discussed these precautions/activity restrictions with pt's nurse and pt's NT.  During session pt modified independent semi-supine to sitting edge of bed; SBA with squat pivot transfers (reviewing WB'ing precautions and overall technique); and SBA propelling manual w/c 380 feet with B UE's  (vc's for overall technique for propelling w/c, turns (especially in tight spaces), brake use, and leg rest use).  Pt reports having manual w/c with elevating legrests at home that he plans to use.  Will continue to focus on strengthening and w/c level functional mobility during hospitalization.   Recommendations for follow up therapy are one component of a multi-disciplinary discharge planning process, led by the attending physician.  Recommendations may be updated based on patient status, additional functional criteria and insurance  authorization.  Follow Up Recommendations  Follow physician's recommendations for discharge plan and follow up therapies     Assistance Recommended at Discharge Set up Supervision/Assistance  Patient can return home with the following A little help with walking and/or transfers;A little help with bathing/dressing/bathroom;Assistance with cooking/housework;Assist for transportation;Help with stairs or ramp for entrance   Equipment Recommendations  BSC/3in1;Wheelchair (measurements PT);Wheelchair cushion (measurements PT)    Recommendations for Other Services       Precautions / Restrictions Precautions Precautions: Fall Precaution Comments: B post op shoes Restrictions Weight Bearing Restrictions: Yes RLE Weight Bearing: Partial weight bearing LLE Weight Bearing: Partial weight bearing Other Position/Activity Restrictions: Heel contact and surgical shoes B LE's; Heel WB for transfers only (pt can WB slightly more on L than R foot)     Mobility  Bed Mobility Overal bed mobility: Modified Independent             General bed mobility comments: Supine to sitting edge of bed--no difficulties noted    Transfers Overall transfer level: Needs assistance Equipment used: None Transfers: Bed to chair/wheelchair/BSC       Squat pivot transfers: Supervision     General transfer comment: squat pivot bed to manual w/c towards R side and then manual w/c to recliner towards L side; vc's for WB'ing precautions and overall technique    Ambulation/Gait               General Gait Details: Not appropriate at this time--transfers only per orders and MD Kaleen Mask mobility: Yes Wheelchair propulsion: Both upper extremities Wheelchair parts: Supervision/cueing Distance: 380 feet Wheelchair  Assistance Details (indicate cue type and reason): vc's for overall technique for propelling w/c, turns  (especially in tight spaces), brake use, and leg rest use  Modified Rankin (Stroke Patients Only)       Balance Overall balance assessment: Needs assistance Sitting-balance support: No upper extremity supported, Feet unsupported Sitting balance-Leahy Scale: Good Sitting balance - Comments: steady sitting reaching within BOS                                    Cognition Arousal/Alertness: Awake/alert Behavior During Therapy: WFL for tasks assessed/performed Overall Cognitive Status: Within Functional Limits for tasks assessed                                          Exercises      General Comments General comments (skin integrity, edema, etc.): B LE dressings in place.  Nursing cleared pt for participation in physical therapy.  Pt agreeable to PT session.      Pertinent Vitals/Pain Pain Assessment Pain Assessment: 0-10 Pain Score: 5  Pain Location: R>L feet Pain Descriptors / Indicators: Sore Pain Intervention(s): Limited activity within patient's tolerance, Monitored during session, Repositioned, Patient requesting pain meds-RN notified Vitals (HR and O2 on room air) stable and WFL throughout treatment session.    Home Living                          Prior Function            PT Goals (current goals can now be found in the care plan section) Acute Rehab PT Goals Patient Stated Goal: to go home and improve wounds PT Goal Formulation: With patient Time For Goal Achievement: 02/14/22 Potential to Achieve Goals: Good Additional Goals Additional Goal #1: Pt able to propel manual w/c >150 feet modified independently for safe mobility within home. Progress towards PT goals: Progressing toward goals    Frequency    7X/week      PT Plan Current plan remains appropriate    Co-evaluation              AM-PAC PT "6 Clicks" Mobility   Outcome Measure  Help needed turning from your back to your side while in a flat  bed without using bedrails?: None Help needed moving from lying on your back to sitting on the side of a flat bed without using bedrails?: None Help needed moving to and from a bed to a chair (including a wheelchair)?: A Little Help needed standing up from a chair using your arms (e.g., wheelchair or bedside chair)?: A Little Help needed to walk in hospital room?: Total Help needed climbing 3-5 steps with a railing? : Total 6 Click Score: 16    End of Session Equipment Utilized During Treatment: Other (comment) (B post op shoes) Activity Tolerance: Patient tolerated treatment well Patient left: in chair;with call bell/phone within reach;with chair alarm set;Other (comment) (B heels floating via pillow support) Nurse Communication: Mobility status;Precautions;Weight bearing status PT Visit Diagnosis: Other abnormalities of gait and mobility (R26.89);Muscle weakness (generalized) (M62.81);Pain Pain - Right/Left: Right (Left) Pain - part of body: Ankle and joints of foot     Time: 2992-4268 PT Time Calculation (min) (ACUTE ONLY): 33 min  Charges:  $Therapeutic Activity: 8-22 mins $Wheel Chair Management: 8-22 mins  Leitha Bleak, PT 02/02/22, 10:06 AM

## 2022-02-02 NOTE — Progress Notes (Signed)
PROGRESS NOTE    Daniel Ritter  MWN:[id number] DOB: 09-24-1975 DOA: 01/28/2022 PCP: Theotis Burrow, MD    Brief Narrative:   46 y.o. Caucasian male with medical history significant for anxiety, type diabetes mellitus, GERD, hypertension, seizure disorder, peripheral neuropathy and pancreatitis, presented to the emergency room with acute onset of worsening right foot swelling with erythema as well as deteriorating ulcer on the right fifth MTP which has been there for a couple of months.  Patient thought it was initially healing and later on noticed worsening redness and purulent discharge since Wednesday.  He also noticed a black bump per his report.  Since then erythematous been extending to his left foot dorsum.  He has a scab in his left first web space and plantar ulcer on the left foot which has not significantly worsened.  He denies any chest pain or dyspnea or cough.  No nausea or vomiting or abdominal pain.  No fever or chills.  No dysuria, oliguria or hematuria or flank pain.  MRI confirmed septic arthritis with associated osteomyelitis.  Seen by podiatry.  Status post surgical resection 7/27.  Surgical cultures and pathology pending   Assessment & Plan:   Principal Problem:   Sepsis due to cellulitis [facility]) Active Problems:   Acute osteomyelitis of ankle or foot, right (HCC)   Anxiety and depression   Essential hypertension   Seizures (HCC)   Type 2 diabetes mellitus with peripheral neuropathy (HCC)   Malnutrition of moderate degree  Acute osteomyelitis and septic arthritis of right foot Overlying cellulitis Sepsis secondary to above Sepsis physiology criteria met by tachycardia leukocytosis MRI confirms osteomyelitis with associated septic arthritis Wound with minimal drainage, tender to touch Status post surgical resection 7/27, tolerated procedure well Plan: Continue broad-spectrum IV antibiotics Anticipate will need long-term IV antibiotics ID on board   Follow blood and wound cultures, NGTD Follow surgical cultures and pathology, pending Continue therapy evaluations Multimodal pain control, pain well controlled  Type 2 diabetes mellitus with peripheral neuropathy Hemoglobin A1c 5.7, well controlled Plan: Continue sliding-scale coverage carb modified diet Continue Tradjenta  Anxiety and depression PTA Xanax and Cymbalta  History of seizure disorder PTA Keppra  Essential hypertension BP controlled Continue PTA regimen of Toprol-XL, lisinopril, amlodipine, hydrochlorothiazide Vital per unit protocol  GERD Continue PPI    DVT prophylaxis: SQ Lovenox Code Status: Full Family Communication: Father at bedside 7/27 Disposition Plan: Status is: Inpatient Remains inpatient appropriate because: Acute osteomyelitis of right foot.  Postoperative day # 3 status post surgical resection.  Pain well controlled  Level of care: Med-Surg  Consultants:  Podiatry  Procedures:  Surgical resection right foot wound with partial ray amputation 7/27  Antimicrobials: Zosyn Zyvox   Subjective: Seen and examined.  Reports pain is improved from prior to surgery.  No visible distress  Objective: Vitals:   02/01/22 0748 02/01/22 1527 02/01/22 2015 02/02/22 0503  BP: 1_0 102/68  Pulse: 72 67 72 65  Resp: _1 Temp: (!) 97.4 F (36.3 C) 98.3 F (36.8 C) 98.3 F (36.8 C) 97.7 F (36.5 C)  TempSrc:  Oral    SpO2: 96% 97% 91% 97%  Weight:      Height:        Intake/Output Summary (Last 24 hours) at 02/02/2022 0956 Last data filed at 02/02/2022 0547 Gross per 24 hour  Intake 960 ml  Output 1125 ml  Net -165 ml   Filed Weights   01/28/22 1242 01/30/22 1215  Weight: 108.4 kg 108.4 kg    Examination:  General exam: No acute distress.  Sitting up in chair Respiratory system: Lungs clear.  Normal work of breathing.  Room air Cardiovascular system: S1-S2, RRR, no murmurs, no pedal edema Gastrointestinal  system: Soft, NT/ND, normal bowel sounds Central nervous system: Alert and oriented. No focal neurological deficits. Extremities: Symmetric 5 x 5 power. Skin: Right foot in surgical dressing, dressing CDI Psychiatry: Judgement and insight appear normal. Mood & affect appropriate.     Data Reviewed: I have personally reviewed following labs and imaging studies  CBC: Recent Labs  Lab 01/28/22 1251 01/29/22 0506 02/02/22 0737  WBC 15.7* 10.6* 11.9*  NEUTROABS 12.6*  --  7.7  HGB 14.9 13.4 13.9  HCT 43.6 39.8 40.7  MCV 88.4 89.0 87.9  PLT 311 249 854   Basic Metabolic Panel: Recent Labs  Lab 01/28/22 1251 01/29/22 0506 01/31/22 0530 02/02/22 0737  NA 136 137  --  134*  K 3.9 3.4*  --  3.9  CL 98 100  --  103  CO2 28 27  --  23  GLUCOSE 154* 117*  --  107*  BUN 15 10  --  18  CREATININE 1.06 0.79 0.63 0.67  CALCIUM 9.9 9.4  --  9.4   GFR: Estimated Creatinine Clearance: 147.4 mL/min (by C-G formula based on SCr of 0.67 mg/dL). Liver Function Tests: Recent Labs  Lab 01/28/22 1251  AST 36  ALT 23  ALKPHOS 82  BILITOT 0.9  PROT 9.5*  ALBUMIN 3.9   No results for input(s): "LIPASE", "AMYLASE" in the last 168 hours. No results for input(s): "AMMONIA" in the last 168 hours. Coagulation Profile: Recent Labs  Lab 01/28/22 1251 01/29/22 0506  INR 1.1 1.1   Cardiac Enzymes: No results for input(s): "CKTOTAL", "CKMB", "CKMBINDEX", "TROPONINI" in the last 168 hours. BNP (last 3 results) No results for input(s): "PROBNP" in the last 8760 hours. HbA1C: No results for input(s): "HGBA1C" in the last 72 hours.  CBG: Recent Labs  Lab 02/01/22 0801 02/01/22 1205 02/01/22 1646 02/01/22 2017 02/02/22 0822  GLUCAP 118* 140* 99 133* 121*   Lipid Profile: No results for input(s): "CHOL", "HDL", "LDLCALC", "TRIG", "CHOLHDL", "LDLDIRECT" in the last 72 hours. Thyroid Function Tests: No results for input(s): "TSH", "T4TOTAL", "FREET4", "T3FREE", "THYROIDAB" in the  last 72 hours. Anemia Panel: No results for input(s): "VITAMINB12", "FOLATE", "FERRITIN", "TIBC", "IRON", "RETICCTPCT" in the last 72 hours. Sepsis Labs: Recent Labs  Lab 01/28/22 1251 01/28/22 1840 01/29/22 0506  PROCALCITON  --   --  <0.10  LATICACIDVEN 0.9 1.1  --     Recent Results (from the past 240 hour(s))  Culture, blood (Routine x 2)     Status: None (Preliminary result)   Collection Time: 01/28/22 12:51 PM   Specimen: BLOOD  Result Value Ref Range Status   Specimen Description   Final    BLOOD LEFT ANTECUBITAL Performed at [facility], [address] [place], [place] [address]    Special Requests   Final    BOTTLES DRAWN AEROBIC AND ANAEROBIC Blood Culture adequate volume Performed at [facility], [address], [place], [place] [address]    Culture  Setup Time   Final    GRAM POSITIVE COCCI ANAEROBIC BOTTLE ONLY CRITICAL RESULT CALLED TO, READ BACK BY AND VERIFIED WITH: WILL ANDERSON ON 02/01/22 AT 8182 QSD Performed at [facility], [place] [address], [place], [place] [address]    Culture  GRAM POSITIVE COCCI  Final   Report Status PENDING  Incomplete  Blood Culture ID Panel (Reflexed)     Status: None   Collection Time: 01/28/22 12:51 PM  Result Value Ref Range Status   Enterococcus faecalis NOT DETECTED NOT DETECTED Final   Enterococcus Faecium NOT DETECTED NOT DETECTED Final   Listeria monocytogenes NOT DETECTED NOT DETECTED Final   Staphylococcus species NOT DETECTED NOT DETECTED Final   Staphylococcus aureus (BCID) NOT DETECTED NOT DETECTED Final   Staphylococcus epidermidis NOT DETECTED NOT DETECTED Final   Staphylococcus lugdunensis NOT DETECTED NOT DETECTED Final   Streptococcus species NOT DETECTED NOT DETECTED Final   Streptococcus agalactiae NOT DETECTED NOT DETECTED Final   Streptococcus pneumoniae NOT DETECTED NOT DETECTED Final   Streptococcus pyogenes NOT DETECTED NOT DETECTED Final   A.calcoaceticus-baumannii NOT  DETECTED NOT DETECTED Final   Bacteroides fragilis NOT DETECTED NOT DETECTED Final   Enterobacterales NOT DETECTED NOT DETECTED Final   Enterobacter cloacae complex NOT DETECTED NOT DETECTED Final   Escherichia coli NOT DETECTED NOT DETECTED Final   Klebsiella aerogenes NOT DETECTED NOT DETECTED Final   Klebsiella oxytoca NOT DETECTED NOT DETECTED Final   Klebsiella pneumoniae NOT DETECTED NOT DETECTED Final   Proteus species NOT DETECTED NOT DETECTED Final   Salmonella species NOT DETECTED NOT DETECTED Final   Serratia marcescens NOT DETECTED NOT DETECTED Final   Haemophilus influenzae NOT DETECTED NOT DETECTED Final   Neisseria meningitidis NOT DETECTED NOT DETECTED Final   Pseudomonas aeruginosa NOT DETECTED NOT DETECTED Final   Stenotrophomonas maltophilia NOT DETECTED NOT DETECTED Final   Candida albicans NOT DETECTED NOT DETECTED Final   Candida auris NOT DETECTED NOT DETECTED Final   Candida glabrata NOT DETECTED NOT DETECTED Final   Candida krusei NOT DETECTED NOT DETECTED Final   Candida parapsilosis NOT DETECTED NOT DETECTED Final   Candida tropicalis NOT DETECTED NOT DETECTED Final   Cryptococcus neoformans/gattii NOT DETECTED NOT DETECTED Final    Comment: Performed at [facility], [address] [place], [place] [address]  Culture, blood (Routine x 2)     Status: None   Collection Time: 01/28/22  3:37 PM   Specimen: BLOOD  Result Value Ref Range Status   Specimen Description BLOOD BLOOD LEFT HAND  Final   Special Requests   Final    BOTTLES DRAWN AEROBIC AND ANAEROBIC Blood Culture results may not be optimal due to an inadequate volume of blood received in culture bottles   Culture   Final    NO GROWTH 5 DAYS Performed at [facility], [address] [place], [place] [address]    Report Status 02/02/2022 FINAL  Final  Surgical pcr screen     Status: None   Collection Time: 01/30/22  1:35 AM   Specimen: Nasal Mucosa; Nasal Swab  Result Value  Ref Range Status   MRSA, PCR NEGATIVE NEGATIVE Final   Staphylococcus aureus NEGATIVE NEGATIVE Final    Comment: (NOTE) The Xpert SA Assay (FDA approved for NASAL specimens in patients 39 years of age and older), is one component of a comprehensive surveillance program. It is not intended to diagnose infection nor to guide or monitor treatment. Performed at [facility], [address] [place], [place] [address]   Aerobic/Anaerobic Culture w Gram Stain (surgical/deep wound)     Status: None (Preliminary result)   Collection Time: 01/30/22  1:35 AM   Specimen: Foot; Wound  Result Value Ref Range Status   Specimen Description  Final    FOOT RIGTH Performed at [facility], [address] [place], [place] [address]    Special Requests   Final    NONE Performed at [facility], [place], [place] [address]    Gram Stain   Final    NO SQUAMOUS EPITHELIAL CELLS SEEN FEW WBC SEEN MODERATE GRAM POSITIVE COCCI    Culture   Final    FEW PROTEUS MIRABILIS HOLDING FOR POSSIBLE ANAEROBE Performed at [facility], [place] [address] [place], [place] [address]    Report Status PENDING  Incomplete   Organism ID, Bacteria PROTEUS MIRABILIS  Final      Susceptibility   Proteus mirabilis - MIC*    AMPICILLIN >=32 RESISTANT Resistant     CEFAZOLIN 8 SENSITIVE Sensitive     CEFEPIME <=0.12 SENSITIVE Sensitive     CEFTAZIDIME <=1 SENSITIVE Sensitive     CEFTRIAXONE <=0.25 SENSITIVE Sensitive     CIPROFLOXACIN <=0.25 SENSITIVE Sensitive     GENTAMICIN <=1 SENSITIVE Sensitive     IMIPENEM 2 SENSITIVE Sensitive     TRIMETH/SULFA >=320 RESISTANT Resistant     AMPICILLIN/SULBACTAM 16 INTERMEDIATE Intermediate     PIP/TAZO <=4 SENSITIVE Sensitive     * FEW PROTEUS MIRABILIS  Aerobic/Anaerobic Culture w Gram Stain (surgical/deep wound)     Status: None (Preliminary result)   Collection Time: 01/30/22  1:19 PM   Specimen: PATH Other; Tissue   Result Value Ref Range Status   Specimen Description   Final    WOUND Performed at [facility], [address], [place], [place] [address]    Special Requests   Final    RIGHT 5TH METATARSAL Performed at [facility], [address] [place], [place] [address]    Gram Stain   Final    FEW WBC PRESENT, PREDOMINANTLY PMN NO ORGANISMS SEEN Performed at [facility], [place] [address] [place],  [place] [address]    Culture   Final    RARE PROTEUS MIRABILIS SUSCEPTIBILITIES PERFORMED ON PREVIOUS CULTURE WITHIN THE LAST 5 DAYS. NO ANAEROBES ISOLATED; CULTURE IN PROGRESS FOR 5 DAYS    Report Status PENDING  Incomplete         Radiology Studies: No results found.      Scheduled Meds:  ALPRAZolam  0.25 mg Oral BID   amLODipine  10 mg Oral Daily   vitamin C  500 mg Oral BID   DULoxetine  20 mg Oral Daily   enoxaparin (LOVENOX) injection  55 mg Subcutaneous Q24H   hydrochlorothiazide  12.5 mg Oral Daily   insulin aspart  0-15 Units Subcutaneous TID WC   insulin aspart  0-5 Units Subcutaneous QHS   levETIRAcetam  500 mg Oral BID   linagliptin  5 mg Oral Daily   lisinopril  20 mg Oral Daily   metoprolol succinate  100 mg Oral Daily   multivitamin with minerals  1 tablet Oral Daily   nutrition supplement (JUVEN)  1 packet Oral BID BM   pantoprazole  40 mg Oral Daily   Ensure Max Protein  11 oz Oral QHS   zinc sulfate  220 mg Oral Daily   Continuous Infusions:  linezolid (ZYVOX) IV 600 mg (02/01/22 2118)   piperacillin-tazobactam (ZOSYN)  IV 3.375 g (02/02/22 0546)     LOS: 5 days      Sidney Ace, MD [facility]   If 7PM-7AM, please contact night-coverage  02/02/2022, 9:56 AM

## 2022-02-03 DIAGNOSIS — L0390 Cellulitis, unspecified: Secondary | ICD-10-CM | POA: Diagnosis not present

## 2022-02-03 DIAGNOSIS — A419 Sepsis, unspecified organism: Secondary | ICD-10-CM | POA: Diagnosis not present

## 2022-02-03 DIAGNOSIS — E1169 Type 2 diabetes mellitus with other specified complication: Secondary | ICD-10-CM | POA: Diagnosis not present

## 2022-02-03 DIAGNOSIS — L089 Local infection of the skin and subcutaneous tissue, unspecified: Secondary | ICD-10-CM | POA: Diagnosis not present

## 2022-02-03 DIAGNOSIS — E11628 Type 2 diabetes mellitus with other skin complications: Secondary | ICD-10-CM | POA: Diagnosis not present

## 2022-02-03 DIAGNOSIS — M86171 Other acute osteomyelitis, right ankle and foot: Secondary | ICD-10-CM | POA: Diagnosis not present

## 2022-02-03 LAB — GLUCOSE, CAPILLARY
Glucose-Capillary: 110 mg/dL — ABNORMAL HIGH (ref 70–99)
Glucose-Capillary: 147 mg/dL — ABNORMAL HIGH (ref 70–99)
Glucose-Capillary: 87 mg/dL (ref 70–99)
Glucose-Capillary: 85 mg/dL (ref 70–99)

## 2022-02-03 LAB — SURGICAL PATHOLOGY

## 2022-02-03 MED ORDER — ENOXAPARIN SODIUM 60 MG/0.6ML IJ SOSY
0.5000 mg/kg | PREFILLED_SYRINGE | INTRAMUSCULAR | Status: DC
  Administered 2022-02-03 – 2022-02-04 (×2): 55 mg via SUBCUTANEOUS
  Filled 2022-02-03 (×2): qty 0.6

## 2022-02-03 NOTE — Plan of Care (Signed)

## 2022-02-03 NOTE — Progress Notes (Signed)
[facility]  Daily Progress Note  Late entry  Subjective  -   Daniel Ritter underwent bilateral foot surgery on 01/30/2022.  The patient had noted medial calcification on x-ray and his ABIs were concerning for possible diminished flow on the right.  During the surgery it was noted that the patient had brisk bleeding in his left foot but diminished bleeding in the right.  Objective Vitals:   02/02/22 1654 02/02/22 2010 02/03/22 0534 02/03/22 0813  BP: 91/62 100/61 98/73 104/72  Pulse: 70 70 70 69  Resp: '16 20 18 16  '$ Temp: 97.8 F (36.6 C) 98.7 F (37.1 C) 98.3 F (36.8 C) 97.6 F (36.4 C)  TempSrc: Oral Oral    SpO2: 98% 97% 98% 99%  Weight:      Height:        Intake/Output Summary (Last 24 hours) at 02/03/2022 0926 Last data filed at 02/03/2022 1751 Gross per 24 hour  Intake 1909.65 ml  Output --  Net 1909.65 ml    PULM  CTAB CV  RRR VASC bilateral feet dressed with postoperative dressings.  Laboratory CBC    Component Value Date/Time   WBC 11.9 (H) 02/02/2022 0737   HGB 13.9 02/02/2022 0737   HGB 13.9 10/30/2011 1123   HCT 40.7 02/02/2022 0737   HCT 37.0 (L) 10/10/2011 1407   PLT 254 02/02/2022 0737   PLT 191 10/30/2011 1123    BMET    Component Value Date/Time   NA 134 (L) 02/02/2022 0737   NA 140 11/08/2014 0851   NA 136 09/20/2011 0727   K 3.9 02/02/2022 0737   K 4.0 10/10/2011 1407   CL 103 02/02/2022 0737   CL 103 09/20/2011 0727   CO2 23 02/02/2022 0737   CO2 21 09/20/2011 0727   GLUCOSE 107 (H) 02/02/2022 0737   GLUCOSE 153 (H) 09/20/2011 0727   BUN 18 02/02/2022 0737   BUN 13 11/08/2014 0851   BUN 11 09/20/2011 0727   CREATININE 0.67 02/02/2022 0737   CREATININE 0.75 10/10/2011 1407   CALCIUM 9.4 02/02/2022 0737   CALCIUM 9.2 09/20/2011 0727   GFRNONAA >60 02/02/2022 0737   GFRNONAA >60 10/10/2011 1407   GFRAA >60 12/12/2015 1540   GFRAA >60 10/10/2011 1407    Assessment/Planning: Peripheral arterial  disease  Based on the ABIs suggesting diminished flow as well as the confirmation by decreased flow noted during surgery, it would be in the patient's best interest to undergo a right lower extremity angiogram.  We discussed risk, benefits and alternatives.  This is to allow his right lower extremity wound to heal and to attempt to prevent further amputation.  The patient has eaten today and we are unfortunately unable to do his angiogram today.  We will be able to do his angiogram next week on Tuesday, however he has other critical things that he needs to take care of that require him to leave the hospital.  Based on this we will plan on having the patient return on an outpatient basis for right lower extremity angiogram. Daniel Ritter  02/03/2022, 9:26 AM

## 2022-02-03 NOTE — Plan of Care (Signed)
  Problem: Fluid Volume: Goal: Hemodynamic stability will improve Outcome: Progressing   Problem: Clinical Measurements: Goal: Diagnostic test results will improve Outcome: Progressing Goal: Signs and symptoms of infection will decrease Outcome: Progressing   Problem: Respiratory: Goal: Ability to maintain adequate ventilation will improve Outcome: Progressing   Problem: Education: Goal: Knowledge of General Education information will improve Description: Including pain rating scale, medication(s)/side effects and non-pharmacologic comfort measures Outcome: Progressing   Problem: Health Behavior/Discharge Planning: Goal: Ability to manage health-related needs will improve Outcome: Progressing   Problem: Clinical Measurements: Goal: Ability to maintain clinical measurements within normal limits will improve Outcome: Progressing Goal: Will remain free from infection Outcome: Progressing Goal: Diagnostic test results will improve Outcome: Progressing Goal: Respiratory complications will improve Outcome: Progressing Goal: Cardiovascular complication will be avoided Outcome: Progressing   Problem: Activity: Goal: Risk for activity intolerance will decrease Outcome: Progressing   Problem: Nutrition: Goal: Adequate nutrition will be maintained Outcome: Progressing   Problem: Coping: Goal: Level of anxiety will decrease Outcome: Progressing   Problem: Elimination: Goal: Will not experience complications related to bowel motility Outcome: Progressing Goal: Will not experience complications related to urinary retention Outcome: Progressing   Problem: Pain Managment: Goal: General experience of comfort will improve Outcome: Progressing   Problem: Safety: Goal: Ability to remain free from injury will improve Outcome: Progressing   Problem: Skin Integrity: Goal: Risk for impaired skin integrity will decrease Outcome: Progressing   Problem: Education: Goal: Ability  to describe self-care measures that may prevent or decrease complications (Diabetes Survival Skills Education) will improve Outcome: Progressing   Problem: Coping: Goal: Ability to adjust to condition or change in health will improve Outcome: Progressing   Problem: Fluid Volume: Goal: Ability to maintain a balanced intake and output will improve Outcome: Progressing   Problem: Health Behavior/Discharge Planning: Goal: Ability to identify and utilize available resources and services will improve Outcome: Progressing Goal: Ability to manage health-related needs will improve Outcome: Progressing   Problem: Metabolic: Goal: Ability to maintain appropriate glucose levels will improve Outcome: Progressing   Problem: Nutritional: Goal: Maintenance of adequate nutrition will improve Outcome: Progressing Goal: Progress toward achieving an optimal weight will improve Outcome: Progressing   Problem: Skin Integrity: Goal: Risk for impaired skin integrity will decrease Outcome: Progressing   Problem: Tissue Perfusion: Goal: Adequacy of tissue perfusion will improve Outcome: Progressing

## 2022-02-03 NOTE — Progress Notes (Signed)
PROGRESS NOTE    PER BEAGLEY  MQK:[id number] DOB: [birth year] DOA: 01/28/2022 PCP: Theotis Burrow, MD    Brief Narrative:   46 y.o. Caucasian male with medical history significant for anxiety, type diabetes mellitus, GERD, hypertension, seizure disorder, peripheral neuropathy and pancreatitis, presented to the emergency room with acute onset of worsening right foot swelling with erythema as well as deteriorating ulcer on the right fifth MTP which has been there for a couple of months.  Patient thought it was initially healing and later on noticed worsening redness and purulent discharge since Wednesday.  He also noticed a black bump per his report.  Since then erythematous been extending to his left foot dorsum.  He has a scab in his left first web space and plantar ulcer on the left foot which has not significantly worsened.  He denies any chest pain or dyspnea or cough.  No nausea or vomiting or abdominal pain.  No fever or chills.  No dysuria, oliguria or hematuria or flank pain.  MRI confirmed septic arthritis with associated osteomyelitis.  Seen by podiatry.  Status post surgical resection 7/27.  Surgical cultures and pathology pending   Assessment & Plan:   Principal Problem:   Sepsis due to cellulitis [facility]) Active Problems:   Acute osteomyelitis of ankle or foot, right (HCC)   Anxiety and depression   Essential hypertension   Seizures (HCC)   Type 2 diabetes mellitus with peripheral neuropathy (HCC)   Malnutrition of moderate degree  Acute osteomyelitis and septic arthritis of right foot Overlying cellulitis Sepsis secondary to above Sepsis physiology criteria met by tachycardia leukocytosis MRI confirms osteomyelitis with associated septic arthritis Wound with minimal drainage, tender to touch Status post surgical resection 7/27, tolerated procedure well Plan: Continue current broad-spectrum antibiotics Anticipate will need long-term IV antibiotics ID on board   Surgical cultures positive for Proteus mirabilis, Staphylococcus aureus Follow pathology, pending Continue therapy evaluations Multimodal pain control, pain well controlled  Type 2 diabetes mellitus with peripheral neuropathy Hemoglobin A1c 5.7, well controlled Plan: Continue sliding-scale coverage carb modified diet Continue Tradjenta  Anxiety and depression PTA Xanax and Cymbalta  History of seizure disorder PTA Keppra  Essential hypertension BP controlled Continue PTA regimen of Toprol-XL, lisinopril, amlodipine, hydrochlorothiazide Vital per unit protocol  GERD Continue PPI    DVT prophylaxis: SQ Lovenox Code Status: Full Family Communication: Father at bedside 7/27 Disposition Plan: Status is: Inpatient Remains inpatient appropriate because: Acute osteomyelitis of right foot.  Postoperative day # 4 status post surgical resection.  Pain well controlled  Level of care: Med-Surg  Consultants:  Podiatry  Procedures:  Surgical resection right foot wound with partial ray amputation 7/27  Antimicrobials: Zosyn Zyvox   Subjective: Seen and examined.  No pain.  Resting comfortably in bed  Objective: Vitals:   02/02/22 1654 02/02/22 2010 02/03/22 0534 02/03/22 0813  BP: 91/62 100/61 98/73 104/72  Pulse: 70 70 70 69  Resp: 16 20 18 16   Temp: 97.8 F (36.6 C) 98.7 F (37.1 C) 98.3 F (36.8 C) 97.6 F (36.4 C)  TempSrc: Oral Oral    SpO2: 98% 97% 98% 99%  Weight:      Height:        Intake/Output Summary (Last 24 hours) at 02/03/2022 1044 Last data filed at 02/03/2022 1022 Gross per 24 hour  Intake 2389.65 ml  Output --  Net 2389.65 ml   Filed Weights   01/28/22 1242 01/30/22 1215  Weight: 108.4 kg 108.4 kg  Examination:  General exam: No acute distress, resting comfortably Respiratory system: Lungs clear.  Normal work of breathing.  Room air Cardiovascular system: S1-S2, RRR, no murmurs, no pedal edema Gastrointestinal system: Soft, NT/ND,  normal bowel sounds Central nervous system: Alert and oriented. No focal neurological deficits. Extremities: Symmetric 5 x 5 power. Skin: Right foot in surgical dressing, dressing CDI Psychiatry: Judgement and insight appear normal. Mood & affect appropriate.     Data Reviewed: I have personally reviewed following labs and imaging studies  CBC: Recent Labs  Lab 01/28/22 1251 01/29/22 0506 02/02/22 0737  WBC 15.7* 10.6* 11.9*  NEUTROABS 12.6*  --  7.7  HGB 14.9 13.4 13.9  HCT 43.6 39.8 40.7  MCV 88.4 89.0 87.9  PLT 311 249 808   Basic Metabolic Panel: Recent Labs  Lab 01/28/22 1251 01/29/22 0506 01/31/22 0530 02/02/22 0737  NA 136 137  --  134*  K 3.9 3.4*  --  3.9  CL 98 100  --  103  CO2 28 27  --  23  GLUCOSE 154* 117*  --  107*  BUN 15 10  --  18  CREATININE 1.06 0.79 0.63 0.67  CALCIUM 9.9 9.4  --  9.4   GFR: Estimated Creatinine Clearance: 147.4 mL/min (by C-G formula based on SCr of 0.67 mg/dL). Liver Function Tests: Recent Labs  Lab 01/28/22 1251  AST 36  ALT 23  ALKPHOS 82  BILITOT 0.9  PROT 9.5*  ALBUMIN 3.9   No results for input(s): "LIPASE", "AMYLASE" in the last 168 hours. No results for input(s): "AMMONIA" in the last 168 hours. Coagulation Profile: Recent Labs  Lab 01/28/22 1251 01/29/22 0506  INR 1.1 1.1   Cardiac Enzymes: No results for input(s): "CKTOTAL", "CKMB", "CKMBINDEX", "TROPONINI" in the last 168 hours. BNP (last 3 results) No results for input(s): "PROBNP" in the last 8760 hours. HbA1C: No results for input(s): "HGBA1C" in the last 72 hours.  CBG: Recent Labs  Lab 02/02/22 0822 02/02/22 1157 02/02/22 1623 02/02/22 2018 02/03/22 0808  GLUCAP 121* 119* 106* 104* 110*   Lipid Profile: No results for input(s): "CHOL", "HDL", "LDLCALC", "TRIG", "CHOLHDL", "LDLDIRECT" in the last 72 hours. Thyroid Function Tests: No results for input(s): "TSH", "T4TOTAL", "FREET4", "T3FREE", "THYROIDAB" in the last 72 hours. Anemia  Panel: No results for input(s): "VITAMINB12", "FOLATE", "FERRITIN", "TIBC", "IRON", "RETICCTPCT" in the last 72 hours. Sepsis Labs: Recent Labs  Lab 01/28/22 1251 01/28/22 1840 01/29/22 0506  PROCALCITON  --   --  <0.10  LATICACIDVEN 0.9 1.1  --     Recent Results (from the past 240 hour(s))  Culture, blood (Routine x 2)     Status: None (Preliminary result)   Collection Time: 01/28/22 12:51 PM   Specimen: BLOOD  Result Value Ref Range Status   Specimen Description   Final    BLOOD LEFT ANTECUBITAL Performed at [facility], [address] [place], [place] [address]    Special Requests   Final    BOTTLES DRAWN AEROBIC AND ANAEROBIC Blood Culture adequate volume Performed at [facility], [address], [place], [place] [address]    Culture  Setup Time   Final    GRAM POSITIVE COCCI ANAEROBIC BOTTLE ONLY CRITICAL RESULT CALLED TO, READ BACK BY AND VERIFIED WITH: WILL ANDERSON ON 02/01/22 AT 8592 QSD Performed at [facility], [place] [address], [place], [place] [address]    Culture GRAM POSITIVE COCCI  Final   Report Status PENDING  Incomplete  Blood Culture ID Panel (Reflexed)     Status: None   Collection Time: 01/28/22 12:51 PM  Result Value Ref Range Status   Enterococcus faecalis NOT DETECTED NOT DETECTED Final   Enterococcus Faecium NOT DETECTED NOT DETECTED Final   Listeria monocytogenes NOT DETECTED NOT DETECTED Final   Staphylococcus species NOT DETECTED NOT DETECTED Final   Staphylococcus aureus (BCID) NOT DETECTED NOT DETECTED Final   Staphylococcus epidermidis NOT DETECTED NOT DETECTED Final   Staphylococcus lugdunensis NOT DETECTED NOT DETECTED Final   Streptococcus species NOT DETECTED NOT DETECTED Final   Streptococcus agalactiae NOT DETECTED NOT DETECTED Final   Streptococcus pneumoniae NOT DETECTED NOT DETECTED Final   Streptococcus pyogenes NOT DETECTED NOT DETECTED Final   A.calcoaceticus-baumannii NOT DETECTED NOT DETECTED  Final   Bacteroides fragilis NOT DETECTED NOT DETECTED Final   Enterobacterales NOT DETECTED NOT DETECTED Final   Enterobacter cloacae complex NOT DETECTED NOT DETECTED Final   Escherichia coli NOT DETECTED NOT DETECTED Final   Klebsiella aerogenes NOT DETECTED NOT DETECTED Final   Klebsiella oxytoca NOT DETECTED NOT DETECTED Final   Klebsiella pneumoniae NOT DETECTED NOT DETECTED Final   Proteus species NOT DETECTED NOT DETECTED Final   Salmonella species NOT DETECTED NOT DETECTED Final   Serratia marcescens NOT DETECTED NOT DETECTED Final   Haemophilus influenzae NOT DETECTED NOT DETECTED Final   Neisseria meningitidis NOT DETECTED NOT DETECTED Final   Pseudomonas aeruginosa NOT DETECTED NOT DETECTED Final   Stenotrophomonas maltophilia NOT DETECTED NOT DETECTED Final   Candida albicans NOT DETECTED NOT DETECTED Final   Candida auris NOT DETECTED NOT DETECTED Final   Candida glabrata NOT DETECTED NOT DETECTED Final   Candida krusei NOT DETECTED NOT DETECTED Final   Candida parapsilosis NOT DETECTED NOT DETECTED Final   Candida tropicalis NOT DETECTED NOT DETECTED Final   Cryptococcus neoformans/gattii NOT DETECTED NOT DETECTED Final    Comment: Performed at [facility], [address] [place], [place] [address]  Culture, blood (Routine x 2)     Status: None   Collection Time: 01/28/22  3:37 PM   Specimen: BLOOD  Result Value Ref Range Status   Specimen Description BLOOD BLOOD LEFT HAND  Final   Special Requests   Final    BOTTLES DRAWN AEROBIC AND ANAEROBIC Blood Culture results may not be optimal due to an inadequate volume of blood received in culture bottles   Culture   Final    NO GROWTH 5 DAYS Performed at [facility], [address] [place], [place] [address]    Report Status 02/02/2022 FINAL  Final  Surgical pcr screen     Status: None   Collection Time: 01/30/22  1:35 AM   Specimen: Nasal Mucosa; Nasal Swab  Result Value Ref Range Status    MRSA, PCR NEGATIVE NEGATIVE Final   Staphylococcus aureus NEGATIVE NEGATIVE Final    Comment: (NOTE) The Xpert SA Assay (FDA approved for NASAL specimens in patients 34 years of age and older), is one component of a comprehensive surveillance program. It is not intended to diagnose infection nor to guide or monitor treatment. Performed at [facility], [address] [place], [place] [address]   Aerobic/Anaerobic Culture w Gram Stain (surgical/deep wound)     Status: None (Preliminary result)   Collection Time: 01/30/22  1:35 AM   Specimen: Foot; Wound  Result Value Ref Range Status   Specimen Description   Final    FOOT RIGTH Performed at [facility], [address] [place], [place] [address]    Special Requests   Final    NONE Performed at [facility], [address] [place], [place] [address]    Gram Stain   Final    NO SQUAMOUS EPITHELIAL CELLS SEEN FEW WBC SEEN MODERATE GRAM POSITIVE COCCI    Culture   Final    FEW PROTEUS MIRABILIS FEW STAPHYLOCOCCUS AUREUS SUSCEPTIBILITIES TO FOLLOW NO ANAEROBES ISOLATED Performed at [facility], [address] [place], [place] [address]    Report Status PENDING  Incomplete   Organism ID, Bacteria PROTEUS MIRABILIS  Final      Susceptibility   Proteus mirabilis - MIC*    AMPICILLIN >=32 RESISTANT Resistant     CEFAZOLIN 8 SENSITIVE Sensitive     CEFEPIME <=0.12 SENSITIVE Sensitive     CEFTAZIDIME <=1 SENSITIVE Sensitive     CEFTRIAXONE <=0.25 SENSITIVE Sensitive     CIPROFLOXACIN <=0.25 SENSITIVE Sensitive     GENTAMICIN <=1 SENSITIVE Sensitive     IMIPENEM 2 SENSITIVE Sensitive     TRIMETH/SULFA >=320 RESISTANT Resistant     AMPICILLIN/SULBACTAM 16 INTERMEDIATE Intermediate     PIP/TAZO <=4 SENSITIVE Sensitive     * FEW PROTEUS MIRABILIS  Aerobic/Anaerobic Culture w Gram Stain (surgical/deep wound)     Status: None (Preliminary result)   Collection Time: 01/30/22  1:19 PM    Specimen: PATH Other; Tissue  Result Value Ref Range Status   Specimen Description   Final    WOUND Performed at [facility], [address] [place], [place] [address]    Special Requests   Final    RIGHT 5TH METATARSAL Performed at [facility], [address] [place], [place] [address]    Gram Stain   Final    FEW WBC PRESENT, PREDOMINANTLY PMN NO ORGANISMS SEEN Performed at [facility], [place] [address], [place], [place] [address]    Culture   Final    RARE PROTEUS MIRABILIS SUSCEPTIBILITIES PERFORMED ON PREVIOUS CULTURE WITHIN THE LAST 5 DAYS. NO ANAEROBES ISOLATED; CULTURE IN PROGRESS FOR 5 DAYS    Report Status PENDING  Incomplete         Radiology Studies: No results found.      Scheduled Meds:  ALPRAZolam  0.25 mg Oral BID   amLODipine  10 mg Oral Daily   vitamin C  500 mg Oral BID   DULoxetine  20 mg Oral Daily   enoxaparin (LOVENOX) injection  55 mg Subcutaneous Q24H   hydrochlorothiazide  12.5 mg Oral Daily   insulin aspart  0-15 Units Subcutaneous TID WC   insulin aspart  0-5 Units Subcutaneous QHS   levETIRAcetam  500 mg Oral BID   linagliptin  5 mg Oral Daily   lisinopril  20 mg Oral Daily   metoprolol succinate  100 mg Oral Daily   multivitamin with minerals  1 tablet Oral Daily   nutrition supplement (JUVEN)  1 packet Oral BID BM   pantoprazole  40 mg Oral Daily   Ensure Max Protein  11 oz Oral QHS   zinc sulfate  220 mg Oral Daily   Continuous Infusions:  linezolid (ZYVOX) IV 600 mg (02/03/22 1022)   piperacillin-tazobactam (ZOSYN)  IV 12.5 mL/hr at 02/03/22 3832     LOS: 6 days      Sidney Ace, MD [facility]   If 7PM-7AM, please contact night-coverage  02/03/2022, 10:44 AM

## 2022-02-03 NOTE — Progress Notes (Signed)
Physical Therapy Treatment Patient Details Name: Daniel Ritter MRN: [id number] DOB: 1976-07-07 Today's Date: 02/03/2022   History of Present Illness Pt is a 46 y.o. male presenting to hospital 7/25 with foot wounds.  Pt admitted with sepsis d/t cellulitis and acute osteomyelitis of R ankle/foot.  S/p 7/27: Excision second metatarsophalangeal joint left foot; Partial fifth ray amputation right foot; Full-thickness excisional ulcer debridement to tendon plantar left foot; Full-thickness ulcer debridement to subcutaneous tissue plantar right foot.  PMH includes DM, CVA, htn, neuropathy, anxiety, seizures, cytotoxic brain edema, ICH, and ACL repair L.    PT Comments    Pt resting in bed upon PT arrival; agreeable to PT session; pt's dad present during session.  Tolerated B LE ex's in bed well (reviewed issued written HEP and also added B LE SAQ's).  Pt's B post op shoes appearing to be too long for pt so new post op shoes obtained which appear to be appropriate fit now.  During session pt modified independent semi-supine to sitting edge of bed; SBA with transfers; and SBA with w/c propulsion 550 feet with B UE's (focused also on turns, navigating tight spaces, managing brakes, and managing legrests--pt would benefit from continued review of these).  Pt requiring cueing for safely lining up manual w/c to recliner for transfer.  Will continue to focus on manual w/c education and strengthening during hospitalization.   Recommendations for follow up therapy are one component of a multi-disciplinary discharge planning process, led by the attending physician.  Recommendations may be updated based on patient status, additional functional criteria and insurance authorization.  Follow Up Recommendations  Follow physician's recommendations for discharge plan and follow up therapies     Assistance Recommended at Discharge Set up Supervision/Assistance  Patient can return home with the following A little help  with walking and/or transfers;A little help with bathing/dressing/bathroom;Assistance with cooking/housework;Assist for transportation;Help with stairs or ramp for entrance   Equipment Recommendations  BSC/3in1;Wheelchair (measurements PT);Wheelchair cushion (measurements PT)    Recommendations for Other Services       Precautions / Restrictions Precautions Precautions: Fall Precaution Comments: B post op shoes Restrictions Weight Bearing Restrictions: Yes RLE Weight Bearing: Partial weight bearing LLE Weight Bearing: Partial weight bearing Other Position/Activity Restrictions: Heel contact and surgical shoes B LE's; Heel WB for transfers only (pt can WB slightly more on L than R foot)     Mobility  Bed Mobility Overal bed mobility: Modified Independent             General bed mobility comments: Supine to sitting edge of bed--no difficulties noted    Transfers Overall transfer level: Needs assistance Equipment used: None Transfers: Bed to chair/wheelchair/BSC       Squat pivot transfers: Supervision     General transfer comment: squat pivot bed to manual w/c towards R side and then manual w/c to recliner towards L side; occasional vc's for WB'ing precautions and overall technique; vc's for lining up w/c for transfer    Ambulation/Gait               General Gait Details: Not appropriate at this time--transfers only per orders and MD Kaleen Mask mobility: Yes Wheelchair propulsion: Both upper extremities Wheelchair parts: Supervision/cueing Distance: [place] Details (indicate cue type and reason): occasional vc's for overall technique for propelling w/c, turns (especially in tight spaces), brake use, and leg rest  use  Modified Rankin (Stroke Patients Only)       Balance Overall balance assessment: Needs assistance Sitting-balance support: No upper extremity  supported, Feet unsupported Sitting balance-Leahy Scale: Good Sitting balance - Comments: steady sitting reaching within BOS                                    Cognition Arousal/Alertness: Awake/alert Behavior During Therapy: WFL for tasks assessed/performed Overall Cognitive Status: Within Functional Limits for tasks assessed                                          Exercises General Exercises - Lower Extremity Ankle Circles/Pumps: AROM, Strengthening, Both, 10 reps, Supine Short Arc Quad: AROM, Strengthening, Both, 10 reps, Supine Heel Slides: AROM, Strengthening, Both, 10 reps, Supine Straight Leg Raises: AROM, Strengthening, Both, 10 reps, Supine    General Comments General comments (skin integrity, edema, etc.): B LE dressings in place; no drainage noted.      Pertinent Vitals/Pain Pain Assessment Pain Assessment: 0-10 Pain Score: 3  Pain Location: R>L feet Pain Descriptors / Indicators: Sore Pain Intervention(s): Limited activity within patient's tolerance, Monitored during session, Premedicated before session, Repositioned    Home Living                          Prior Function            PT Goals (current goals can now be found in the care plan section) Acute Rehab PT Goals Patient Stated Goal: to go home and improve wounds PT Goal Formulation: With patient Time For Goal Achievement: 02/14/22 Potential to Achieve Goals: Good Additional Goals Additional Goal #1: Pt able to propel manual w/c >150 feet modified independently for safe mobility within home. Progress towards PT goals: Progressing toward goals    Frequency    7X/week      PT Plan Current plan remains appropriate    Co-evaluation              AM-PAC PT "6 Clicks" Mobility   Outcome Measure  Help needed turning from your back to your side while in a flat bed without using bedrails?: None Help needed moving from lying on your back to sitting  on the side of a flat bed without using bedrails?: None Help needed moving to and from a bed to a chair (including a wheelchair)?: A Little Help needed standing up from a chair using your arms (e.g., wheelchair or bedside chair)?: A Little Help needed to walk in hospital room?: Total Help needed climbing 3-5 steps with a railing? : Total 6 Click Score: 16    End of Session Equipment Utilized During Treatment: Other (comment) (B post op shoes) Activity Tolerance: Patient tolerated treatment well Patient left: in chair;with call bell/phone within reach;with chair alarm set;Other (comment) (B heels floating via pillow support) Nurse Communication: Mobility status;Precautions;Weight bearing status PT Visit Diagnosis: Other abnormalities of gait and mobility (R26.89);Muscle weakness (generalized) (M62.81);Pain Pain - Right/Left: Right (Left) Pain - part of body: Ankle and joints of foot     Time: 1135-1220 PT Time Calculation (min) (ACUTE ONLY): 45 min  Charges:  $Therapeutic Exercise: 8-22 mins $Therapeutic Activity: 8-22 mins $Wheel Chair Management: 8-22 mins  Leitha Bleak, PT 02/03/22, 1:28 PM

## 2022-02-03 NOTE — Progress Notes (Signed)
   Date of Admission:  01/28/2022     ID: Daniel Ritter is a 46 y.o. male Principal Problem:   Sepsis due to cellulitis [facility]) Active Problems:   Essential hypertension   Seizures (HCC)   Acute osteomyelitis of ankle or foot, right (HCC)   Type 2 diabetes mellitus with peripheral neuropathy (HCC)   Anxiety and depression   Malnutrition of moderate degree    Subjective: Doing better Awaiting angio  Medications:   ALPRAZolam  0.25 mg Oral BID   amLODipine  10 mg Oral Daily   vitamin C  500 mg Oral BID   DULoxetine  20 mg Oral Daily   enoxaparin (LOVENOX) injection  0.5 mg/kg Subcutaneous Q24H   hydrochlorothiazide  12.5 mg Oral Daily   insulin aspart  0-15 Units Subcutaneous TID WC   insulin aspart  0-5 Units Subcutaneous QHS   levETIRAcetam  500 mg Oral BID   linagliptin  5 mg Oral Daily   lisinopril  20 mg Oral Daily   metoprolol succinate  100 mg Oral Daily   multivitamin with minerals  1 tablet Oral Daily   nutrition supplement (JUVEN)  1 packet Oral BID BM   pantoprazole  40 mg Oral Daily   Ensure Max Protein  11 oz Oral QHS   zinc sulfate  220 mg Oral Daily    Objective: Vital signs in last 24 hours: Temp:  [97.6 F (36.4 C)-98.7 F (37.1 C)] 97.6 F (36.4 C) (07/31 0813) Pulse Rate:  [69-70] 69 (07/31 0813) Resp:  [16-20] 16 (07/31 0813) BP: (91-104)/(61-73) 104/72 (07/31 0813) SpO2:  [97 %-99 %] 99 % (07/31 0813)   PHYSICAL EXAM:  General: Alert, cooperative, no distress, appears stated age.  Head: Normocephalic, without obvious abnormality, atraumatic. Eyes: Conjunctivae clear, anicteric sclerae. Pupils are equal ENT Nares normal. No drainage or sinus tenderness. Lips, mucosa, and tongue normal. No Thrush Neck: Supple, symmetrical, no adenopathy, thyroid: non tender no carotid bruit and no JVD. Back: No CVA tenderness. Lungs: Clear to auscultation bilaterally. No Wheezing or Rhonchi. No rales. Heart: Regular rate and rhythm, no murmur, rub or  gallop. Abdomen: Soft, non-tender,not distended. Bowel sounds normal. No masses Extremities: rt foot- 5th ray excision- surgical site looks good     Left 2 nd toe- looking better- bluish discoloration of the tip has resolved     Skin: No rashes or lesions. Or bruising Lymph: Cervical, supraclavicular normal. Neurologic: Grossly non-focal  Lab Results Recent Labs    02/02/22 0737  WBC 11.9*  HGB 13.9  HCT 40.7  NA 134*  K 3.9  CL 103  CO2 23  BUN 18  CREATININE 0.67    Microbiology: Rt foot wound culture proteus    Assessment/Plan: Diabetic foot infection b/l feet Rt 5th toe ray excision MSSA and proteus in superficial culture and proteus in deep culture Left 2nd toe- no culture dent from ulcer Will do one today Pt currently on zosyn and linezolid- if MSSA then can switch to cefazolin _+ flagyl Because of acute osteomyelitis he will need IV for 4 weeks  Awaiting angio to assess circualtion   HTN on amlodipine, HCTZ and lisinopril and metoprolol  DM on insulin Discussed the management with the patient and care team

## 2022-02-04 ENCOUNTER — Inpatient Hospital Stay: Payer: Self-pay

## 2022-02-04 ENCOUNTER — Encounter
Admission: EM | Disposition: A | Discharge status: Home Health | Payer: Self-pay | Source: Home / Self Care | Attending: Internal Medicine

## 2022-02-04 DIAGNOSIS — A419 Sepsis, unspecified organism: Secondary | ICD-10-CM | POA: Diagnosis not present

## 2022-02-04 DIAGNOSIS — I700 Atherosclerosis of aorta: Secondary | ICD-10-CM

## 2022-02-04 DIAGNOSIS — I70235 Atherosclerosis of native arteries of right leg with ulceration of other part of foot: Secondary | ICD-10-CM

## 2022-02-04 DIAGNOSIS — L089 Local infection of the skin and subcutaneous tissue, unspecified: Secondary | ICD-10-CM | POA: Diagnosis not present

## 2022-02-04 DIAGNOSIS — Z9889 Other specified postprocedural states: Secondary | ICD-10-CM

## 2022-02-04 DIAGNOSIS — E1169 Type 2 diabetes mellitus with other specified complication: Secondary | ICD-10-CM | POA: Diagnosis not present

## 2022-02-04 DIAGNOSIS — L0390 Cellulitis, unspecified: Secondary | ICD-10-CM | POA: Diagnosis not present

## 2022-02-04 DIAGNOSIS — R7881 Bacteremia: Secondary | ICD-10-CM

## 2022-02-04 DIAGNOSIS — L97519 Non-pressure chronic ulcer of other part of right foot with unspecified severity: Secondary | ICD-10-CM

## 2022-02-04 DIAGNOSIS — E11628 Type 2 diabetes mellitus with other skin complications: Secondary | ICD-10-CM | POA: Diagnosis not present

## 2022-02-04 HISTORY — PX: LOWER EXTREMITY ANGIOGRAPHY: CATH118251

## 2022-02-04 LAB — AEROBIC/ANAEROBIC CULTURE W GRAM STAIN (SURGICAL/DEEP WOUND): Gram Stain: NONE SEEN

## 2022-02-04 LAB — GLUCOSE, CAPILLARY
Glucose-Capillary: 90 mg/dL (ref 70–99)
Glucose-Capillary: 139 mg/dL — ABNORMAL HIGH (ref 70–99)
Glucose-Capillary: 92 mg/dL (ref 70–99)
Glucose-Capillary: 88 mg/dL (ref 70–99)
Glucose-Capillary: 88 mg/dL (ref 70–99)
Glucose-Capillary: 127 mg/dL — ABNORMAL HIGH (ref 70–99)

## 2022-02-04 SURGERY — LOWER EXTREMITY ANGIOGRAPHY
Anesthesia: Moderate Sedation | Laterality: Right

## 2022-02-04 MED ORDER — HYDROMORPHONE HCL 1 MG/ML IJ SOLN: 1.0000 mg | Freq: Once | INTRAMUSCULAR | Status: DC | PRN

## 2022-02-04 MED ORDER — OXYCODONE HCL 5 MG PO TABS: 5.0000 mg | ORAL_TABLET | ORAL | Status: DC | PRN

## 2022-02-04 MED ORDER — SODIUM CHLORIDE 0.9 % IV SOLN
INTRAVENOUS | Status: DC
Start: 2022-02-04 — End: 2022-02-05

## 2022-02-04 MED ORDER — HEPARIN SODIUM (PORCINE) 1000 UNIT/ML IJ SOLN
INTRAMUSCULAR | Status: AC
  Filled 2022-02-04: qty 10

## 2022-02-04 MED ORDER — CEFAZOLIN SODIUM-DEXTROSE 2-4 GM/100ML-% IV SOLN: 2.0000 g | INTRAVENOUS | Status: DC

## 2022-02-04 MED ORDER — MIDAZOLAM HCL 2 MG/2ML IJ SOLN
INTRAMUSCULAR | Status: DC | PRN
  Administered 2022-02-04: .5 mg via INTRAVENOUS
  Administered 2022-02-04: 2 mg via INTRAVENOUS
  Administered 2022-02-04: 1 mg via INTRAVENOUS

## 2022-02-04 MED ORDER — DIPHENHYDRAMINE HCL 50 MG/ML IJ SOLN: 50.0000 mg | Freq: Once | INTRAMUSCULAR | Status: DC | PRN

## 2022-02-04 MED ORDER — SODIUM CHLORIDE 0.9% FLUSH: 3.0000 mL | INTRAVENOUS | Status: DC | PRN

## 2022-02-04 MED ORDER — MIDAZOLAM HCL 2 MG/2ML IJ SOLN
INTRAMUSCULAR | Status: AC
  Filled 2022-02-04: qty 4

## 2022-02-04 MED ORDER — IODIXANOL 320 MG/ML IV SOLN
INTRAVENOUS | Status: DC | PRN
  Administered 2022-02-04: 60 mL

## 2022-02-04 MED ORDER — FENTANYL CITRATE (PF) 100 MCG/2ML IJ SOLN
INTRAMUSCULAR | Status: DC | PRN
  Administered 2022-02-04: 50 ug via INTRAVENOUS
  Administered 2022-02-04 (×2): 25 ug via INTRAVENOUS

## 2022-02-04 MED ORDER — SODIUM CHLORIDE 0.9 % IV SOLN: 250.0000 mL | INTRAVENOUS | Status: DC | PRN

## 2022-02-04 MED ORDER — METHYLPREDNISOLONE SODIUM SUCC 125 MG IJ SOLR: 125.0000 mg | Freq: Once | INTRAMUSCULAR | Status: DC | PRN

## 2022-02-04 MED ORDER — FENTANYL CITRATE PF 50 MCG/ML IJ SOSY
PREFILLED_SYRINGE | INTRAMUSCULAR | Status: AC
  Filled 2022-02-04: qty 2

## 2022-02-04 MED ORDER — LABETALOL HCL 5 MG/ML IV SOLN: 10.0000 mg | INTRAVENOUS | Status: DC | PRN

## 2022-02-04 MED ORDER — ACETAMINOPHEN 325 MG PO TABS: 650.0000 mg | ORAL_TABLET | ORAL | Status: DC | PRN

## 2022-02-04 MED ORDER — SODIUM CHLORIDE 0.9% FLUSH
3.0000 mL | Freq: Two times a day (BID) | INTRAVENOUS | Status: DC
  Administered 2022-02-04 – 2022-02-05 (×2): 3 mL via INTRAVENOUS

## 2022-02-04 MED ORDER — FAMOTIDINE 20 MG PO TABS: 40.0000 mg | ORAL_TABLET | Freq: Once | ORAL | Status: DC | PRN

## 2022-02-04 MED ORDER — MORPHINE SULFATE (PF) 2 MG/ML IV SOLN: 2.0000 mg | INTRAVENOUS | Status: DC | PRN

## 2022-02-04 MED ORDER — ONDANSETRON HCL 4 MG/2ML IJ SOLN: 4.0000 mg | Freq: Four times a day (QID) | INTRAMUSCULAR | Status: DC | PRN

## 2022-02-04 MED ORDER — MIDAZOLAM HCL 2 MG/ML PO SYRP: 8.0000 mg | ORAL_SOLUTION | Freq: Once | ORAL | Status: DC | PRN

## 2022-02-04 MED ORDER — HYDRALAZINE HCL 20 MG/ML IJ SOLN: 5.0000 mg | INTRAMUSCULAR | Status: DC | PRN

## 2022-02-04 MED ORDER — SODIUM CHLORIDE 0.9 % IV SOLN: INTRAVENOUS | Status: AC

## 2022-02-04 SURGICAL SUPPLY — 12 items
CATH ANGIO 5F PIGTAIL 65CM (CATHETERS) ×1 IMPLANT
COVER PROBE U/S 5X48 (MISCELLANEOUS) ×1 IMPLANT
DEVICE STARCLOSE SE CLOSURE (Vascular Products) ×1 IMPLANT
GLIDEWIRE ADV .035X260CM (WIRE) ×1 IMPLANT
NDL ENTRY 21GA 7CM ECHOTIP (NEEDLE) IMPLANT
NEEDLE ENTRY 21GA 7CM ECHOTIP (NEEDLE) ×2 IMPLANT
PACK ANGIOGRAPHY (CUSTOM PROCEDURE TRAY) ×2 IMPLANT
SET INTRO CAPELLA COAXIAL (SET/KITS/TRAYS/PACK) ×1 IMPLANT
SHEATH BRITE TIP 5FRX11 (SHEATH) ×1 IMPLANT
SYR MEDRAD MARK 7 150ML (SYRINGE) ×1 IMPLANT
TUBING CONTRAST HIGH PRESS 72 (TUBING) ×1 IMPLANT
WIRE GUIDERIGHT .035X150 (WIRE) ×1 IMPLANT

## 2022-02-04 NOTE — Progress Notes (Signed)
Spoke with Primary RN re: PICC, RN stated patient is in procedure/cath lab at this time. Primary RN aware that PICC will be done tomorrow 8/2.

## 2022-02-04 NOTE — Progress Notes (Signed)
Physical Therapy Treatment Patient Details Name: Daniel Ritter MRN: [id number] DOB: Feb 01, 1976 Today's Date: 02/04/2022   History of Present Illness Pt is a 46 y.o. male presenting to hospital 7/25 with foot wounds.  Pt admitted with sepsis d/t cellulitis and acute osteomyelitis of R ankle/foot.  S/p 7/27: Excision second metatarsophalangeal joint left foot; Partial fifth ray amputation right foot; Full-thickness excisional ulcer debridement to tendon plantar left foot; Full-thickness ulcer debridement to subcutaneous tissue plantar right foot.  PMH includes DM, CVA, htn, neuropathy, anxiety, seizures, cytotoxic brain edema, ICH, and ACL repair L.    PT Comments    Pt was pleasant and motivated to participate during the session and put forth good effort throughout. Pt practiced squat pivot/lateral scoot transfers bed to/from w/c with cuing for minimizing WB through bilateral heels and was able to demonstrate good carryover of proper form with heavy emphasis on BUE use during transfers.  Pt able to maintain WB limitations with emphasis on WB through heels only.  No adverse symptoms during the session other than Bil foot pain.  Pt demonstrated good control of w/c including during training for backwards navigation and negotiating tight spaces.  Pt will benefit from continued PT services per physician recommendation upon discharge to safely address deficits listed in patient problem list for decreased caregiver assistance and eventual return to PLOF.     Recommendations for follow up therapy are one component of a multi-disciplinary discharge planning process, led by the attending physician.  Recommendations may be updated based on patient status, additional functional criteria and insurance authorization.  Follow Up Recommendations  Follow physician's recommendations for discharge plan and follow up therapies     Assistance Recommended at Discharge Set up Supervision/Assistance  Patient can return  home with the following A little help with walking and/or transfers;A little help with bathing/dressing/bathroom;Assistance with cooking/housework;Assist for transportation;Help with stairs or ramp for entrance   Equipment Recommendations  BSC/3in1;Wheelchair (measurements PT);Wheelchair cushion (measurements PT)    Recommendations for Other Services       Precautions / Restrictions Precautions Precautions: Fall Precaution Comments: B post op shoes Restrictions Weight Bearing Restrictions: Yes RLE Weight Bearing: Partial weight bearing LLE Weight Bearing: Partial weight bearing Other Position/Activity Restrictions: Heel contact and surgical shoes B LE's; Heel WB for transfers only (pt can WB slightly more on L than R foot)     Mobility  Bed Mobility Overal bed mobility: Modified Independent             General bed mobility comments: Min exta time and effort only, no physical assist needed    Transfers Overall transfer level: Needs assistance Equipment used: None Transfers: Bed to chair/wheelchair/BSC       Squat pivot transfers: Supervision     General transfer comment: Squat pivot/lateral scoot bed to/from manual w/c with vc's for lateral scooting and minimizing WB'ing through the heels; min vc's for lining up w/c for transfer and for w/c management    Ambulation/Gait               General Gait Details: Not appropriate at this time--transfers only per orders and MD Kaleen Mask propulsion: Both upper extremities Distance: 150 Wheelchair Assistance Details (indicate cue type and reason): Min vc's for overall technique for propelling w/c, turns both forwards and backwards, and navigating tight spaces  Modified Rankin (Stroke Patients Only)  Balance Overall balance assessment: Needs assistance Sitting-balance support: No upper extremity supported, Feet unsupported Sitting  balance-Leahy Scale: Normal                                      Cognition Arousal/Alertness: Awake/alert Behavior During Therapy: WFL for tasks assessed/performed Overall Cognitive Status: Within Functional Limits for tasks assessed                                          Exercises Total Joint Exercises Long Arc Quad: AROM, Strengthening, Both, 10 reps, 5 reps Knee Flexion: AROM, Strengthening, Both, 5 reps, 10 reps Other Exercises Other Exercises: w/c training per above    General Comments        Pertinent Vitals/Pain Pain Assessment Pain Assessment: 0-10 Pain Score: 3  Pain Location: Bil feet Pain Descriptors / Indicators: Sore Pain Intervention(s): Premedicated before session, Monitored during session    Home Living                          Prior Function            PT Goals (current goals can now be found in the care plan section) Progress towards PT goals: Progressing toward goals    Frequency    7X/week      PT Plan Current plan remains appropriate    Co-evaluation              AM-PAC PT "6 Clicks" Mobility   Outcome Measure  Help needed turning from your back to your side while in a flat bed without using bedrails?: None Help needed moving from lying on your back to sitting on the side of a flat bed without using bedrails?: None Help needed moving to and from a bed to a chair (including a wheelchair)?: A Little Help needed standing up from a chair using your arms (e.g., wheelchair or bedside chair)?: A Little Help needed to walk in hospital room?: Total Help needed climbing 3-5 steps with a railing? : Total 6 Click Score: 16    End of Session Equipment Utilized During Treatment: Other (comment) (B post op shoes) Activity Tolerance: Patient tolerated treatment well Patient left: in bed;with nursing/sitter in room;with call bell/phone within reach Nurse Communication: Mobility status;Weight  bearing status PT Visit Diagnosis: Other abnormalities of gait and mobility (R26.89);Muscle weakness (generalized) (M62.81);Pain Pain - Right/Left:  (bilateral) Pain - part of body: Ankle and joints of foot     Time: 7867-5449 PT Time Calculation (min) (ACUTE ONLY): 17 min  Charges:  $Therapeutic Activity: 8-22 mins                     D. Scott Jhovany Weidinger PT, DPT 02/04/22, 2:12 PM

## 2022-02-04 NOTE — Progress Notes (Signed)
Daily Progress Note   Subjective  - 5 Days Post-Op  Follow-up bilateral foot surgery.  He is doing well.  No complaints of pain.  He is held off on eating this morning.  He was awaiting information regards to an angio.  This was canceled last Friday secondary to being fed.  Objective Vitals:   02/03/22 0813 02/03/22 1715 02/03/22 2002 02/04/22 0509  BP: 104/72 107/71 100/69 97/65  Pulse: 69 81 66 69  Resp: '16 16 18 17  '$ Temp: 97.6 F (36.4 C) 97.9 F (36.6 C) 97.6 F (36.4 C) 97.8 F (36.6 C)  TempSrc:  Oral    SpO2: 99% 97% 97% 96%  Weight:      Height:        Physical Exam: The left second toe perfusion looks to have come back nicely.  Dressings are in place.  Dressing orders have been placed.  Laboratory CBC    Component Value Date/Time   WBC 11.9 (H) 02/02/2022 0737   HGB 13.9 02/02/2022 0737   HGB 13.9 10/30/2011 1123   HCT 40.7 02/02/2022 0737   HCT 37.0 (L) 10/10/2011 1407   PLT 254 02/02/2022 0737   PLT 191 10/30/2011 1123    BMET    Component Value Date/Time   NA 134 (L) 02/02/2022 0737   NA 140 11/08/2014 0851   NA 136 09/20/2011 0727   K 3.9 02/02/2022 0737   K 4.0 10/10/2011 1407   CL 103 02/02/2022 0737   CL 103 09/20/2011 0727   CO2 23 02/02/2022 0737   CO2 21 09/20/2011 0727   GLUCOSE 107 (H) 02/02/2022 0737   GLUCOSE 153 (H) 09/20/2011 0727   BUN 18 02/02/2022 0737   BUN 13 11/08/2014 0851   BUN 11 09/20/2011 0727   CREATININE 0.67 02/02/2022 0737   CREATININE 0.75 10/10/2011 1407   CALCIUM 9.4 02/02/2022 0737   CALCIUM 9.2 09/20/2011 0727   GFRNONAA >60 02/02/2022 0737   GFRNONAA >60 10/10/2011 1407   GFRAA >60 12/12/2015 1540   GFRAA >60 10/10/2011 1407    Assessment/Planning: Osteomyelitis bilateral feet Diabetic foot ulcers  Appreciate infectious disease recommendations. I have reached out to vascular in regards to angio today. From podiatry standpoint patient is stable.  Dressing orders have been placed.  Physical therapy  has been involved in his weightbearing status.  He is transfer only at this time. Follow-up with podiatry in 2 weeks in the outpatient clinic.  Samara Deist A  02/04/2022, 8:19 AM

## 2022-02-04 NOTE — Plan of Care (Signed)
  Problem: Tissue Perfusion: Goal: Adequacy of tissue perfusion will improve 02/04/2022 0204 by Suzette Battiest, LPN Outcome: Progressing   Problem: Clinical Measurements: Goal: Signs and symptoms of infection will decrease 02/04/2022 0204 by Suzette Battiest, LPN Outcome: Progressing   Problem: Clinical Measurements: Goal: Diagnostic test results will improve 02/04/2022 0204 by Suzette Battiest, LPN Outcome: Progressing   Problem: Respiratory: Goal: Ability to maintain adequate ventilation will improve 02/04/2022 0204 by Suzette Battiest, LPN Outcome: Progressing   Problem: Respiratory: Goal: Ability to maintain adequate ventilation will improve 02/04/2022 0204 by Suzette Battiest, LPN Outcome: Progressing   Problem: Education: Goal: Knowledge of General Education information will improve Description: Including pain rating scale, medication(s)/side effects and non-pharmacologic comfort measures 02/04/2022 0204 by Suzette Battiest, LPN Outcome: Progressing   Problem: Health Behavior/Discharge Planning: Goal: Ability to manage health-related needs will improve 02/04/2022 0204 by Suzette Battiest, LPN Outcome: Progressing   Problem: Clinical Measurements: Goal: Ability to maintain clinical measurements within normal limits will improve 02/04/2022 0204 by Suzette Battiest, LPN Outcome: Progressing   Problem: Clinical Measurements: Goal: Will remain free from infection 02/04/2022 0204 by Suzette Battiest, LPN Outcome: Progressing   Problem: Clinical Measurements: Goal: Diagnostic test results will improve 02/04/2022 0204 by Suzette Battiest, LPN Outcome: Progressing   Problem: Clinical Measurements: Goal: Respiratory complications will improve 02/04/2022 0204 by Suzette Battiest, LPN Outcome: Progressing   Problem: Clinical Measurements: Goal: Cardiovascular complication will be avoided 02/04/2022 0204 by Suzette Battiest, LPN Outcome: Progressing   Problem: Activity: Goal: Risk for activity  intolerance will decrease 02/04/2022 0204 by Suzette Battiest, LPN Outcome: Progressing   Problem: Activity: Goal: Risk for activity intolerance will decrease 02/04/2022 0204 by Suzette Battiest, LPN Outcome: Progressing   Problem: Nutrition: Goal: Adequate nutrition will be maintained 02/04/2022 0204 by Suzette Battiest, LPN Outcome: Progressing   Problem: Coping: Goal: Level of anxiety will decrease 02/04/2022 0204 by Suzette Battiest, LPN Outcome: Progressing   Problem: Elimination: Goal: Will not experience complications related to urinary retention 02/04/2022 0204 by Suzette Battiest, LPN Outcome: Progressing   Problem: Pain Managment: Goal: General experience of comfort will improve 02/04/2022 0204 by Suzette Battiest, LPN Outcome: Progressing   Problem: Safety: Goal: Ability to remain free from injury will improve 02/04/2022 0204 by Suzette Battiest, LPN Outcome: Progressing   Problem: Skin Integrity: Goal: Risk for impaired skin integrity will decrease 02/04/2022 0204 by Suzette Battiest, LPN Outcome: Progressing   Problem: Education: Goal: Ability to describe self-care measures that may prevent or decrease complications (Diabetes Survival Skills Education) will improve 02/04/2022 0204 by Suzette Battiest, LPN Outcome: Progressing   Problem: Education: Goal: Individualized Educational Video(s) 02/04/2022 0204 by Suzette Battiest, LPN Outcome: Progressing   Problem: Coping: Goal: Ability to adjust to condition or change in health will improve 02/04/2022 0204 by Suzette Battiest, LPN Outcome: Progressing   Problem: Fluid Volume: Goal: Ability to maintain a balanced intake and output will improve 02/04/2022 0204 by Suzette Battiest, LPN Outcome: Progressing

## 2022-02-04 NOTE — Progress Notes (Signed)
PROGRESS NOTE    DANIELLE LENTO  MGN:[id number] DOB: [birth year] DOA: 01/28/2022 PCP: Theotis Burrow, MD    Brief Narrative:   46 y.o. Caucasian male with medical history significant for anxiety, type diabetes mellitus, GERD, hypertension, seizure disorder, peripheral neuropathy and pancreatitis, presented to the emergency room with acute onset of worsening right foot swelling with erythema as well as deteriorating ulcer on the right fifth MTP which has been there for a couple of months.  Patient thought it was initially healing and later on noticed worsening redness and purulent discharge since Wednesday.  He also noticed a black bump per his report.  Since then erythematous been extending to his left foot dorsum.  He has a scab in his left first web space and plantar ulcer on the left foot which has not significantly worsened.  He denies any chest pain or dyspnea or cough.  No nausea or vomiting or abdominal pain.  No fever or chills.  No dysuria, oliguria or hematuria or flank pain.  MRI confirmed septic arthritis with associated osteomyelitis.  Seen by podiatry.  Status post surgical resection 7/27.  Surgical path resulted.  Seen by infectious disease.  Recommend long course of IV antibiotics.   Assessment & Plan:   Principal Problem:   Sepsis due to cellulitis [facility]) Active Problems:   Acute osteomyelitis of ankle or foot, right (HCC)   Anxiety and depression   Essential hypertension   Seizures (HCC)   Type 2 diabetes mellitus with peripheral neuropathy (HCC)   Malnutrition of moderate degree  Acute osteomyelitis and septic arthritis of right foot Overlying cellulitis Sepsis secondary to above Sepsis physiology criteria met by tachycardia leukocytosis MRI confirms osteomyelitis with associated septic arthritis Wound with minimal drainage, tender to touch Status post surgical resection 7/27, tolerated procedure well Surgical path resulted.  Infectious disease recommending  4-week course of IV antibiotics Plan: Currently on Zyvox and Zosyn, will continue Ordered PICC line Will need 4-week course of IV antibiotics Infectious disease to place O PAT orders Vascular engaged for angiogram Angiogram to be completed today Anticipate discharge within the next 24 hours  Type 2 diabetes mellitus with peripheral neuropathy Hemoglobin A1c 5.7, well controlled Plan: Continue sliding-scale coverage carb modified diet Continue Tradjenta Can resume home regimen at time of discharge  Anxiety and depression PTA Xanax and Cymbalta  History of seizure disorder PTA Keppra  Essential hypertension BP controlled Continue PTA regimen of Toprol-XL, lisinopril, amlodipine, hydrochlorothiazide Vital per unit protocol  GERD Continue PPI    DVT prophylaxis: SQ Lovenox Code Status: Full Family Communication: Father at bedside 7/27 Disposition Plan: Status is: Inpatient Remains inpatient appropriate because: Acute osteomyelitis of right foot.  Postoperative day # 6 status post surgical resection.  Pain well controlled.  Plan for angiography with vascular surgery today.  PICC line ordered.  Infectious disease follow-up for OPAT orders  Level of care: Med-Surg  Consultants:  Podiatry ID Vascular surgery  Procedures:  Surgical resection right foot wound with partial ray amputation 7/27  Antimicrobials: Zosyn Zyvox   Subjective: Seen and examined.  No pain.  Resting comfortably in bed.  No complaints  Objective: Vitals:   02/04/22 0509 02/04/22 0839 02/04/22 1104 02/04/22 1105  BP: 97/65 103/68 107/74 107/74  Pulse: 69 68 64 64  Resp: _0 Temp: 97.8 F (36.6 C)  98.2 F (36.8 C) 98.2 F (36.8 C)  TempSrc:      SpO2: 96% 96% 100% 100%  Weight:  Height:        Intake/Output Summary (Last 24 hours) at 02/04/2022 1240 Last data filed at 02/04/2022 0048 Gross per 24 hour  Intake 240 ml  Output 750 ml  Net -510 ml   Filed Weights    01/28/22 1242 01/30/22 1215  Weight: 108.4 kg 108.4 kg    Examination:  General exam: NAD.  Resting comfortably in bed Respiratory system: Lungs clear.  Normal work of breathing.  Room air Cardiovascular system: S1-S2, RRR, no murmurs, no pedal edema Gastrointestinal system: Soft, NT/ND, normal bowel sounds Central nervous system: Alert, oriented x3, no focal deficits Extremities: Symmetric 5 x 5 power. Skin: Right foot in surgical dressing, dressing CDI Psychiatry: Judgement and insight appear normal. Mood & affect appropriate.     Data Reviewed: I have personally reviewed following labs and imaging studies  CBC: Recent Labs  Lab 01/28/22 1251 01/29/22 0506 02/02/22 0737  WBC 15.7* 10.6* 11.9*  NEUTROABS 12.6*  --  7.7  HGB 14.9 13.4 13.9  HCT 43.6 39.8 40.7  MCV 88.4 89.0 87.9  PLT 311 249 542   Basic Metabolic Panel: Recent Labs  Lab 01/28/22 1251 01/29/22 0506 01/31/22 0530 02/02/22 0737  NA 136 137  --  134*  K 3.9 3.4*  --  3.9  CL 98 100  --  103  CO2 28 27  --  23  GLUCOSE 154* 117*  --  107*  BUN 15 10  --  18  CREATININE 1.06 0.79 0.63 0.67  CALCIUM 9.9 9.4  --  9.4   GFR: Estimated Creatinine Clearance: 147.4 mL/min (by C-G formula based on SCr of 0.67 mg/dL). Liver Function Tests: Recent Labs  Lab 01/28/22 1251  AST 36  ALT 23  ALKPHOS 82  BILITOT 0.9  PROT 9.5*  ALBUMIN 3.9   No results for input(s): "LIPASE", "AMYLASE" in the last 168 hours. No results for input(s): "AMMONIA" in the last 168 hours. Coagulation Profile: Recent Labs  Lab 01/28/22 1251 01/29/22 0506  INR 1.1 1.1   Cardiac Enzymes: No results for input(s): "CKTOTAL", "CKMB", "CKMBINDEX", "TROPONINI" in the last 168 hours. BNP (last 3 results) No results for input(s): "PROBNP" in the last 8760 hours. HbA1C: No results for input(s): "HGBA1C" in the last 72 hours.  CBG: Recent Labs  Lab 02/03/22 1120 02/03/22 1708 02/03/22 2158 02/04/22 0802 02/04/22 1102   GLUCAP 147* 87 85 90 139*   Lipid Profile: No results for input(s): "CHOL", "HDL", "LDLCALC", "TRIG", "CHOLHDL", "LDLDIRECT" in the last 72 hours. Thyroid Function Tests: No results for input(s): "TSH", "T4TOTAL", "FREET4", "T3FREE", "THYROIDAB" in the last 72 hours. Anemia Panel: No results for input(s): "VITAMINB12", "FOLATE", "FERRITIN", "TIBC", "IRON", "RETICCTPCT" in the last 72 hours. Sepsis Labs: Recent Labs  Lab 01/28/22 1251 01/28/22 1840 01/29/22 0506  PROCALCITON  --   --  <0.10  LATICACIDVEN 0.9 1.1  --     Recent Results (from the past 240 hour(s))  Culture, blood (Routine x 2)     Status: Abnormal (Preliminary result)   Collection Time: 01/28/22 12:51 PM   Specimen: BLOOD  Result Value Ref Range Status   Specimen Description   Final    BLOOD LEFT ANTECUBITAL Performed at [facility], [address] [place], [place] [address]    Special Requests   Final    BOTTLES DRAWN AEROBIC AND ANAEROBIC Blood Culture adequate volume Performed at [facility], [address] [place], [place] [address]    Culture  Setup Time  Final    GRAM POSITIVE COCCI ANAEROBIC BOTTLE ONLY CRITICAL RESULT CALLED TO, READ BACK BY AND VERIFIED WITH: WILL ANDERSON ON 02/01/22 AT 8250 QSD Performed at [facility], [place] [address] [place], [place] [address]    Culture (A)  Final    PEPTOSTREPTOCOCCUS ASACCHAROLYTICUS GRAM POSITIVE COCCI    Report Status PENDING  Incomplete  Blood Culture ID Panel (Reflexed)     Status: None   Collection Time: 01/28/22 12:51 PM  Result Value Ref Range Status   Enterococcus faecalis NOT DETECTED NOT DETECTED Final   Enterococcus Faecium NOT DETECTED NOT DETECTED Final   Listeria monocytogenes NOT DETECTED NOT DETECTED Final   Staphylococcus species NOT DETECTED NOT DETECTED Final   Staphylococcus aureus (BCID) NOT DETECTED NOT DETECTED Final   Staphylococcus epidermidis NOT DETECTED NOT DETECTED Final   Staphylococcus  lugdunensis NOT DETECTED NOT DETECTED Final   Streptococcus species NOT DETECTED NOT DETECTED Final   Streptococcus agalactiae NOT DETECTED NOT DETECTED Final   Streptococcus pneumoniae NOT DETECTED NOT DETECTED Final   Streptococcus pyogenes NOT DETECTED NOT DETECTED Final   A.calcoaceticus-baumannii NOT DETECTED NOT DETECTED Final   Bacteroides fragilis NOT DETECTED NOT DETECTED Final   Enterobacterales NOT DETECTED NOT DETECTED Final   Enterobacter cloacae complex NOT DETECTED NOT DETECTED Final   Escherichia coli NOT DETECTED NOT DETECTED Final   Klebsiella aerogenes NOT DETECTED NOT DETECTED Final   Klebsiella oxytoca NOT DETECTED NOT DETECTED Final   Klebsiella pneumoniae NOT DETECTED NOT DETECTED Final   Proteus species NOT DETECTED NOT DETECTED Final   Salmonella species NOT DETECTED NOT DETECTED Final   Serratia marcescens NOT DETECTED NOT DETECTED Final   Haemophilus influenzae NOT DETECTED NOT DETECTED Final   Neisseria meningitidis NOT DETECTED NOT DETECTED Final   Pseudomonas aeruginosa NOT DETECTED NOT DETECTED Final   Stenotrophomonas maltophilia NOT DETECTED NOT DETECTED Final   Candida albicans NOT DETECTED NOT DETECTED Final   Candida auris NOT DETECTED NOT DETECTED Final   Candida glabrata NOT DETECTED NOT DETECTED Final   Candida krusei NOT DETECTED NOT DETECTED Final   Candida parapsilosis NOT DETECTED NOT DETECTED Final   Candida tropicalis NOT DETECTED NOT DETECTED Final   Cryptococcus neoformans/gattii NOT DETECTED NOT DETECTED Final    Comment: Performed at [facility], [address] [place], [place] [address]  Culture, blood (Routine x 2)     Status: None   Collection Time: 01/28/22  3:37 PM   Specimen: BLOOD  Result Value Ref Range Status   Specimen Description BLOOD BLOOD LEFT HAND  Final   Special Requests   Final    BOTTLES DRAWN AEROBIC AND ANAEROBIC Blood Culture results may not be optimal due to an inadequate volume of blood received in  culture bottles   Culture   Final    NO GROWTH 5 DAYS Performed at [facility], [address] [place], [place] [address]    Report Status 02/02/2022 FINAL  Final  Surgical pcr screen     Status: None   Collection Time: 01/30/22  1:35 AM   Specimen: Nasal Mucosa; Nasal Swab  Result Value Ref Range Status   MRSA, PCR NEGATIVE NEGATIVE Final   Staphylococcus aureus NEGATIVE NEGATIVE Final    Comment: (NOTE) The Xpert SA Assay (FDA approved for NASAL specimens in patients 59 years of age and older), is one component of a comprehensive surveillance program. It is not intended to diagnose infection nor to guide or monitor treatment. Performed at  [place], [place] [address]   Aerobic/Anaerobic Culture w Gram Stain (surgical/deep wound)     Status: None   Collection Time: 01/30/22  1:35 AM   Specimen: Foot; Wound  Result Value Ref Range Status   Specimen Description   Final    FOOT RIGTH Performed at [facility], [address] [place], [place] [address]    Special Requests   Final    NONE Performed at [facility], [address] [place], [place] [address]    Gram Stain   Final    NO SQUAMOUS EPITHELIAL CELLS SEEN FEW WBC SEEN MODERATE GRAM POSITIVE COCCI    Culture   Final    FEW PROTEUS MIRABILIS FEW STAPHYLOCOCCUS AUREUS NO ANAEROBES ISOLATED Performed at [facility], [employer] [address] [place], [place] [address]    Report Status 02/04/2022 FINAL  Final   Organism ID, Bacteria PROTEUS MIRABILIS  Final   Organism ID, Bacteria STAPHYLOCOCCUS AUREUS  Final      Susceptibility   Proteus mirabilis - MIC*    AMPICILLIN >=32 RESISTANT Resistant     CEFAZOLIN 8 SENSITIVE Sensitive     CEFEPIME <=0.12 SENSITIVE Sensitive     CEFTAZIDIME <=1 SENSITIVE Sensitive     CEFTRIAXONE <=0.25 SENSITIVE Sensitive     CIPROFLOXACIN <=0.25 SENSITIVE Sensitive     GENTAMICIN <=1 SENSITIVE Sensitive     IMIPENEM  2 SENSITIVE Sensitive     TRIMETH/SULFA >=320 RESISTANT Resistant     AMPICILLIN/SULBACTAM 16 INTERMEDIATE Intermediate     PIP/TAZO <=4 SENSITIVE Sensitive     * FEW PROTEUS MIRABILIS   Staphylococcus aureus - MIC*    CIPROFLOXACIN <=0.5 SENSITIVE Sensitive     ERYTHROMYCIN <=0.25 SENSITIVE Sensitive     GENTAMICIN <=0.5 SENSITIVE Sensitive     OXACILLIN <=0.25 SENSITIVE Sensitive     TETRACYCLINE <=1 SENSITIVE Sensitive     VANCOMYCIN <=0.5 SENSITIVE Sensitive     TRIMETH/SULFA <=10 SENSITIVE Sensitive     CLINDAMYCIN <=0.25 SENSITIVE Sensitive     RIFAMPIN <=0.5 SENSITIVE Sensitive     Inducible Clindamycin NEGATIVE Sensitive     * FEW STAPHYLOCOCCUS AUREUS  Aerobic/Anaerobic Culture w Gram Stain (surgical/deep wound)     Status: None (Preliminary result)   Collection Time: 01/30/22  1:19 PM   Specimen: PATH Other; Tissue  Result Value Ref Range Status   Specimen Description   Final    WOUND Performed at [facility], [address], [place], [place] [address]    Special Requests   Final    RIGHT 5TH METATARSAL Performed at [facility], [address] [place], [place] [address]    Gram Stain   Final    FEW WBC PRESENT, PREDOMINANTLY PMN NO ORGANISMS SEEN Performed at [facility], [address] [place], [place] [address]    Culture   Final    RARE PROTEUS MIRABILIS SUSCEPTIBILITIES PERFORMED ON PREVIOUS CULTURE WITHIN THE LAST 5 DAYS. NO ANAEROBES ISOLATED; CULTURE IN PROGRESS FOR 5 DAYS    Report Status PENDING  Incomplete  Aerobic Culture w Gram Stain (superficial specimen)     Status: None (Preliminary result)   Collection Time: 02/03/22  6:24 PM   Specimen: Toe; Wound  Result Value Ref Range Status   Specimen Description   Final    TOE Performed at [facility], [address] [place], [place] [address]    Special Requests   Final  NONE Performed at [facility], [place], [place] [address]     Gram Stain   Final    NO SQUAMOUS EPITHELIAL CELLS SEEN NO WBC SEEN NO ORGANISMS SEEN    Culture   Final    NO GROWTH < 12 HOURS Performed at [facility], [place] [address] [place], [place] [address]    Report Status PENDING  Incomplete         Radiology Studies: [place] EKG SITE RITE  Result Date: 02/04/2022 If Site Rite image not attached, placement could not be confirmed due to current cardiac rhythm.       Scheduled Meds:  ALPRAZolam  0.25 mg Oral BID   amLODipine  10 mg Oral Daily   vitamin C  500 mg Oral BID   DULoxetine  20 mg Oral Daily   enoxaparin (LOVENOX) injection  0.5 mg/kg Subcutaneous Q24H   hydrochlorothiazide  12.5 mg Oral Daily   insulin aspart  0-15 Units Subcutaneous TID WC   insulin aspart  0-5 Units Subcutaneous QHS   levETIRAcetam  500 mg Oral BID   linagliptin  5 mg Oral Daily   lisinopril  20 mg Oral Daily   metoprolol succinate  100 mg Oral Daily   multivitamin with minerals  1 tablet Oral Daily   nutrition supplement (JUVEN)  1 packet Oral BID BM   pantoprazole  40 mg Oral Daily   Ensure Max Protein  11 oz Oral QHS   zinc sulfate  220 mg Oral Daily   Continuous Infusions:  linezolid (ZYVOX) IV 600 mg (02/04/22 1016)   piperacillin-tazobactam (ZOSYN)  IV 3.375 g (02/04/22 0557)     LOS: 7 days      Sidney Ace, MD [facility]   If 7PM-7AM, please contact night-coverage  02/04/2022, 12:40 PM

## 2022-02-04 NOTE — Op Note (Signed)
[facility]  Percutaneous Study/Intervention Procedural Note   Date of Surgery: 02/04/2022,3:56 PM  Surgeon:Lee Kuang, Dolores Lory   Pre-operative Diagnosis: Atherosclerotic occlusive disease bilateral lower extremities with ulceration of the right foot  Post-operative diagnosis:  Same  Procedure(s) Performed:  1.  Abdominal aortogram  2.  Selective injection of the right lower extremity third order catheter placement  3.  Ultrasound-guided access to the left common femoral artery  4.  StarClose left femoral artery    Anesthesia: Conscious sedation was administered by the interventional radiology RN under my direct supervision. IV Versed plus fentanyl were utilized. Continuous ECG, pulse oximetry and blood pressure was monitored throughout the entire procedure.  Conscious sedation was administered for a total of 28 minutes.  Sheath: 5 French 11 cm Pinnacle sheath retrograde left common femoral  Contrast: 60 cc   Fluoroscopy Time: 2.1 minutes  Indications:  The patient presents to [facility] with atherosclerotic occlusive disease bilateral lower extremities with ulceration of the left foot.  He underwent surgical debridement but was noted to have poor bleeding and he has been slow to granulate his wound.  Pedal pulses are weakly palpable on the right suggesting hemodynamically significant atherosclerotic occlusive disease.  The risks and benefits as well as alternative therapies for lower extremity revascularization are reviewed with the patient all questions are answered the patient agrees to proceed.  The patient is therefore undergoing angiography with the hope for intervention for limb salvage.   Procedure:  Wadell Craddock Langstonis a 46 y.o. male who was identified and appropriate procedural time out was performed.  The patient was then placed supine on the table and prepped and draped in the usual sterile fashion.  Ultrasound was used to evaluate the left common  femoral artery.  It was echolucent and pulsatile indicating it is patent .  An ultrasound image was acquired for the permanent record.  A micropuncture needle was used to access the left common femoral artery under direct ultrasound guidance.  The microwire was then advanced under fluoroscopic guidance without difficulty followed by the micro-sheath.  A 0.035 J wire was advanced without resistance and a 5Fr sheath was placed.    Pigtail catheter was then advanced to the level of T12 and AP projection of the aorta was obtained. Pigtail catheter was then repositioned to above the bifurcation and LAO view of the pelvis was obtained.  Advantage Glidewire and the pigtail catheter was then used across the bifurcation and the catheter was positioned in the distal external iliac artery.  RAO of the right groin was then obtained. Wire was reintroduced and negotiated into the SFA and the catheter was advanced into the SFA. Distal runoff was then performed.  After review of the images the catheter was removed over wire and an LAO view of the groin was obtained. StarClose device was deployed without difficulty.   Findings:   Aortogram: The abdominal aorta is opacified with a bolus injection contrast.  Single renal arteries are noted bilaterally with normal nephrograms.  No evidence of hemodynamically significant renal artery stenosis.  There are no hemodynamically significant stenoses identified within the aorta.  The aortic bifurcation is mildly diseased but widely patent.  Bilateral common internal and external iliac arteries are free of hemodynamically significant lesions.  Right lower Extremity: The right common femoral, profunda femoris superficial femoral and popliteal arteries demonstrate mild atherosclerotic changes but there are no hemodynamically significant lesions.  Trifurcation is patent.  There is 3 vessel runoff to the foot.  SUMMARY: Based on these images no intervention is performed at this  time.    Disposition: Patient was taken to the recovery room in stable condition having tolerated the procedure well.  Belenda Cruise Yasmen Cortner 02/04/2022,3:56 PM

## 2022-02-04 NOTE — Interval H&P Note (Signed)
History and Physical Interval Note:  02/04/2022 2:23 PM  Daniel Ritter  has presented today for surgery, with the diagnosis of atherosclerosis obliterans with ulceration.  The various methods of treatment have been discussed with the patient and family. After consideration of risks, benefits and other options for treatment, the patient has consented to  Procedure(s): Lower Extremity Angiography (Right) as a surgical intervention.  The patient's history has been reviewed, patient examined, no change in status, stable for surgery.  I have reviewed the patient's chart and labs.  Questions were answered to the patient's satisfaction.     Hortencia Pilar

## 2022-02-04 NOTE — Progress Notes (Signed)
Nutrition Follow-up  DOCUMENTATION CODES:   Non-severe (moderate) malnutrition in context of chronic illness  INTERVENTION:   -Continue 500 mg vitamin C BID -Continue 220 mg zinc sulfate daily x 14 days -Continue MVI with minerals daily -Continue 1 packet Juven BID, each packet provides 95 calories, 2.5 grams of protein (collagen), and 9.8 grams of carbohydrate (3 grams sugar); also contains 7 grams of L-arginine and L-glutamine, 300 mg vitamin C, 15 mg vitamin E, 1.2 mcg vitamin B-12, 9.5 mg zinc, 200 mg calcium, and 1.5 g  Calcium Beta-hydroxy-Beta-methylbutyrate to support wound healing  -Continue Ensure Max po daily, each supplement provides 150 kcal and 30 grams of protein -Continue double protein portions with meals  NUTRITION DIAGNOSIS:   Moderate Malnutrition related to chronic illness (DM, peripheral neuropathy) as evidenced by mild fat depletion, mild muscle depletion.  Ongoing  GOAL:   Patient will meet greater than or equal to 90% of their needs  Progressing   MONITOR:   PO intake, Supplement acceptance  REASON FOR ASSESSMENT:   Malnutrition Screening Tool    ASSESSMENT:   Pt with medical history significant for anxiety, type diabetes mellitus, GERD, hypertension, seizure disorder, peripheral neuropathy and pancreatitis, presented with acute onset of worsening right foot swelling with erythema as well as deteriorating ulcer on the right fifth MTP which has been there for a couple of months.  7/27- s/p Procedure: 1.  Excision second metatarsophalangeal joint left foot 2.  Partial fifth ray amputation right foot 3.  Full-thickness excisional ulcer debridement to tendon plantar left foot 4.  Full-thickness ulcer debridement to subcutaneous tissue plantar right foot  Reviewed I/O's: +90 ml x 24 hours and +2.3 L since admission  UOP: 750 ml x 24 hours   Pt receiving nursing care at time of visit.   Per podiatry notes, no further surgery planned.   Plan for  PICC placement for IV antibiotics. Per MD notes, possible discharge tomorrow.   Pt currently NPO for angiogram.   Pt with good appetite. Noted meal completions 100%. Pt is taking supplements.   Medications reviewed and include vitamin C, keppra, and zinc sulfate.   Labs reviewed: Na: 134, Mg: 1.5, CBGS: 87-90 (inpatient orders for glycemic control are 0-15 units insulin aspart TID with meals, 5 mg tradjenta daily, and 0-5 units insulin aspart daily at bedtime).    Diet Order:   Diet Order             Diet NPO time specified  Diet effective now                   EDUCATION NEEDS:   Education needs have been addressed  Skin:  Skin Assessment: Skin Integrity Issues: Skin Integrity Issues:: Incisions Incisions: closed lt foot, closed rt foot  Last BM:  02/02/22  Height:   Ht Readings from Last 1 Encounters:  01/30/22 '6\' 1"'$  (1.854 m)    Weight:   Wt Readings from Last 1 Encounters:  01/30/22 108.4 kg    Ideal Body Weight:  83.6 kg  BMI:  Body mass index is 31.53 kg/m.  Estimated Nutritional Needs:   Kcal:  2300-2500  Protein:  125-150 grams  Fluid:  > 2 L    Loistine Chance, RD, LDN, [place] Registered Dietitian II Certified Diabetes Care and Education Specialist Please refer to [facility] for RD and/or RD on-call/weekend/after hours pager

## 2022-02-04 NOTE — Progress Notes (Signed)
ID Pt doing well Had angio and normal circulation in extremities  O/e awake and alert. BP 103/66 (BP Location: Right Arm)   Pulse 72   Temp 98.1 F (36.7 C)   Resp 18   Ht '6\' 1"'$  (1.854 m)   Wt 108.4 kg   SpO2 98%   BMI 31.53 kg/m   Chest CTA Hss1s2 Abd soft B/l feet        Labs    Latest Ref Rng & Units 02/02/2022    7:37 AM 01/29/2022    5:06 AM 01/28/2022   12:51 PM  CBC  WBC 4.0 - 10.5 K/uL 11.9  10.6  15.7   Hemoglobin 13.0 - 17.0 g/dL 13.9  13.4  14.9   Hematocrit 39.0 - 52.0 % 40.7  39.8  43.6   Platelets 150 - 400 K/uL 254  249  311        Latest Ref Rng & Units 02/02/2022    7:37 AM 01/31/2022    5:30 AM 01/29/2022    5:06 AM  CMP  Glucose 70 - 99 mg/dL 107   117   BUN 6 - 20 mg/dL 18   10   Creatinine 0.61 - 1.24 mg/dL 0.67  0.63  0.79   Sodium 135 - 145 mmol/L 134   137   Potassium 3.5 - 5.1 mmol/L 3.9   3.4   Chloride 98 - 111 mmol/L 103   100   CO2 22 - 32 mmol/L 23   27   Calcium 8.9 - 10.3 mg/dL 9.4   9.4     Pathology Fifth toe rt- acute osteo , proximal margin clear of osteo  Left second toe- metatarsophalangeal joint excision- acute osteo- margin clear   Micro- polymicrobial   Impression/recommendation Group B streptococcus and peptostreptococcus bacteremia 1 set- source is the foot infection- not a contaminant Current medication zosyn will cover both  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

## 2022-02-04 NOTE — Plan of Care (Signed)
  Problem: Fluid Volume: Goal: Hemodynamic stability will improve Outcome: Progressing   Problem: Clinical Measurements: Goal: Diagnostic test results will improve Outcome: Progressing Goal: Signs and symptoms of infection will decrease Outcome: Progressing   Problem: Respiratory: Goal: Ability to maintain adequate ventilation will improve Outcome: Progressing   Problem: Education: Goal: Knowledge of General Education information will improve Description: Including pain rating scale, medication(s)/side effects and non-pharmacologic comfort measures Outcome: Progressing   Problem: Health Behavior/Discharge Planning: Goal: Ability to manage health-related needs will improve Outcome: Progressing   Problem: Clinical Measurements: Goal: Ability to maintain clinical measurements within normal limits will improve Outcome: Progressing Goal: Will remain free from infection Outcome: Progressing Goal: Diagnostic test results will improve Outcome: Progressing Goal: Respiratory complications will improve Outcome: Progressing Goal: Cardiovascular complication will be avoided Outcome: Progressing   Problem: Activity: Goal: Risk for activity intolerance will decrease Outcome: Progressing   Problem: Nutrition: Goal: Adequate nutrition will be maintained Outcome: Progressing   Problem: Coping: Goal: Level of anxiety will decrease Outcome: Progressing   Problem: Elimination: Goal: Will not experience complications related to bowel motility Outcome: Progressing Goal: Will not experience complications related to urinary retention Outcome: Progressing   Problem: Pain Managment: Goal: General experience of comfort will improve Outcome: Progressing   Problem: Safety: Goal: Ability to remain free from injury will improve Outcome: Progressing   Problem: Skin Integrity: Goal: Risk for impaired skin integrity will decrease Outcome: Progressing   Problem: Education: Goal: Ability  to describe self-care measures that may prevent or decrease complications (Diabetes Survival Skills Education) will improve Outcome: Progressing   Problem: Coping: Goal: Ability to adjust to condition or change in health will improve Outcome: Progressing   Problem: Fluid Volume: Goal: Ability to maintain a balanced intake and output will improve Outcome: Progressing   Problem: Health Behavior/Discharge Planning: Goal: Ability to identify and utilize available resources and services will improve Outcome: Progressing Goal: Ability to manage health-related needs will improve Outcome: Progressing   Problem: Metabolic: Goal: Ability to maintain appropriate glucose levels will improve Outcome: Progressing   Problem: Nutritional: Goal: Maintenance of adequate nutrition will improve Outcome: Progressing Goal: Progress toward achieving an optimal weight will improve Outcome: Progressing   Problem: Skin Integrity: Goal: Risk for impaired skin integrity will decrease Outcome: Progressing   Problem: Tissue Perfusion: Goal: Adequacy of tissue perfusion will improve Outcome: Progressing

## 2022-02-04 NOTE — Plan of Care (Deleted)
  Problem: Fluid Volume: Goal: Hemodynamic stability will improve Outcome: Progressing   Problem: Clinical Measurements: Goal: Signs and symptoms of infection will decrease Outcome: Progressing   Problem: Respiratory: Goal: Ability to maintain adequate ventilation will improve Outcome: Progressing   Problem: Education: Goal: Knowledge of General Education information will improve Description: Including pain rating scale, medication(s)/side effects and non-pharmacologic comfort measures Outcome: Progressing   Problem: Clinical Measurements: Goal: Ability to maintain clinical measurements within normal limits will improve Outcome: Progressing   Problem: Clinical Measurements: Goal: Will remain free from infection Outcome: Progressing   Problem: Clinical Measurements: Goal: Diagnostic test results will improve Outcome: Progressing   Problem: Clinical Measurements: Goal: Respiratory complications will improve Outcome: Progressing   Problem: Clinical Measurements: Goal: Cardiovascular complication will be avoided Outcome: Progressing

## 2022-02-04 NOTE — Treatment Plan (Signed)
Diagnosis: GBS and peptostreptococcus bacteremia Diabetic infection of both feet Osteomyelitis  Baseline Creatinine <1  Culture Result: proteus, MSSA,  No Known Allergies  OPAT Orders Discharge antibiotics: Zosyn 15 grams as continuous infusion over 24 hours Duration: 4 weeks End Date: 02/25/22  [facility] Care Per Protocol:  Labs weekly while on IV antibiotics: _X_ CBC with differential  _X_ CMP _X_ CRP _X_ ESR   _X_ Please pull PIC at completion of IV antibiotics   Fax weekly lab results  promptly to [phone]  Clinic Follow Up Appt:with Dr.Deano Tomaszewski  02/20/22 at 10.30 Am   Call [phone] twith any questions

## 2022-02-05 ENCOUNTER — Encounter: Payer: Self-pay | Admitting: Vascular Surgery

## 2022-02-05 DIAGNOSIS — L089 Local infection of the skin and subcutaneous tissue, unspecified: Secondary | ICD-10-CM | POA: Diagnosis not present

## 2022-02-05 DIAGNOSIS — L0390 Cellulitis, unspecified: Secondary | ICD-10-CM | POA: Diagnosis not present

## 2022-02-05 DIAGNOSIS — A419 Sepsis, unspecified organism: Secondary | ICD-10-CM | POA: Diagnosis not present

## 2022-02-05 DIAGNOSIS — E11628 Type 2 diabetes mellitus with other skin complications: Secondary | ICD-10-CM | POA: Diagnosis not present

## 2022-02-05 DIAGNOSIS — M86171 Other acute osteomyelitis, right ankle and foot: Secondary | ICD-10-CM | POA: Diagnosis not present

## 2022-02-05 DIAGNOSIS — E1169 Type 2 diabetes mellitus with other specified complication: Secondary | ICD-10-CM | POA: Diagnosis not present

## 2022-02-05 LAB — SEDIMENTATION RATE: Sed Rate: 109 mm/hr — ABNORMAL HIGH (ref 0–15)

## 2022-02-05 LAB — GLUCOSE, CAPILLARY
Glucose-Capillary: 111 mg/dL — ABNORMAL HIGH (ref 70–99)
Glucose-Capillary: 133 mg/dL — ABNORMAL HIGH (ref 70–99)
Glucose-Capillary: 132 mg/dL — ABNORMAL HIGH (ref 70–99)
Glucose-Capillary: 85 mg/dL (ref 70–99)

## 2022-02-05 LAB — CULTURE, BLOOD (ROUTINE X 2): Special Requests: ADEQUATE

## 2022-02-05 LAB — C-REACTIVE PROTEIN: CRP: 2.4 mg/dL — ABNORMAL HIGH (ref <1.0)

## 2022-02-05 MED ORDER — CHLORHEXIDINE GLUCONATE CLOTH 2 % EX PADS
6.0000 | MEDICATED_PAD | Freq: Every day | CUTANEOUS | Status: DC
Start: 2022-02-05 — End: 2022-02-05
  Administered 2022-02-05: 6 via TOPICAL

## 2022-02-05 MED ORDER — SODIUM CHLORIDE 0.9% FLUSH: 10.0000 mL | INTRAVENOUS | Status: DC | PRN

## 2022-02-05 MED ORDER — SODIUM CHLORIDE 0.9% FLUSH
10.0000 mL | Freq: Two times a day (BID) | INTRAVENOUS | Status: DC
  Administered 2022-02-05: 10 mL

## 2022-02-05 MED ORDER — PIPERACILLIN-TAZOBACTAM IV (FOR PTA / DISCHARGE USE ONLY): 13.5000 g | INTRAVENOUS | 0 refills | Status: AC

## 2022-02-05 MED ORDER — ACETAMINOPHEN 325 MG PO TABS: 650.0000 mg | ORAL_TABLET | Freq: Four times a day (QID) | ORAL | Status: AC | PRN

## 2022-02-05 NOTE — Progress Notes (Signed)
PT Cancellation Note  Patient Details Name: [name] MRN: [id number] DOB: June 05, 1976   Cancelled Treatment:    Reason Eval/Treat Not Completed: Patient declined to participate with PT services secondary to preparing for discharge home. Pt reported feeling confident regarding functional mobility necessary for safe discharge with no questions for this PT or concerns to report.  Will attempt to see pt at a future date/time as medically appropriate if discharge is delayed.     Linus Salmons PT, DPT 02/05/22, 2:56 PM

## 2022-02-05 NOTE — Progress Notes (Signed)
ID Pt doing well He had PICC placement Was taught about IV antibiotic Patient Vitals for the past 24 hrs:  BP Temp Pulse Resp SpO2  02/05/22 1757 111/76 98.5 F (36.9 C) 68 16 100 %  02/05/22 1116 125/81 98.6 F (37 C) 69 16 100 %  02/05/22 0832 111/69 98.8 F (37.1 C) 81 16 100 %  02/05/22 0423 124/82 97.6 F (36.4 C) 68 16 100 %   Chest CTA Hss1s2 Feet          Labs    Latest Ref Rng & Units 02/02/2022    7:37 AM 01/29/2022    5:06 AM 01/28/2022   12:51 PM  CBC  WBC 4.0 - 10.5 K/uL 11.9  10.6  15.7   Hemoglobin 13.0 - 17.0 g/dL 13.9  13.4  14.9   Hematocrit 39.0 - 52.0 % 40.7  39.8  43.6   Platelets 150 - 400 K/uL 254  249  311        Latest Ref Rng & Units 02/02/2022    7:37 AM 01/31/2022    5:30 AM 01/29/2022    5:06 AM  CMP  Glucose 70 - 99 mg/dL 107   117   BUN 6 - 20 mg/dL 18   10   Creatinine 0.61 - 1.24 mg/dL 0.67  0.63  0.79   Sodium 135 - 145 mmol/L 134   137   Potassium 3.5 - 5.1 mmol/L 3.9   3.4   Chloride 98 - 111 mmol/L 103   100   CO2 22 - 32 mmol/L 23   27   Calcium 8.9 - 10.3 mg/dL 9.4   9.4     Group B streptococcus and peptostreptococcus bacteremia 1 set- source is the foot infection- not a contaminant Current medication zosyn will cover both   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

## 2022-02-05 NOTE — Discharge Summary (Addendum)
Physician Discharge Summary   Patient: Daniel Ritter MRN: [id number] DOB: 01-03-1976  Admit date:     01/28/2022  Discharge date: 02/05/22  Discharge Physician: Jennye Boroughs   PCP: Theotis Burrow, MD   Recommendations at discharge:   Follow-up with Dr. Steva Ready, [place] specialist on 02/20/2022 at 10:30 AM.  Discharge Diagnoses: Principal Problem:   Sepsis due to cellulitis [facility]) Active Problems:   Acute osteomyelitis of ankle or foot, right ([place])   Anxiety and depression   Essential hypertension   Seizures ([place])   Type 2 diabetes mellitus with peripheral neuropathy (HCC)   Malnutrition of moderate degree  Resolved Problems:   * No resolved hospital problems. [facility] Hospital Course:  Mr. Daniel Ritter is a 46 year old man with medical history significant for anxiety, depression, type diabetes mellitus, GERD, hypertension, seizure disorder, peripheral neuropathy, pancreatitis, presented to the emergency room with acute onset of worsening right foot swelling with erythema as well as deteriorating ulcer on the right fifth MTP which has been there for a couple of months.  Patient thought it was initially healing and later on noticed worsening redness and purulent discharge.  She presented to the hospital because of worsening symptoms.  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  He was found to have sepsis from cellulitis, septic arthritis and osteomyelitis of the right foot (right fifth metatarsophalangeal joint).  He was also found to have left second toe osteomyelitis.  He was treated with empiric IV antibiotics.  He underwent excision second metatarsal phalangeal joint left foot and partial fifth ray amputation right foot on 01/30/2022.  Abdominal aortogram was also performed on 02/04/2022.  ID specialist recommended that patient be discharged on IV Zosyn for 4 weeks.  Right arm PICC line was placed for long-term antibiotics.  His condition is improved and he is deemed stable for  discharge to home today.  He said he has been advised by podiatrist not to bear weight on his feet for 2 weeks so he will use his wheelchair to get around.       Consultants: Infectious disease specialist, vascular surgeon, podiatrist Procedures performed: Excision second metatarsal phalangeal joint left foot and partial fifth ray amputation right foot on 01/30/2022.  Abdominal aortogram was also performed on 02/04/2022.   Disposition: Home Diet recommendation:  Cardiac and Carb modified diet DISCHARGE MEDICATION: Allergies as of 02/05/2022   No Known Allergies      Medication List     STOP taking these medications    ferrous sulfate 325 (65 FE) MG tablet   gabapentin 800 MG tablet Commonly known as: NEURONTIN   potassium chloride 10 MEQ CR capsule Commonly known as: MICRO-K   vitamin B-12 250 MCG tablet Commonly known as: CYANOCOBALAMIN   Vitamin D (Ergocalciferol) 1.25 MG (50000 UNIT) Caps capsule Commonly known as: DRISDOL       TAKE these medications    acetaminophen 325 MG tablet Commonly known as: TYLENOL Take 2 tablets (650 mg total) by mouth every 6 (six) hours as needed for mild pain (or Fever >/= 101).   ALPRAZolam 0.25 MG tablet Commonly known as: XANAX Take 0.25 mg by mouth 2 (two) times daily. What changed: Another medication with the same name was removed. Continue taking this medication, and follow the directions you see here.   amLODipine 10 MG tablet Commonly known as: NORVASC Take 10 mg by mouth daily as needed.   DULoxetine 30 MG capsule Commonly known as: CYMBALTA Take 30 mg by  mouth daily.   Januvia 100 MG tablet Generic drug: sitaGLIPtin Take 100 mg by mouth daily.   levETIRAcetam 500 MG tablet Commonly known as: KEPPRA Take 1 tablet (500 mg total) by mouth 2 (two) times daily.   lisinopril-hydrochlorothiazide 20-12.5 MG tablet Commonly known as: ZESTORETIC Take 1 tablet by mouth daily.   metFORMIN 1000 MG tablet Commonly known  as: GLUCOPHAGE Take 1,000 mg by mouth 2 (two) times daily with a meal.   metoprolol succinate 100 MG 24 hr tablet Commonly known as: TOPROL-XL Take 100 mg by mouth at bedtime. Take with or immediately following a meal.   omeprazole 20 MG capsule Commonly known as: PRILOSEC Take 20 mg by mouth 2 (two) times daily before a meal.   piperacillin-tazobactam  IVPB Commonly known as: ZOSYN Inject 13.5 g into the vein continuous for 20 days. Infuse piperacillin/tazobactam 13.5gm IV over 24hr as continuous infusion Indication:  diabetic foot osteomyelitis and Grp B strep/peptostreptococcus bacteremia First Dose: Yes Last Day of Therapy:  02/25/2022 Labs - Once weekly:  CBC/D and CMP, ESR and CRP Please pull PIC at completion of IV antibiotics Fax weekly lab results  promptly to [phone] Method of administration: Elastomeric (Continuous infusion) Method of administration may be changed at the discretion of home infusion pharmacist based upon assessment of the patient and/or caregiver's ability to self-administer the medication ordered.               Discharge Care Instructions  (From admission, onward)           Start     Ordered   02/05/22 0000  Change dressing on IV access line weekly and PRN  (Home infusion instructions - [facility] )        02/05/22 1332   02/01/22 0000  Change dressing       Comments: Dressing change orders: Change dressing Mon/Wed/Fri.  Cleanse wound gently with soap and water.  Pat dry.  Apply calcium alginate to plantar 2nd mtpj ulcer and distal lateral foot ulcer.  Pain incisions with betadine.  Cover with bulky gauze bandage.   02/01/22 1648            Discharge Exam: Filed Weights   01/28/22 1242 01/30/22 1215 02/04/22 1421  Weight: 108.4 kg 108.4 kg 108.4 kg   GEN: NAD SKIN: Warm and dry EYES: No pallor or icterus ENT: MMM CV: RRR PULM: CTA B ABD: soft, ND, NT, +BS CNS: AAO x 3, non focal EXT: No edema or  tenderness   Condition at discharge: good  The results of significant diagnostics from this hospitalization (including imaging, microbiology, ancillary and laboratory) are listed below for reference.   Imaging Studies: PERIPHERAL VASCULAR CATHETERIZATION  Result Date: 02/04/2022 See surgical note for result.  [place] EKG SITE RITE  Result Date: 02/04/2022 If Site Rite image not attached, placement could not be confirmed due to current cardiac rhythm.  US ARTERIAL ABI (SCREENING LOWER EXTREMITY)  Result Date: 01/29/2022 CLINICAL DATA:  Wound/cellulitis EXAM: NONINVASIVE PHYSIOLOGIC VASCULAR STUDY OF BILATERAL LOWER EXTREMITIES TECHNIQUE: Evaluation of both lower extremities were performed at rest, including calculation of ankle-brachial indices with single level Doppler, pressure and pulse volume recording. COMPARISON:  None Available. FINDINGS: Right ABI:  Unable to calculate Left ABI:  1.0 Right Lower Extremity:  Biphasic arterial waveforms. Left Lower Extremity:  Biphasic arterial waveforms. 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 This is typically seen in the setting of medial arteriosclerosis. Signed, Criselda Peaches, MD, [facility] Electronically Signed   By: Jacqulynn Cadet M.D.   On: 01/29/2022 16:16   DG Foot Complete Left  Result Date: 01/29/2022 CLINICAL DATA:  Foot pain and swelling. EXAM: LEFT FOOT - COMPLETE 3+ VIEW COMPARISON:  Diabetic foot ulcer. FINDINGS: There appears to be an open wound on the plantar aspect of the forefoot. Advanced small vessel calcifications consistent with known diabetes. No definite plain film findings for septic arthritis or osteomyelitis. MRI may be helpful for further evaluation. IMPRESSION: 1. Open  wound on the plantar aspect of the forefoot. 2. No definite plain film findings for septic arthritis or osteomyelitis. Electronically Signed   By: Marijo Sanes M.D.   On: 01/29/2022 13:49   MR FOOT RIGHT W WO CONTRAST  Result Date: 01/29/2022 CLINICAL DATA:  Diabetic foot ulcer EXAM: MRI OF THE RIGHT FOREFOOT WITHOUT AND WITH CONTRAST TECHNIQUE: Multiplanar, multisequence MR imaging of the right foot was performed before and after the administration of intravenous contrast. CONTRAST:  52m GADAVIST GADOBUTROL 1 MMOL/ML IV SOLN COMPARISON:  Radiographs, same date. FINDINGS: There is an open wound noted along the lateral aspect of the fifth metatarsal head. Gas is noted in the soft tissues along the dorsum fifth. Elongated rim enhancing fluid collection tracking back along the fifth metatarsal shaft consistent a dissecting abscess. There are also areas of nonenhancing soft tissue worrisome for de vascularized/necrotic tissues. Small fifth MTP joint effusion and signal intensity in the fifth metatarsal head and neck and subsequent enhancement consistent with septic arthritis and osteomyelitis. Diffuse myofasciitis without definite findings for pyomyositis. The other bony structures are intact. IMPRESSION: 1. Open wound along the lateral aspect of the fifth metatarsal head with underlying abscess, probable necrotic soft tissue and diffuse cellulitis and myofasciitis. 2. MR findings consistent with septic arthritis at the fifth MTP joint and associated osteomyelitis involving the fifth metatarsal head and neck. 3. Elongated rim enhancing fluid collection tracking back along the fifth metatarsal shaft consistent with a dissecting abscess. 4. Diffuse myofasciitis without definite findings for pyomyositis. Electronically Signed   By: PMarijo SanesM.D.   On: 01/29/2022 08:17   DG Foot Complete Right  Result Date: 01/28/2022 CLINICAL DATA:  Pain and swelling, open wound in the skin EXAM: RIGHT FOOT COMPLETE - 3+  VIEW COMPARISON:  None Available. FINDINGS: There are multiple pockets of air in the soft tissues adjacent to the fifth metatarsophalangeal joint. In the AP view, faint lucencies are noted in the head of the fifth metatarsal which could not be confirmed in the oblique projection. No recent displaced fracture or dislocation is seen. Extensive arterial calcifications are seen in soft tissues. There is soft tissue swelling over the dorsum of the foot. IMPRESSION: There are pockets of air in the soft tissues adjacent to the right fifth metatarsophalangeal joint suggesting possible infectious process. Possible small lucencies are seen in the adjacent bony structures. Possibility of osteomyelitis in the head of fifth metatarsal and base of proximal phalanx of right fifth toe is not excluded. If clinically warranted, follow-up MRI or three-phase bone scan may be considered. Electronically Signed   By: PElmer PickerM.D.   On: 01/28/2022 13:30   DG Chest 2 View  Result Date: 01/28/2022 CLINICAL DATA:  Possible sepsis EXAM: CHEST - 2 VIEW COMPARISON:  09/17/2011 FINDINGS: The heart size and mediastinal contours are  within normal limits. Both lungs are clear. The visualized skeletal structures are unremarkable. IMPRESSION: No active cardiopulmonary disease. Electronically Signed   By: Elmer Picker M.D.   On: 01/28/2022 13:26    Microbiology: Results for orders placed or performed during the hospital encounter of 01/28/22  Culture, blood (Routine x 2)     Status: Abnormal   Collection Time: 01/28/22 12:51 PM   Specimen: BLOOD  Result Value Ref Range Status   Specimen Description   Final    BLOOD LEFT ANTECUBITAL Performed at [facility], [address] [place], [place] [address]    Special Requests   Final    BOTTLES DRAWN AEROBIC AND ANAEROBIC Blood Culture adequate volume Performed at [facility], [address], [place], [place] [address]    Culture  Setup Time   Final     GRAM POSITIVE COCCI ANAEROBIC BOTTLE ONLY CRITICAL RESULT CALLED TO, READ BACK BY AND VERIFIED WITH: WILL ANDERSON ON 02/01/22 AT 3810 QSD Performed at [facility], [place] [address] [place], [place] [address]    Culture (A)  Final    PEPTOSTREPTOCOCCUS ASACCHAROLYTICUS STREPTOCOCCUS AGALACTIAE    Report Status 02/05/2022 FINAL  Final   Organism ID, Bacteria STREPTOCOCCUS AGALACTIAE  Final      Susceptibility   Streptococcus agalactiae - MIC*    CLINDAMYCIN <=0.25 SENSITIVE Sensitive     AMPICILLIN <=0.25 SENSITIVE Sensitive     ERYTHROMYCIN <=0.12 SENSITIVE Sensitive     VANCOMYCIN 0.5 SENSITIVE Sensitive     CEFTRIAXONE <=0.12 SENSITIVE Sensitive     LEVOFLOXACIN 1 SENSITIVE Sensitive     PENICILLIN Value in next row Sensitive      SENSITIVEMIC =0.06S    * STREPTOCOCCUS AGALACTIAE  Blood Culture ID Panel (Reflexed)     Status: None   Collection Time: 01/28/22 12:51 PM  Result Value Ref Range Status   Enterococcus faecalis NOT DETECTED NOT DETECTED Final   Enterococcus Faecium NOT DETECTED NOT DETECTED Final   Listeria monocytogenes NOT DETECTED NOT DETECTED Final   Staphylococcus species NOT DETECTED NOT DETECTED Final   Staphylococcus aureus (BCID) NOT DETECTED NOT DETECTED Final   Staphylococcus epidermidis NOT DETECTED NOT DETECTED Final   Staphylococcus lugdunensis NOT DETECTED NOT DETECTED Final   Streptococcus species NOT DETECTED NOT DETECTED Final   Streptococcus agalactiae NOT DETECTED NOT DETECTED Final   Streptococcus pneumoniae NOT DETECTED NOT DETECTED Final   Streptococcus pyogenes NOT DETECTED NOT DETECTED Final   A.calcoaceticus-baumannii NOT DETECTED NOT DETECTED Final   Bacteroides fragilis NOT DETECTED NOT DETECTED Final   Enterobacterales NOT DETECTED NOT DETECTED Final   Enterobacter cloacae complex NOT DETECTED NOT DETECTED Final   Escherichia coli NOT DETECTED NOT DETECTED Final   Klebsiella aerogenes NOT DETECTED NOT DETECTED Final    Klebsiella oxytoca NOT DETECTED NOT DETECTED Final   Klebsiella pneumoniae NOT DETECTED NOT DETECTED Final   Proteus species NOT DETECTED NOT DETECTED Final   Salmonella species NOT DETECTED NOT DETECTED Final   Serratia marcescens NOT DETECTED NOT DETECTED Final   Haemophilus influenzae NOT DETECTED NOT DETECTED Final   Neisseria meningitidis NOT DETECTED NOT DETECTED Final   Pseudomonas aeruginosa NOT DETECTED NOT DETECTED Final   Stenotrophomonas maltophilia NOT DETECTED NOT DETECTED Final   Candida albicans NOT DETECTED NOT DETECTED Final   Candida auris NOT DETECTED NOT DETECTED Final   Candida glabrata NOT DETECTED NOT DETECTED Final   Candida krusei NOT DETECTED NOT DETECTED Final   Candida parapsilosis NOT DETECTED  NOT DETECTED Final   Candida tropicalis NOT DETECTED NOT DETECTED Final   Cryptococcus neoformans/gattii NOT DETECTED NOT DETECTED Final    Comment: Performed at [facility], [address] [place], [place] [address]  Culture, blood (Routine x 2)     Status: None   Collection Time: 01/28/22  3:37 PM   Specimen: BLOOD  Result Value Ref Range Status   Specimen Description BLOOD BLOOD LEFT HAND  Final   Special Requests   Final    BOTTLES DRAWN AEROBIC AND ANAEROBIC Blood Culture results may not be optimal due to an inadequate volume of blood received in culture bottles   Culture   Final    NO GROWTH 5 DAYS Performed at [facility], [address], [place], [place] [address]    Report Status 02/02/2022 FINAL  Final  Surgical pcr screen     Status: None   Collection Time: 01/30/22  1:35 AM   Specimen: Nasal Mucosa; Nasal Swab  Result Value Ref Range Status   MRSA, PCR NEGATIVE NEGATIVE Final   Staphylococcus aureus NEGATIVE NEGATIVE Final    Comment: (NOTE) The Xpert SA Assay (FDA approved for NASAL specimens in patients 24 years of age and older), is one component of a comprehensive surveillance program. It is not intended to diagnose  infection nor to guide or monitor treatment. Performed at [facility], [address] [place], [place] [address]   Aerobic/Anaerobic Culture w Gram Stain (surgical/deep wound)     Status: None   Collection Time: 01/30/22  1:35 AM   Specimen: Foot; Wound  Result Value Ref Range Status   Specimen Description   Final    FOOT RIGTH Performed at [facility], [address], [place], [place] [address]    Special Requests   Final    NONE Performed at [facility], [address] [place], [place] [address]    Gram Stain   Final    NO SQUAMOUS EPITHELIAL CELLS SEEN FEW WBC SEEN MODERATE GRAM POSITIVE COCCI    Culture   Final    FEW PROTEUS MIRABILIS FEW STAPHYLOCOCCUS AUREUS NO ANAEROBES ISOLATED Performed at [facility], [place] [address], [place], [place] [address]    Report Status 02/04/2022 FINAL  Final   Organism ID, Bacteria PROTEUS MIRABILIS  Final   Organism ID, Bacteria STAPHYLOCOCCUS AUREUS  Final      Susceptibility   Proteus mirabilis - MIC*    AMPICILLIN >=32 RESISTANT Resistant     CEFAZOLIN 8 SENSITIVE Sensitive     CEFEPIME <=0.12 SENSITIVE Sensitive     CEFTAZIDIME <=1 SENSITIVE Sensitive     CEFTRIAXONE <=0.25 SENSITIVE Sensitive     CIPROFLOXACIN <=0.25 SENSITIVE Sensitive     GENTAMICIN <=1 SENSITIVE Sensitive     IMIPENEM 2 SENSITIVE Sensitive     TRIMETH/SULFA >=320 RESISTANT Resistant     AMPICILLIN/SULBACTAM 16 INTERMEDIATE Intermediate     PIP/TAZO <=4 SENSITIVE Sensitive     * FEW PROTEUS MIRABILIS   Staphylococcus aureus - MIC*    CIPROFLOXACIN <=0.5 SENSITIVE Sensitive     ERYTHROMYCIN <=0.25 SENSITIVE Sensitive     GENTAMICIN <=0.5 SENSITIVE Sensitive     OXACILLIN <=0.25 SENSITIVE Sensitive     TETRACYCLINE <=1 SENSITIVE Sensitive     VANCOMYCIN <=0.5 SENSITIVE Sensitive     TRIMETH/SULFA <=10 SENSITIVE Sensitive     CLINDAMYCIN <=0.25 SENSITIVE Sensitive     RIFAMPIN <=0.5 SENSITIVE Sensitive      Inducible Clindamycin  NEGATIVE Sensitive     * FEW STAPHYLOCOCCUS AUREUS  Aerobic/Anaerobic Culture w Gram Stain (surgical/deep wound)     Status: None   Collection Time: 01/30/22  1:19 PM   Specimen: PATH Other; Tissue  Result Value Ref Range Status   Specimen Description   Final    WOUND Performed at [facility], [address], [place], [place] [address]    Special Requests   Final    RIGHT 5TH METATARSAL Performed at [facility], [address] [place], [place] [address]    Gram Stain   Final    FEW WBC PRESENT, PREDOMINANTLY PMN NO ORGANISMS SEEN    Culture   Final    RARE PROTEUS MIRABILIS SUSCEPTIBILITIES PERFORMED ON PREVIOUS CULTURE WITHIN THE LAST 5 DAYS. NO ANAEROBES ISOLATED Performed at [facility], [place] [address], [place], [place] [address]    Report Status 02/04/2022 FINAL  Final  Aerobic Culture w Gram Stain (superficial specimen)     Status: None (Preliminary result)   Collection Time: 02/03/22  6:24 PM   Specimen: Toe; Wound  Result Value Ref Range Status   Specimen Description   Final    TOE Performed at [facility], [address] [place], [place] [address]    Special Requests   Final    NONE Performed at [facility], [address] [place], [place] [address]    Gram Stain   Final    NO SQUAMOUS EPITHELIAL CELLS SEEN NO WBC SEEN NO ORGANISMS SEEN    Culture   Final    NO GROWTH 2 DAYS Performed at [facility], [place] [address], [place], [place] [address]    Report Status PENDING  Incomplete    Labs: CBC: Recent Labs  Lab 02/02/22 0737  WBC 11.9*  NEUTROABS 7.7  HGB 13.9  HCT 40.7  MCV 87.9  PLT 785   Basic Metabolic Panel: Recent Labs  Lab 01/31/22 0530 02/02/22 0737  NA  --  134*  K  --  3.9  CL  --  103  CO2  --  23  GLUCOSE  --  107*  BUN  --  18  CREATININE 0.63 0.67  CALCIUM  --  9.4   Liver Function Tests: No results for input(s): "AST", "ALT", "ALKPHOS",  "BILITOT", "PROT", "ALBUMIN" in the last 168 hours. CBG: Recent Labs  Lab 02/04/22 1753 02/04/22 2125 02/05/22 0802 02/05/22 1119 02/05/22 1157  GLUCAP 88 127* 111* 133* 132*    Discharge time spent: greater than 30 minutes.  Signed: Jennye Boroughs, MD [facility] 02/05/2022

## 2022-02-05 NOTE — Plan of Care (Signed)
Patient discharged per MD orders at this time.All discharge instructions,education and medications reviewed with the patient.Pt expressed understanding and will comply with dc instructions.follow up appointments was also communicated to the patient.no verbal c/o or any ssx of distress.Pt was discharged home with HH/Nursing infusion therapy services per order.Pt was transported home by brother in a privately owned vehicle.

## 2022-02-05 NOTE — Plan of Care (Signed)
  Problem: Fluid Volume: Goal: Hemodynamic stability will improve Outcome: Progressing   Problem: Clinical Measurements: Goal: Signs and symptoms of infection will decrease Outcome: Progressing   Problem: Respiratory: Goal: Ability to maintain adequate ventilation will improve Outcome: Progressing   Problem: Education: Goal: Knowledge of General Education information will improve Description: Including pain rating scale, medication(s)/side effects and non-pharmacologic comfort measures Outcome: Progressing   Problem: Health Behavior/Discharge Planning: Goal: Ability to manage health-related needs will improve Outcome: Progressing   Problem: Clinical Measurements: Goal: Ability to maintain clinical measurements within normal limits will improve Outcome: Progressing   Problem: Clinical Measurements: Goal: Will remain free from infection Outcome: Progressing   Problem: Clinical Measurements: Goal: Diagnostic test results will improve Outcome: Progressing   Problem: Clinical Measurements: Goal: Respiratory complications will improve Outcome: Progressing   Problem: Clinical Measurements: Goal: Cardiovascular complication will be avoided Outcome: Progressing   Problem: Activity: Goal: Risk for activity intolerance will decrease Outcome: Progressing   Problem: Nutrition: Goal: Adequate nutrition will be maintained Outcome: Progressing   Problem: Coping: Goal: Level of anxiety will decrease Outcome: Progressing   Problem: Elimination: Goal: Will not experience complications related to bowel motility Outcome: Progressing   Problem: Elimination: Goal: Will not experience complications related to urinary retention Outcome: Progressing   Problem: Pain Managment: Goal: General experience of comfort will improve Outcome: Progressing   Problem: Safety: Goal: Ability to remain free from injury will improve Outcome: Progressing   Problem: Skin Integrity: Goal:  Risk for impaired skin integrity will decrease Outcome: Progressing   Problem: Fluid Volume: Goal: Ability to maintain a balanced intake and output will improve Outcome: Progressing   Problem: Health Behavior/Discharge Planning: Goal: Ability to identify and utilize available resources and services will improve Outcome: Progressing   Problem: Metabolic: Goal: Ability to maintain appropriate glucose levels will improve Outcome: Progressing   Problem: Nutritional: Goal: Maintenance of adequate nutrition will improve Outcome: Progressing   Problem: Tissue Perfusion: Goal: Adequacy of tissue perfusion will improve Outcome: Progressing

## 2022-02-05 NOTE — Progress Notes (Signed)
Peripherally Inserted Central Catheter Placement  The IV Nurse has discussed with the patient and/or persons authorized to consent for the patient, the purpose of this procedure and the potential benefits and risks involved with this procedure.  The benefits include less needle sticks, lab draws from the catheter, and the patient may be discharged home with the catheter. Risks include, but not limited to, infection, bleeding, blood clot (thrombus formation), and puncture of an artery; nerve damage and irregular heartbeat and possibility to perform a PICC exchange if needed/ordered by physician.  Alternatives to this procedure were also discussed.  Bard [facility] PICC patient education guide, fact sheet on infection prevention and patient information card has been provided to patient /or left at bedside.    PICC Placement Documentation  PICC Single Lumen 02/05/22 Right Brachial 41 cm 0 cm (Active)  Indication for Insertion or Continuance of Line Home intravenous therapies (PICC only) 02/05/22 0941  Exposed Catheter (cm) 0 cm 02/05/22 0941  Site Assessment Clean, Dry, Intact 02/05/22 0941  Line Status Flushed;Saline locked;Blood return noted 02/05/22 0941  Dressing Type Transparent;Securing device 02/05/22 0941  Dressing Status Antimicrobial disc in place 02/05/22 0941  Dressing Intervention New dressing;Other (Comment) 02/05/22 0941  Dressing Change Due 02/12/22 02/05/22 0941       Daniel Ritter 02/05/2022, 9:42 AM

## 2022-02-05 NOTE — Progress Notes (Signed)
PHARMACY CONSULT NOTE FOR:  OUTPATIENT  PARENTERAL ANTIBIOTIC THERAPY (OPAT)  Indication: Diabetic foot osteomyelitis and Grp B streptococcus/peptostreptococcus bacteremia Regimen: piperacillin/tazobactam 13.5gm daily infused as continuous infusion End date: 02/25/2022  Labs - Once weekly:  CBC/D and CMP, ESR and CRP Please pull PIC at completion of IV antibiotics Fax weekly lab results  promptly to [phone]  IV antibiotic discharge orders are pended. To discharging provider:  please sign these orders via discharge navigator,  Select New Orders & click on the button choice - Manage This Unsigned Work.     Thank you for allowing pharmacy to be a part of this patient's care.  Doreene Eland, PharmD, BCPS, BCIDP Work Cell: [phone] 02/05/2022 10:04 AM

## 2022-02-06 LAB — AEROBIC CULTURE W GRAM STAIN (SUPERFICIAL SPECIMEN)
Culture: NO GROWTH
Gram Stain: NONE SEEN

## 2022-02-10 LAB — CULTURE, BLOOD (ROUTINE X 2)
Culture: NO GROWTH
Culture: NO GROWTH
Special Requests: ADEQUATE

## 2022-02-17 ENCOUNTER — Other Ambulatory Visit: Payer: Self-pay

## 2022-02-17 ENCOUNTER — Telehealth: Payer: Self-pay

## 2022-02-17 ENCOUNTER — Other Ambulatory Visit: Payer: Self-pay | Admitting: Infectious Diseases

## 2022-02-17 DIAGNOSIS — T85868A Thrombosis due to other internal prosthetic devices, implants and grafts, initial encounter: Secondary | ICD-10-CM

## 2022-02-17 MED ORDER — ALTEPLASE 2 MG IJ SOLR: 2.0000 mg | Freq: Once | INTRAMUSCULAR | Status: DC

## 2022-02-17 MED ORDER — AMOXICILLIN-POT CLAVULANATE 875-125 MG PO TABS: 1.0000 | ORAL_TABLET | Freq: Two times a day (BID) | ORAL | 0 refills | Status: DC

## 2022-02-17 NOTE — Progress Notes (Signed)
PT has PICC line for IV antibiotic and that is not flushing since Sunday- arranged for cathflo flush tomorrow at day surgery- order placed- meanwhile bridge with Augmentin for foot infection. Discussed with care team

## 2022-02-17 NOTE — Telephone Encounter (Signed)
Daniel Ritter with [facility] [phone]) called stating the patient's picc line will need to be declotted. Per [employer] will not cover cath flo in the home. Patient has not been able to infuse IV antibiotics since Saturday.  I spoke to the patient and advised him of appointment with  [facility] at 1 pm for Cath Flo and given him instructions where he needs to check in at. Dr. Delaine Lame has placed orders as well. Augmentin 875 mg po BID x 3 days has also been sent in for the patient to start taking today per Dr. Gwenevere Ghazi orders and patient informed.  I spoke to Gracey as well and advised her that patient will get Cath [facility] tomorrow. Daniel Ritter

## 2022-02-18 ENCOUNTER — Ambulatory Visit
Admission: RE | Admit: 2022-02-18 | Discharge: 2022-02-18 | Disposition: A | Discharge status: Home / Self Care | Payer: Medicaid Other | Source: Ambulatory Visit | Attending: Infectious Diseases | Admitting: Infectious Diseases

## 2022-02-18 DIAGNOSIS — Y828 Other medical devices associated with adverse incidents: Secondary | ICD-10-CM | POA: Diagnosis not present

## 2022-02-18 DIAGNOSIS — T85868A Thrombosis due to other internal prosthetic devices, implants and grafts, initial encounter: Secondary | ICD-10-CM | POA: Diagnosis not present

## 2022-02-18 DIAGNOSIS — Z452 Encounter for adjustment and management of vascular access device: Secondary | ICD-10-CM | POA: Diagnosis present

## 2022-02-18 MED ORDER — ALTEPLASE 2 MG IJ SOLR
2.0000 mg | Freq: Once | INTRAMUSCULAR | Status: AC
  Administered 2022-02-18: 2 mg
  Filled 2022-02-18 (×2): qty 2

## 2022-02-18 MED ORDER — STERILE WATER FOR INJECTION IJ SOLN
INTRAMUSCULAR | Status: AC
  Filled 2022-02-18: qty 10

## 2022-02-20 ENCOUNTER — Ambulatory Visit: Payer: Medicaid Other | Attending: Infectious Diseases | Admitting: Infectious Diseases

## 2022-02-20 ENCOUNTER — Encounter: Payer: Self-pay | Admitting: Infectious Diseases

## 2022-02-20 VITALS — BP 108/74 | HR 89 | Temp 97.8°F

## 2022-02-20 DIAGNOSIS — E11628 Type 2 diabetes mellitus with other skin complications: Secondary | ICD-10-CM | POA: Insufficient documentation

## 2022-02-20 DIAGNOSIS — L089 Local infection of the skin and subcutaneous tissue, unspecified: Secondary | ICD-10-CM | POA: Insufficient documentation

## 2022-02-20 DIAGNOSIS — Z7984 Long term (current) use of oral hypoglycemic drugs: Secondary | ICD-10-CM

## 2022-02-20 DIAGNOSIS — B999 Unspecified infectious disease: Secondary | ICD-10-CM | POA: Insufficient documentation

## 2022-02-20 MED ORDER — AMOXICILLIN-POT CLAVULANATE 875-125 MG PO TABS: 1.0000 | ORAL_TABLET | Freq: Two times a day (BID) | ORAL | 0 refills | Status: DC

## 2022-02-20 NOTE — Patient Instructions (Signed)
You are here for follow up of foot infection- you are doing well on zosyn- you will complete on 8/22 and PICC will be removed- we can put you on PO augmentin for a week or so after that

## 2022-02-20 NOTE — Progress Notes (Signed)
[name]  DOB: Oct 27, 1975  MRN: [id number]  Date/Time: 02/20/2022 10:23 AM   Subjective:   ? Daniel Ritter is a 46 y.o. with a history of Dm was recently in [facility] between 7/25-02/05/22 for b/l foot infection and poorly controlled DM, HTNm  past h/o alcohol use, pancreatitis, seizure disorder He had sepsis from foot infection and found to have wound, osteomyleitis of the rt 5th toe and osteo of the 2nd left toe HE underwent on 01/30/22  Excision second metatarsophalangeal joint left foot 2.  Partial fifth ray amputation right foot 3.  Full-thickness excisional ulcer debridement to tendon plantar left foot 4.  Full-thickness ulcer debridement to subcutaneous tissue plantar right foot He was initially on broad spectrum antibiotic which was de-escalated to unasyn Wound culture proteus and MSSA Blood culture peptostreptococcus and GBS He had vascular study and circulation was fine He was discharged home with PICC an total of 5 weeks of IV zosyn to finish on 03/02/22 with plan to give PO Augmentin after that HE is here for follow up He has ben careles swith how he takes his dresisng off and has ripped his skin Also stubbed the rt 2nd toe and has a small hematoma HE says he is doing IV antibiotic every day- has 24 hr infusion HE had to come to day surgery on Tuesday fr cath flo to unblock the PICC line- he was bridged with PO augmentin for 3 days Past Medical History:  Diagnosis Date   Allergy    seasonal   Anxiety    Diabetes mellitus without complication ([place])    GERD (gastroesophageal reflux disease)    H/O ETOH abuse    History of rectal abscess    Hypertension 2007   Neuropathy    Pancreatitis    history of, pt states due to alcohol   Seizures ([place]) 08/2014   " two seizures in the hospital"   Stroke [facility]) 08/31/2013   Thrombocytopenia ([place])     Past Surgical History:  Procedure Laterality Date   AMPUTATION TOE Right 01/30/2022   Procedure: PARTIAL 5TH RAY AMUPTATION  RIGHT FOOT;  Surgeon: Samara Deist, DPM;  Location: [facility] OR[facility];  Service: Podiatry;  Laterality: Right;   ANTERIOR CRUCIATE LIGAMENT REPAIR Left 1992, 1993   COLONOSCOPY N/A 09/04/2021   Procedure: COLONOSCOPY;  Surgeon: Toledo, Benay Pike, MD;  Location: [facility] END[facility];  Service: Gastroenterology;  Laterality: N/A;  DM   INCISION AND DRAINAGE PERIRECTAL ABSCESS N/A 12/14/2015   Procedure: IRRIGATION AND DEBRIDEMENT PERIRECTAL ABSCESS;  Surgeon: Christene Lye, MD;  Location: [facility] OR[facility];  Service: General;  Laterality: N/A;   IRRIGATION AND DEBRIDEMENT FOOT Bilateral 01/30/2022   Procedure: IRRIGATION AND DEBRIDEMENT TENDON ULCER LEFT FOOT,  EXCISION OF 2ND TOE JOINT LEFT FOOT, IRRIGATION AND DEBRIDEMENT SUBCUTANEOUS ULCER RIGHT FOOT;  Surgeon: Samara Deist, DPM;  Location: [facility] OR[facility];  Service: Podiatry;  Laterality: Bilateral;   LOWER EXTREMITY ANGIOGRAPHY Right 02/04/2022   Procedure: Lower Extremity Angiography;  Surgeon: Katha Cabal, MD;  Location: [facility];  Service: Cardiovascular;  Laterality: Right;    Social History   Socioeconomic History   Marital status: Divorced    Spouse name: Not on file   Number of children: 0   Years of education: 14   Highest education level: Not on file  Occupational History   Occupation: Librarian, academic    Employer: [employer]   Occupation: diabled  Tobacco Use   Smoking status: Former    Packs/day: 0.25  Years: 18.00    Total pack years: 4.50    Types: Cigarettes    Quit date: 04/11/2020    Years since quitting: 1.8   Smokeless tobacco: Former    Types: Snuff    Quit date: 2001   Tobacco comments:    patient quit for 3 years and then started back . 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 Current Outpatient Medications  Medication Sig Dispense Refill   acetaminophen (TYLENOL) 325 MG tablet Take 2 tablets (650 mg total) by mouth every 6 (six) hours as needed for mild pain (or Fever >/= 101).     ALPRAZolam (XANAX) 0.25 MG tablet Take 0.25 mg by mouth 2 (two) times daily.     amLODipine (NORVASC) 10 MG tablet Take 10 mg by mouth daily as needed.     DULoxetine (CYMBALTA) 30 MG capsule Take 30 mg by mouth daily.     JANUVIA 100 MG tablet Take 100 mg by mouth daily.     levETIRAcetam (KEPPRA) 500 MG tablet Take 1 tablet (500 mg total) by mouth 2 (two) times daily. 30 tablet 2   lisinopril-hydrochlorothiazide (PRINZIDE,ZESTORETIC) 20-12.5 MG per tablet Take 1 tablet by mouth daily.     metFORMIN (GLUCOPHAGE) 1000 MG tablet Take 1,000 mg by mouth 2 (two) times daily with a meal.     metoprolol succinate (TOPROL-XL) 100 MG 24 hr tablet Take 100 mg by mouth at bedtime. Take with or immediately following a meal.     omeprazole (PRILOSEC) 20 MG capsule Take 20 mg by mouth 2 (two) times daily before a meal.     piperacillin-tazobactam (ZOSYN) IVPB Inject 13.5 g into the vein continuous for 20 days. Infuse piperacillin/tazobactam  13.5gm IV over 24hr as continuous infusion Indication:  diabetic foot osteomyelitis and Grp B strep/peptostreptococcus bacteremia First Dose: Yes Last Day of Therapy:  02/25/2022 Labs - Once weekly:  CBC/D and CMP, ESR and CRP Please pull PIC at completion of IV antibiotics Fax weekly lab results  promptly to [phone] Method of administration: Elastomeric (Continuous infusion) Method of administration may be changed at the discretion of home infusion pharmacist based upon assessment of the patient and/or caregiver's ability to self-administer the medication ordered. 21 Units 0   Current Facility-Administered Medications  Medication Dose  Route Frequency Provider Last Rate Last Admin   alteplase (CATHFLO ACTIVASE) injection 2 mg  2 mg Intracatheter Once Tsosie Billing, MD         Abtx:  Anti-infectives (From admission, onward)    None       REVIEW OF SYSTEMS:  Const: negative fever, negative chills, negative weight loss Eyes: negative diplopia or visual changes, negative eye pain ENT: negative coryza, negative sore throat Resp: negative cough, hemoptysis, dyspnea Cards: negative for chest pain, palpitations, lower extremity edema GU: negative for frequency, dysuria and hematuria GI: Negative for abdominal pain, diarrhea, bleeding, constipation Skin: negative for rash and pruritus Heme: negative for easy bruising and gum/nose bleeding MS: negative for myalgias, arthralgias, back pain and muscle weakness Neurolo:negative for headaches, dizziness, vertigo, memory problems  Psych: negative for feelings of anxiety, depression  Endocrine:  diabetes Allergy/Immunology- negative for any medication or food allergies ? VITALS:  BP 108/74   Pulse 89   Temp 97.8 F (36.6 C) (Temporal)  LDA Rt PICC PHYSICAL EXAM:  General: Alert, cooperative, no distress, appears stated age.  Head: Normocephalic, without obvious abnormality, atraumatic. Lungs: Clear to auscultation  bilaterally. No Wheezing or Rhonchi. No rales. Heart: Regular rate and rhythm, no murmur, rub or gallop. Extremities:            Skin: some superficial tears of the skin both dorsum Lymph: Cervical, supraclavicular normal. Neurologic: Grossly non-focal Pertinent Labs 02/17/22 Cr 0.70 Ca 11 ( was 9.4 in the hospital) Wbc 13.7 Esr 103 CRP 4   ? Impression/Recommendation Diabetic infection of both feet with acute osteomyelitis Rt 5th toe partial ray excision- healed well- sutures to be removed  There is a full thickness  ulcer- healing well Proteus and MSSA in culture   Left 2 nd toe infection S/p excision 2nd MTP joint - acute osteo but margin clear Full thickness ulcer to tendon on the plantar surface- s/p debridement No culture sent during surgery That is also healing well  HE has some new skin tears and stubbed his toe as well He is careless with his dressing and how he removes it I am also worried that he may not follow sterile precautions while fluisng or maintaining line He had a block in the PICC and it had to be opened with cath flo recently It would be better to get him ton PO antibiotic soon  Group B streptococcus and peptostreptococcus bacteremia - source is the foot infection- resolved Continue zosyn until 8/22 when he will finish 4weeks and then will  go on PO augmentin for 2 weeks      DM on insulin No PAD   HTN ? ? ___________________________________________________ Discussed the management with aptient in detail Follow up with Podiatrist Follow up with me in few weeks Note:  This document was prepared using Dragon voice recognition software and may include unintentional dictation errors.

## 2022-03-13 ENCOUNTER — Ambulatory Visit: Payer: Medicaid Other | Admitting: Infectious Diseases

## 2022-03-18 ENCOUNTER — Encounter: Payer: Self-pay | Admitting: Infectious Diseases

## 2022-03-18 ENCOUNTER — Other Ambulatory Visit
Admission: RE | Admit: 2022-03-18 | Discharge: 2022-03-18 | Disposition: A | Discharge status: Home / Self Care | Payer: Medicaid Other | Source: Ambulatory Visit | Attending: Infectious Diseases | Admitting: Infectious Diseases

## 2022-03-18 ENCOUNTER — Ambulatory Visit: Payer: Medicaid Other | Attending: Infectious Diseases | Admitting: Infectious Diseases

## 2022-03-18 VITALS — BP 111/81 | HR 88 | Temp 97.0°F

## 2022-03-18 DIAGNOSIS — B999 Unspecified infectious disease: Secondary | ICD-10-CM | POA: Insufficient documentation

## 2022-03-18 DIAGNOSIS — E11628 Type 2 diabetes mellitus with other skin complications: Secondary | ICD-10-CM | POA: Insufficient documentation

## 2022-03-18 DIAGNOSIS — I10 Essential (primary) hypertension: Secondary | ICD-10-CM | POA: Diagnosis not present

## 2022-03-18 DIAGNOSIS — Z7984 Long term (current) use of oral hypoglycemic drugs: Secondary | ICD-10-CM | POA: Diagnosis not present

## 2022-03-18 DIAGNOSIS — L089 Local infection of the skin and subcutaneous tissue, unspecified: Secondary | ICD-10-CM | POA: Diagnosis not present

## 2022-03-18 LAB — C-REACTIVE PROTEIN: CRP: 0.6 mg/dL (ref <1.0)

## 2022-03-18 LAB — SEDIMENTATION RATE: Sed Rate: 33 mm/hr — ABNORMAL HIGH (ref 0–16)

## 2022-03-18 NOTE — Patient Instructions (Signed)
You are here for for follow up of both feet infections- the surgical site has healed very well- the skin tears are also much better- you have finished 6 weeks of antibiotic- you dont need any more antibiotics- follow up with me as needed- follow up with Dr.Fowler

## 2022-03-18 NOTE — Progress Notes (Signed)
youNAMEBUEFORD ARP  DOB: Feb 18, 1976  MRN: [id number]  Date/Time: 03/18/2022 12:05 PM   Subjective:   ?here for follow up visit. Last seen 3 weeks ago for diabetic foot infection b/l- doing very well- has completed 6 weeks of antibiotics PICC line was removed 3 weeks ago and he took augmentin for 2 weeks after that  Daniel Ritter is a 46 y.o. with a history of Dm was recently in [facility] between 7/25-02/05/22 for b/l foot infection and poorly controlled DM, HTNm  past h/o alcohol use, pancreatitis, seizure disorder He had sepsis from foot infection and found to have wound, osteomyleitis of the rt 5th toe and osteo of the 2nd left toe HE underwent on 01/30/22  Excision second metatarsophalangeal joint left foot 2.  Partial fifth ray amputation right foot 3.  Full-thickness excisional ulcer debridement to tendon plantar left foot 4.  Full-thickness ulcer debridement to subcutaneous tissue plantar right foot He was initially on broad spectrum antibiotic which was de-escalated to unasyn Wound culture proteus and MSSA Blood culture peptostreptococcus and GBS He had vascular study and circulation was fine  Past Medical History:  Diagnosis Date   Allergy    seasonal   Anxiety    Diabetes mellitus without complication (HCC)    GERD (gastroesophageal reflux disease)    H/O ETOH abuse    History of rectal abscess    Hypertension 2007   Neuropathy    Pancreatitis    history of, pt states due to alcohol   Seizures ([place]) 08/2014   " two seizures in the hospital"   Stroke [facility]) 08/31/2013   Thrombocytopenia ([place])     Past Surgical History:  Procedure Laterality Date   AMPUTATION TOE Right 01/30/2022   Procedure: PARTIAL 5TH RAY AMUPTATION RIGHT FOOT;  Surgeon: Ritter Deist, DPM;  Location: [facility];  Service: Podiatry;  Laterality: Right;   ANTERIOR CRUCIATE LIGAMENT REPAIR Left 1992, 1993   COLONOSCOPY N/A 09/04/2021   Procedure: COLONOSCOPY;  Surgeon: Toledo, Benay Pike, MD;  Location:  [facility];  Service: Gastroenterology;  Laterality: N/A;  DM   INCISION AND DRAINAGE PERIRECTAL ABSCESS N/A 12/14/2015   Procedure: IRRIGATION AND DEBRIDEMENT PERIRECTAL ABSCESS;  Surgeon: Christene Lye, MD;  Location: [facility];  Service: General;  Laterality: N/A;   IRRIGATION AND DEBRIDEMENT FOOT Bilateral 01/30/2022   Procedure: IRRIGATION AND DEBRIDEMENT TENDON ULCER LEFT FOOT,  EXCISION OF 2ND TOE JOINT LEFT FOOT, IRRIGATION AND DEBRIDEMENT SUBCUTANEOUS ULCER RIGHT FOOT;  Surgeon: Ritter Deist, DPM;  Location: [facility];  Service: Podiatry;  Laterality: Bilateral;   LOWER EXTREMITY ANGIOGRAPHY Right 02/04/2022   Procedure: Lower Extremity Angiography;  Surgeon: Ritter Cabal, MD;  Location: [facility];  Service: Cardiovascular;  Laterality: Right;    Social History   Socioeconomic History   Marital status: Divorced    Spouse name: Not on file   Number of children: 0   Years of education: 14   Highest education level: Not on file  Occupational History   Occupation: Buyer, retail: [employer]   Occupation: diabled  Tobacco Use   Smoking status: Former    Packs/day: 0.25    Years: 18.00    Total pack years: 4.50    Types: Cigarettes    Quit date: 04/11/2020    Years since quitting: 1.9   Smokeless tobacco: Former    Types: Snuff    Quit date: 2001   Tobacco comments:    patient quit for 3  years and then started back . 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 Current Outpatient Medications  Medication Sig Dispense Refill   acetaminophen (TYLENOL) 325 MG tablet Take 2 tablets (650 mg total) by mouth every 6 (six) hours as needed for mild pain (or Fever >/= 101).     ALPRAZolam (XANAX) 0.25 MG tablet Take 0.25 mg by mouth 2 (two) times daily.     amLODipine (NORVASC) 10 MG tablet Take 10 mg by mouth daily as needed.     amoxicillin-clavulanate (AUGMENTIN) 875-125 MG tablet Take 1 tablet by mouth 2 (two) times daily. 28 tablet 0   DULoxetine (CYMBALTA) 30 MG capsule Take 30 mg by mouth daily.     JANUVIA 100 MG tablet Take 100 mg by mouth daily.     levETIRAcetam (KEPPRA) 500 MG tablet Take 1 tablet (500 mg total) by mouth 2 (two) times daily. 30 tablet 2   lisinopril-hydrochlorothiazide (PRINZIDE,ZESTORETIC) 20-12.5 MG per tablet Take 1 tablet by mouth daily.     metFORMIN (GLUCOPHAGE) 1000 MG tablet Take 1,000 mg by mouth 2 (two) times daily with a meal.     metoprolol succinate (TOPROL-XL) 100 MG 24 hr tablet Take 100 mg by mouth at bedtime. Take with or immediately following a meal.     omeprazole (PRILOSEC) 20 MG capsule Take 20 mg by mouth 2 (two) times daily before a meal.     Current Facility-Administered Medications  Medication Dose Route Frequency Provider Last Rate Last Admin   alteplase (CATHFLO ACTIVASE) injection 2 mg  2 mg Intracatheter Once Tsosie Billing, MD         Abtx:  Anti-infectives (From admission, onward)     None       REVIEW OF SYSTEMS:  Const: negative fever, negative chills, negative weight loss Eyes: negative diplopia or visual changes, negative eye pain ENT: negative coryza, negative sore throat Resp: negative cough, hemoptysis, dyspnea Cards: negative for chest pain, palpitations, lower extremity edema GU: negative for frequency, dysuria and hematuria GI: Negative for abdominal pain, diarrhea, bleeding, constipation Skin: negative for rash and pruritus Heme: negative for easy bruising and gum/nose bleeding MS: negative for myalgias, arthralgias, back pain and muscle weakness Neurolo:negative for headaches, dizziness, vertigo, memory problems  Psych: negative for feelings of anxiety, depression  Endocrine:  diabetes, says well controlled Allergy/Immunology- negative for any medication or food allergies ? VITALS:  BP 111/81   Pulse 88   Temp (!) 97 F (36.1 C) (Temporal)   PHYSICAL EXAM:  General: looks well  Head: Normocephalic, without obvious abnormality, atraumatic. Lungs: Clear to auscultation bilaterally. No Wheezing or Rhonchi. No rales. Heart: Regular rate and rhythm, no murmur, rub or gallop. Extremities:   5th toe rt foot amputaiton site healed completely- no ulcer Left 2nd toe healed- no ulcer Dorsum rt foot skin tears healing very well No infection           Skin: some superficial tears of the skin both dorsum Lymph: Cervical, supraclavicular normal. Neurologic: Grossly non-focal Pertinent Labs 02/17/22 Cr 0.70 Ca 11 ( was 9.4 in the hospital) Wbc 13.7 Esr 103 CRP 4   ? Impression/Recommendation Diabetic infection of both feet with acute osteomyelitis Rt 5th toe partial ray excision- healed well- Proteus and MSSA in culture - treated for 6 weeks Left 2 nd toe infection S/p excision 2nd MTP joint - acute osteo but margin clear Full thickness ulcer to tendon on the plantar surface- s/p debridement Healed well  Group B streptococcus  and peptostreptococcus bacteremia - due to b/l toes infection- resolved Completed 4 weeks of IV zosyn and 2 weeks of augmentin - on 03/12/22 No more antibiotic needed   DM on insulin No PAD   HTN ? ? ___________________________________________________ Discussed the management with patient Follow up with Podiatrist Follow  PRN with me  P.S- esr done  today 03/18/22  is 33 ( was 109 on 02/05/22) CRP 0.6 was 2.4 on 02/05/22 Note:  This document was prepared using Dragon voice recognition software and may include unintentional dictation errors.

## 2022-03-19 ENCOUNTER — Telehealth: Payer: Self-pay

## 2022-03-19 NOTE — Telephone Encounter (Signed)
Patient aware of results and wanted to thank Dr.Ravishankarr.   [place], CMA

## 2022-03-19 NOTE — Telephone Encounter (Signed)
-----   Message from Tsosie Billing, MD sent at 03/19/2022 11:12 AM EDT ----- Please let him know labs look great. No more antibiotics- follow up with podiatrist ----- Message ----- From: Interface, Lab In [place] Sent: 03/18/2022   1:44 PM EDT To: Tsosie Billing, MD

## 2022-04-25 ENCOUNTER — Emergency Department
Admission: EM | Admit: 2022-04-25 | Discharge: 2022-04-25 | Discharge status: Against Medical Advice | Payer: Medicaid Other | Source: Home / Self Care

## 2022-04-29 ENCOUNTER — Emergency Department
Admission: EM | Admit: 2022-04-29 | Discharge: 2022-04-29 | Disposition: A | Discharge status: Home / Self Care | Payer: Medicaid Other | Attending: Emergency Medicine | Admitting: Emergency Medicine

## 2022-04-29 ENCOUNTER — Emergency Department: Payer: Medicaid Other

## 2022-04-29 ENCOUNTER — Other Ambulatory Visit: Payer: Self-pay

## 2022-04-29 DIAGNOSIS — E1169 Type 2 diabetes mellitus with other specified complication: Secondary | ICD-10-CM | POA: Insufficient documentation

## 2022-04-29 DIAGNOSIS — M869 Osteomyelitis, unspecified: Secondary | ICD-10-CM

## 2022-04-29 DIAGNOSIS — M79671 Pain in right foot: Secondary | ICD-10-CM | POA: Diagnosis present

## 2022-04-29 DIAGNOSIS — D72829 Elevated white blood cell count, unspecified: Secondary | ICD-10-CM | POA: Diagnosis not present

## 2022-04-29 DIAGNOSIS — M86171 Other acute osteomyelitis, right ankle and foot: Secondary | ICD-10-CM | POA: Diagnosis not present

## 2022-04-29 DIAGNOSIS — L97519 Non-pressure chronic ulcer of other part of right foot with unspecified severity: Secondary | ICD-10-CM | POA: Insufficient documentation

## 2022-04-29 LAB — COMPREHENSIVE METABOLIC PANEL
ALT: 15 U/L (ref 0–44)
AST: 27 U/L (ref 15–41)
Albumin: 4.2 g/dL (ref 3.5–5.0)
Alkaline Phosphatase: 94 U/L (ref 38–126)
Anion gap: 12 (ref 5–15)
BUN: 11 mg/dL (ref 6–20)
CO2: 21 mmol/L — ABNORMAL LOW (ref 22–32)
Calcium: 8.9 mg/dL (ref 8.9–10.3)
Chloride: 103 mmol/L (ref 98–111)
Creatinine, Ser: 0.62 mg/dL (ref 0.61–1.24)
GFR, Estimated: >60 mL/min (ref >60)
Glucose, Bld: 124 mg/dL — ABNORMAL HIGH (ref 70–99)
Potassium: 3.8 mmol/L (ref 3.5–5.1)
Sodium: 136 mmol/L (ref 135–145)
Total Bilirubin: 0.6 mg/dL (ref 0.3–1.2)
Total Protein: 8.1 g/dL (ref 6.5–8.1)

## 2022-04-29 LAB — CBC
HCT: 46.3 % (ref 39.0–52.0)
Hemoglobin: 15.2 g/dL (ref 13.0–17.0)
MCH: 29.5 pg (ref 26.0–34.0)
MCHC: 32.8 g/dL (ref 30.0–36.0)
MCV: 89.7 fL (ref 80.0–100.0)
Platelets: 115 10*3/uL — ABNORMAL LOW (ref 150–400)
RBC: 5.16 MIL/uL (ref 4.22–5.81)
RDW: 15.1 % (ref 11.5–15.5)
WBC: 12.2 10*3/uL — ABNORMAL HIGH (ref 4.0–10.5)
nRBC: 0 % (ref 0.0–0.2)

## 2022-04-29 LAB — CBG MONITORING, ED: Glucose-Capillary: 134 mg/dL — ABNORMAL HIGH (ref 70–99)

## 2022-04-29 NOTE — ED Triage Notes (Addendum)
Pt here with aright foot pain. Pt had surgery on his foot and had his pinky toe removed. Pt states an ulcer has formed and it was bleeding but then stopped. Pt is currently on abx. Pt states his doctor wanted him to get a MRI. Pt denies pain.

## 2022-04-29 NOTE — ED Provider Notes (Signed)
[facility] Provider Note    Event Date/Time   First MD Initiated Contact with Patient 04/29/22 1211     (approximate)   History   Foot Pain   HPI  Daniel Ritter is a 46 y.o. male with a history of diabetes who presents with complaints of ulceration to the right foot.  He reports his podiatrist has wanted him to come to the emergency department for MRI because of concern for osteomyelitis.  He denies fevers and reports he has been on p.o. antibiotics and his foot has become less red.  He reports his father is about to enter hospice care and he has a lot going on so he is not sure if he can stay in the hospital.  He does agreed to stay for MRI     Physical Exam   Triage Vital Signs: ED Triage Vitals  Enc Vitals Group     BP 04/29/22 1152 (!) 152/103     Pulse Rate 04/29/22 1152 94     Resp 04/29/22 1152 16     Temp 04/29/22 1152 97.7 F (36.5 C)     Temp Source 04/29/22 1152 Oral     SpO2 04/29/22 1152 99 %     Weight 04/29/22 1153 108.4 kg (238 lb 15.7 oz)     Height 04/29/22 1153 1.854 m ('6\' 1"'$ )     Head Circumference --      Peak Flow --      Pain Score 04/29/22 1153 0     Pain Loc --      Pain Edu? --      Excl. in [place]? --     Most recent vital signs: Vitals:   04/29/22 1152  BP: (!) 152/103  Pulse: 94  Resp: 16  Temp: 97.7 F (36.5 C)  SpO2: 99%     General: Awake, no distress.  CV:  Good peripheral perfusion.  Resp:  Normal effort.  Abd:  No distention.  Other:  Right foot lateral ulceration with some surrounding erythema and mild swelling   ED Results / Procedures / Treatments   Labs (all labs ordered are listed, but only abnormal results are displayed) Labs Reviewed  CBC - Abnormal; Notable for the following components:      Result Value   WBC 12.2 (*)    Platelets 115 (*)    All other components within normal limits  COMPREHENSIVE METABOLIC PANEL - Abnormal; Notable for the following components:   CO2 21 (*)     Glucose, Bld 124 (*)    All other components within normal limits  CBG MONITORING, ED - Abnormal; Notable for the following components:   Glucose-Capillary 134 (*)    All other components within normal limits     EKG     RADIOLOGY MRI demonstrates osteomyelitis of the residual fifth metatarsal    PROCEDURES:  Critical Care performed:   Procedures   MEDICATIONS ORDERED IN ED: Medications - No data to display   IMPRESSION / MDM / [place] / ED COURSE  I reviewed the triage vital signs and the nursing notes. Patient's presentation is most consistent with exacerbation of chronic illness.  Patient presents with ulceration as depicted above.  His white blood cell count is mildly elevated, there is concern for osteomyelitis by his podiatrist.  We will obtain MRI  MRI is consistent with osteomyelitis.  Discussed with patient that I recommend admission with IV antibiotics, podiatry consultation.  He states that  he is unable to be admitted at this time because of his father's sickness.  He reports he needs to go home and he will return either later tonight or early this morning  He understands the risk of worsening infection of the bone, amputation, sepsis  He is leaving [place]  He knows he can return anytime to be admitted      FINAL CLINICAL IMPRESSION(S) / ED DIAGNOSES   Final diagnoses:  Osteomyelitis of right foot, unspecified type ([place])     Rx / DC Orders   ED Discharge Orders     None        Note:  This document was prepared using Dragon voice recognition software and may include unintentional dictation errors.   [name], MD 04/29/22 [phone]

## 2022-05-01 ENCOUNTER — Observation Stay: Payer: Medicaid Other

## 2022-05-01 ENCOUNTER — Other Ambulatory Visit: Payer: Self-pay

## 2022-05-01 ENCOUNTER — Inpatient Hospital Stay: Payer: Medicaid Other | Admitting: General Practice

## 2022-05-01 ENCOUNTER — Encounter: Payer: Self-pay | Admitting: Emergency Medicine

## 2022-05-01 ENCOUNTER — Inpatient Hospital Stay
Admission: EM | Admit: 2022-05-01 | Discharge: 2022-05-03 | DRG: 629 | Disposition: A | Discharge status: Home / Self Care | Payer: Medicaid Other | Attending: Student in an Organized Health Care Education/Training Program | Admitting: Student in an Organized Health Care Education/Training Program

## 2022-05-01 ENCOUNTER — Encounter
Admission: EM | Disposition: A | Discharge status: Home / Self Care | Payer: Self-pay | Source: Home / Self Care | Attending: Student in an Organized Health Care Education/Training Program

## 2022-05-01 DIAGNOSIS — R569 Unspecified convulsions: Secondary | ICD-10-CM | POA: Diagnosis present

## 2022-05-01 DIAGNOSIS — B964 Proteus (mirabilis) (morganii) as the cause of diseases classified elsewhere: Secondary | ICD-10-CM | POA: Diagnosis present

## 2022-05-01 DIAGNOSIS — E119 Type 2 diabetes mellitus without complications: Secondary | ICD-10-CM

## 2022-05-01 DIAGNOSIS — Z7984 Long term (current) use of oral hypoglycemic drugs: Secondary | ICD-10-CM

## 2022-05-01 DIAGNOSIS — E11610 Type 2 diabetes mellitus with diabetic neuropathic arthropathy: Secondary | ICD-10-CM | POA: Diagnosis not present

## 2022-05-01 DIAGNOSIS — F32A Depression, unspecified: Secondary | ICD-10-CM | POA: Diagnosis present

## 2022-05-01 DIAGNOSIS — L97519 Non-pressure chronic ulcer of other part of right foot with unspecified severity: Secondary | ICD-10-CM | POA: Diagnosis present

## 2022-05-01 DIAGNOSIS — E872 Acidosis, unspecified: Secondary | ICD-10-CM | POA: Diagnosis not present

## 2022-05-01 DIAGNOSIS — E11621 Type 2 diabetes mellitus with foot ulcer: Secondary | ICD-10-CM | POA: Diagnosis present

## 2022-05-01 DIAGNOSIS — M86171 Other acute osteomyelitis, right ankle and foot: Secondary | ICD-10-CM | POA: Diagnosis present

## 2022-05-01 DIAGNOSIS — D696 Thrombocytopenia, unspecified: Secondary | ICD-10-CM | POA: Diagnosis not present

## 2022-05-01 DIAGNOSIS — E1142 Type 2 diabetes mellitus with diabetic polyneuropathy: Secondary | ICD-10-CM | POA: Diagnosis present

## 2022-05-01 DIAGNOSIS — E1165 Type 2 diabetes mellitus with hyperglycemia: Secondary | ICD-10-CM | POA: Diagnosis present

## 2022-05-01 DIAGNOSIS — Z89421 Acquired absence of other right toe(s): Secondary | ICD-10-CM

## 2022-05-01 DIAGNOSIS — Z720 Tobacco use: Secondary | ICD-10-CM

## 2022-05-01 DIAGNOSIS — E1100 Type 2 diabetes mellitus with hyperosmolarity without nonketotic hyperglycemic-hyperosmolar coma (NKHHC): Secondary | ICD-10-CM | POA: Diagnosis not present

## 2022-05-01 DIAGNOSIS — I10 Essential (primary) hypertension: Secondary | ICD-10-CM | POA: Diagnosis present

## 2022-05-01 DIAGNOSIS — Z818 Family history of other mental and behavioral disorders: Secondary | ICD-10-CM | POA: Diagnosis not present

## 2022-05-01 DIAGNOSIS — E1169 Type 2 diabetes mellitus with other specified complication: Secondary | ICD-10-CM | POA: Diagnosis present

## 2022-05-01 DIAGNOSIS — M8610 Other acute osteomyelitis, unspecified site: Principal | ICD-10-CM | POA: Diagnosis present

## 2022-05-01 DIAGNOSIS — Z8249 Family history of ischemic heart disease and other diseases of the circulatory system: Secondary | ICD-10-CM | POA: Diagnosis not present

## 2022-05-01 DIAGNOSIS — Z806 Family history of leukemia: Secondary | ICD-10-CM

## 2022-05-01 DIAGNOSIS — F419 Anxiety disorder, unspecified: Secondary | ICD-10-CM | POA: Diagnosis present

## 2022-05-01 DIAGNOSIS — R000 Tachycardia, unspecified: Secondary | ICD-10-CM | POA: Diagnosis present

## 2022-05-01 DIAGNOSIS — Z801 Family history of malignant neoplasm of trachea, bronchus and lung: Secondary | ICD-10-CM

## 2022-05-01 DIAGNOSIS — B9561 Methicillin susceptible Staphylococcus aureus infection as the cause of diseases classified elsewhere: Secondary | ICD-10-CM | POA: Diagnosis present

## 2022-05-01 DIAGNOSIS — L03115 Cellulitis of right lower limb: Secondary | ICD-10-CM | POA: Diagnosis present

## 2022-05-01 DIAGNOSIS — F1011 Alcohol abuse, in remission: Secondary | ICD-10-CM | POA: Diagnosis present

## 2022-05-01 DIAGNOSIS — Z79899 Other long term (current) drug therapy: Secondary | ICD-10-CM

## 2022-05-01 HISTORY — PX: IRRIGATION AND DEBRIDEMENT FOOT: SHX6602

## 2022-05-01 LAB — HEMOGLOBIN A1C
Hgb A1c MFr Bld: 5.2 % (ref 4.8–5.6)
Mean Plasma Glucose: 102.54 mg/dL

## 2022-05-01 LAB — COMPREHENSIVE METABOLIC PANEL
ALT: 14 U/L (ref 0–44)
AST: 38 U/L (ref 15–41)
Albumin: 4 g/dL (ref 3.5–5.0)
Alkaline Phosphatase: 90 U/L (ref 38–126)
Anion gap: 15 (ref 5–15)
BUN: 7 mg/dL (ref 6–20)
CO2: 18 mmol/L — ABNORMAL LOW (ref 22–32)
Calcium: 9.4 mg/dL (ref 8.9–10.3)
Chloride: 104 mmol/L (ref 98–111)
Creatinine, Ser: 0.58 mg/dL — ABNORMAL LOW (ref 0.61–1.24)
GFR, Estimated: >60 mL/min (ref >60)
Glucose, Bld: 136 mg/dL — ABNORMAL HIGH (ref 70–99)
Potassium: 3.6 mmol/L (ref 3.5–5.1)
Sodium: 137 mmol/L (ref 135–145)
Total Bilirubin: 0.5 mg/dL (ref 0.3–1.2)
Total Protein: 7.8 g/dL (ref 6.5–8.1)

## 2022-05-01 LAB — GLUCOSE, CAPILLARY
Glucose-Capillary: 95 mg/dL (ref 70–99)
Glucose-Capillary: 115 mg/dL — ABNORMAL HIGH (ref 70–99)
Glucose-Capillary: 140 mg/dL — ABNORMAL HIGH (ref 70–99)

## 2022-05-01 LAB — CBC WITH DIFFERENTIAL/PLATELET
Abs Immature Granulocytes: 0.22 10*3/uL — ABNORMAL HIGH (ref 0.00–0.07)
Basophils Absolute: 0.1 10*3/uL (ref 0.0–0.1)
Basophils Relative: 1 %
Eosinophils Absolute: 0.1 10*3/uL (ref 0.0–0.5)
Eosinophils Relative: 1 %
HCT: 40.4 % (ref 39.0–52.0)
Hemoglobin: 13.6 g/dL (ref 13.0–17.0)
Immature Granulocytes: 2 %
Lymphocytes Relative: 16 %
Lymphs Abs: 2.1 10*3/uL (ref 0.7–4.0)
MCH: 29.5 pg (ref 26.0–34.0)
MCHC: 33.7 g/dL (ref 30.0–36.0)
MCV: 87.6 fL (ref 80.0–100.0)
Monocytes Absolute: 1.1 10*3/uL — ABNORMAL HIGH (ref 0.1–1.0)
Monocytes Relative: 8 %
Neutro Abs: 9.1 10*3/uL — ABNORMAL HIGH (ref 1.7–7.7)
Neutrophils Relative %: 72 %
Platelets: 105 10*3/uL — ABNORMAL LOW (ref 150–400)
RBC: 4.61 MIL/uL (ref 4.22–5.81)
RDW: 15.3 % (ref 11.5–15.5)
WBC: 12.7 10*3/uL — ABNORMAL HIGH (ref 4.0–10.5)
nRBC: 0 % (ref 0.0–0.2)

## 2022-05-01 LAB — URINALYSIS, ROUTINE W REFLEX MICROSCOPIC
Bilirubin Urine: NEGATIVE
Glucose, UA: NEGATIVE mg/dL
Hgb urine dipstick: NEGATIVE
Ketones, ur: NEGATIVE mg/dL
Leukocytes,Ua: NEGATIVE
Nitrite: NEGATIVE
Protein, ur: NEGATIVE mg/dL
Specific Gravity, Urine: 1.002 — ABNORMAL LOW (ref 1.005–1.030)
pH: 6 (ref 5.0–8.0)

## 2022-05-01 LAB — LACTIC ACID, PLASMA
Lactic Acid, Venous: 6 mmol/L (ref 0.5–1.9)
Lactic Acid, Venous: 4.9 mmol/L (ref 0.5–1.9)
Lactic Acid, Venous: 2.6 mmol/L (ref 0.5–1.9)

## 2022-05-01 LAB — PROTIME-INR
INR: 1 (ref 0.8–1.2)
Prothrombin Time: 13.4 seconds (ref 11.4–15.2)

## 2022-05-01 SURGERY — IRRIGATION AND DEBRIDEMENT FOOT
Anesthesia: General | Site: Foot | Laterality: Right

## 2022-05-01 MED ORDER — LISINOPRIL 20 MG PO TABS
20.0000 mg | ORAL_TABLET | Freq: Every day | ORAL | Status: DC
  Administered 2022-05-03: 20 mg via ORAL
  Filled 2022-05-01 (×2): qty 1

## 2022-05-01 MED ORDER — EPHEDRINE 5 MG/ML INJ
INTRAVENOUS | Status: AC
  Filled 2022-05-01: qty 5

## 2022-05-01 MED ORDER — BUPIVACAINE HCL (PF) 0.5 % IJ SOLN
INTRAMUSCULAR | Status: AC
  Filled 2022-05-01: qty 30

## 2022-05-01 MED ORDER — EPHEDRINE SULFATE (PRESSORS) 50 MG/ML IJ SOLN
INTRAMUSCULAR | Status: DC | PRN
  Administered 2022-05-01: 5 mg via INTRAVENOUS
  Administered 2022-05-01: 10 mg via INTRAVENOUS

## 2022-05-01 MED ORDER — PANTOPRAZOLE SODIUM 40 MG PO TBEC
40.0000 mg | DELAYED_RELEASE_TABLET | Freq: Every day | ORAL | Status: DC
  Administered 2022-05-01 – 2022-05-03 (×3): 40 mg via ORAL
  Filled 2022-05-01 (×3): qty 1

## 2022-05-01 MED ORDER — ACETAMINOPHEN 650 MG RE SUPP: 650.0000 mg | Freq: Four times a day (QID) | RECTAL | Status: DC | PRN

## 2022-05-01 MED ORDER — INSULIN ASPART 100 UNIT/ML IJ SOLN
0.0000 [IU] | Freq: Three times a day (TID) | INTRAMUSCULAR | Status: DC
  Administered 2022-05-02 (×2): 3 [IU] via SUBCUTANEOUS
  Filled 2022-05-01 (×2): qty 1

## 2022-05-01 MED ORDER — PHENYLEPHRINE 80 MCG/ML (10ML) SYRINGE FOR IV PUSH (FOR BLOOD PRESSURE SUPPORT)
PREFILLED_SYRINGE | INTRAVENOUS | Status: AC
  Filled 2022-05-01: qty 10

## 2022-05-01 MED ORDER — ADULT MULTIVITAMIN W/MINERALS CH
1.0000 | ORAL_TABLET | Freq: Every day | ORAL | Status: DC
  Administered 2022-05-01 – 2022-05-03 (×3): 1 via ORAL
  Filled 2022-05-01 (×3): qty 1

## 2022-05-01 MED ORDER — OXYCODONE HCL 5 MG/5ML PO SOLN: 5.0000 mg | Freq: Once | ORAL | Status: DC | PRN

## 2022-05-01 MED ORDER — ONDANSETRON HCL 4 MG PO TABS: 4.0000 mg | ORAL_TABLET | Freq: Four times a day (QID) | ORAL | Status: DC | PRN

## 2022-05-01 MED ORDER — MIDAZOLAM HCL 2 MG/2ML IJ SOLN
INTRAMUSCULAR | Status: AC
  Filled 2022-05-01: qty 2

## 2022-05-01 MED ORDER — METOPROLOL SUCCINATE ER 50 MG PO TB24
100.0000 mg | ORAL_TABLET | Freq: Every day | ORAL | Status: DC
  Administered 2022-05-03: 100 mg via ORAL
  Filled 2022-05-01 (×2): qty 2

## 2022-05-01 MED ORDER — FENTANYL CITRATE (PF) 100 MCG/2ML IJ SOLN
INTRAMUSCULAR | Status: AC
  Filled 2022-05-01: qty 2

## 2022-05-01 MED ORDER — METRONIDAZOLE 500 MG/100ML IV SOLN
500.0000 mg | Freq: Two times a day (BID) | INTRAVENOUS | Status: DC
  Administered 2022-05-01 – 2022-05-03 (×4): 500 mg via INTRAVENOUS
  Filled 2022-05-01 (×5): qty 100

## 2022-05-01 MED ORDER — PROPOFOL 10 MG/ML IV BOLUS
INTRAVENOUS | Status: DC | PRN
  Administered 2022-05-01: 200 mg via INTRAVENOUS

## 2022-05-01 MED ORDER — FENTANYL CITRATE (PF) 100 MCG/2ML IJ SOLN
INTRAMUSCULAR | Status: DC | PRN
  Administered 2022-05-01 (×4): 50 ug via INTRAVENOUS

## 2022-05-01 MED ORDER — ONDANSETRON HCL 4 MG/2ML IJ SOLN
INTRAMUSCULAR | Status: DC | PRN
  Administered 2022-05-01: 4 mg via INTRAVENOUS

## 2022-05-01 MED ORDER — PHENYLEPHRINE HCL (PRESSORS) 10 MG/ML IV SOLN
INTRAVENOUS | Status: DC | PRN
  Administered 2022-05-01 (×5): 160 ug via INTRAVENOUS

## 2022-05-01 MED ORDER — 0.9 % SODIUM CHLORIDE (POUR BTL) OPTIME
TOPICAL | Status: DC | PRN
  Administered 2022-05-01: 500 mL
  Administered 2022-05-01: 3000 mL

## 2022-05-01 MED ORDER — LIDOCAINE HCL (CARDIAC) PF 100 MG/5ML IV SOSY
PREFILLED_SYRINGE | INTRAVENOUS | Status: DC | PRN
  Administered 2022-05-01: 100 mg via INTRAVENOUS

## 2022-05-01 MED ORDER — FENTANYL CITRATE (PF) 100 MCG/2ML IJ SOLN: 25.0000 ug | INTRAMUSCULAR | Status: DC | PRN

## 2022-05-01 MED ORDER — SODIUM CHLORIDE 0.9 % IV BOLUS
1000.0000 mL | Freq: Once | INTRAVENOUS | Status: AC
  Administered 2022-05-01: 1000 mL via INTRAVENOUS

## 2022-05-01 MED ORDER — DEXAMETHASONE SODIUM PHOSPHATE 10 MG/ML IJ SOLN
INTRAMUSCULAR | Status: AC
  Filled 2022-05-01: qty 1

## 2022-05-01 MED ORDER — DULOXETINE HCL 30 MG PO CPEP
30.0000 mg | ORAL_CAPSULE | Freq: Every day | ORAL | Status: DC
  Administered 2022-05-02 – 2022-05-03 (×2): 30 mg via ORAL
  Filled 2022-05-01 (×2): qty 1

## 2022-05-01 MED ORDER — MIDAZOLAM HCL 2 MG/2ML IJ SOLN
INTRAMUSCULAR | Status: DC | PRN
  Administered 2022-05-01: 2 mg via INTRAVENOUS

## 2022-05-01 MED ORDER — ACETAMINOPHEN 325 MG PO TABS: 650.0000 mg | ORAL_TABLET | Freq: Four times a day (QID) | ORAL | Status: DC | PRN

## 2022-05-01 MED ORDER — HYDROCHLOROTHIAZIDE 12.5 MG PO TABS
12.5000 mg | ORAL_TABLET | Freq: Every day | ORAL | Status: DC
  Administered 2022-05-03: 12.5 mg via ORAL
  Filled 2022-05-01 (×2): qty 1

## 2022-05-01 MED ORDER — PANTOPRAZOLE SODIUM 40 MG PO TBEC: 40.0000 mg | DELAYED_RELEASE_TABLET | Freq: Every day | ORAL | Status: DC

## 2022-05-01 MED ORDER — LACTATED RINGERS IV BOLUS
1000.0000 mL | Freq: Once | INTRAVENOUS | Status: AC
  Administered 2022-05-01 (×2): 250 mL via INTRAVENOUS

## 2022-05-01 MED ORDER — VANCOMYCIN HCL 2000 MG/400ML IV SOLN
2000.0000 mg | Freq: Once | INTRAVENOUS | Status: DC
  Filled 2022-05-01: qty 400

## 2022-05-01 MED ORDER — ONDANSETRON HCL 4 MG/2ML IJ SOLN: 4.0000 mg | Freq: Four times a day (QID) | INTRAMUSCULAR | Status: DC | PRN

## 2022-05-01 MED ORDER — GLYCOPYRROLATE 0.2 MG/ML IJ SOLN
INTRAMUSCULAR | Status: DC | PRN
  Administered 2022-05-01: .2 mg via INTRAVENOUS

## 2022-05-01 MED ORDER — ALPRAZOLAM 0.5 MG PO TABS
0.2500 mg | ORAL_TABLET | Freq: Two times a day (BID) | ORAL | Status: DC
  Administered 2022-05-01 – 2022-05-03 (×4): 0.25 mg via ORAL
  Filled 2022-05-01 (×4): qty 1

## 2022-05-01 MED ORDER — LEVETIRACETAM 500 MG PO TABS
500.0000 mg | ORAL_TABLET | Freq: Two times a day (BID) | ORAL | Status: DC
  Administered 2022-05-01 – 2022-05-03 (×4): 500 mg via ORAL
  Filled 2022-05-01 (×5): qty 1

## 2022-05-01 MED ORDER — OXYCODONE HCL 5 MG PO TABS: 5.0000 mg | ORAL_TABLET | Freq: Once | ORAL | Status: DC | PRN

## 2022-05-01 MED ORDER — SODIUM CHLORIDE 0.9 % IV SOLN
2.0000 g | INTRAVENOUS | Status: DC
  Administered 2022-05-01 – 2022-05-02 (×2): 2 g via INTRAVENOUS
  Filled 2022-05-01: qty 20
  Filled 2022-05-01: qty 2
  Filled 2022-05-01: qty 20

## 2022-05-01 MED ORDER — HEMOSTATIC AGENTS (NO CHARGE) OPTIME
TOPICAL | Status: DC | PRN
  Administered 2022-05-01: 1 via TOPICAL

## 2022-05-01 MED ORDER — DEXAMETHASONE SODIUM PHOSPHATE 10 MG/ML IJ SOLN
INTRAMUSCULAR | Status: DC | PRN
  Administered 2022-05-01: 10 mg via INTRAVENOUS

## 2022-05-01 MED ORDER — LISINOPRIL-HYDROCHLOROTHIAZIDE 20-12.5 MG PO TABS: 1.0000 | ORAL_TABLET | Freq: Every day | ORAL | Status: DC

## 2022-05-01 MED ORDER — MORPHINE SULFATE (PF) 2 MG/ML IV SOLN: 2.0000 mg | INTRAVENOUS | Status: DC | PRN

## 2022-05-01 MED ORDER — OXYCODONE-ACETAMINOPHEN 5-325 MG PO TABS
1.0000 | ORAL_TABLET | ORAL | Status: DC | PRN
  Administered 2022-05-02: 1 via ORAL
  Administered 2022-05-02: 2 via ORAL
  Administered 2022-05-02: 1 via ORAL
  Administered 2022-05-03: 2 via ORAL
  Filled 2022-05-01 (×2): qty 2
  Filled 2022-05-01: qty 1
  Filled 2022-05-01: qty 2

## 2022-05-01 MED ORDER — ONDANSETRON HCL 4 MG/2ML IJ SOLN
INTRAMUSCULAR | Status: AC
  Filled 2022-05-01: qty 2

## 2022-05-01 MED ORDER — BUPIVACAINE HCL (PF) 0.5 % IJ SOLN
INTRAMUSCULAR | Status: DC | PRN
  Administered 2022-05-01: 10 mL

## 2022-05-01 MED ORDER — AMLODIPINE BESYLATE 10 MG PO TABS
10.0000 mg | ORAL_TABLET | Freq: Every day | ORAL | Status: DC
  Administered 2022-05-03: 10 mg via ORAL
  Filled 2022-05-01 (×2): qty 1

## 2022-05-01 SURGICAL SUPPLY — 40 items
BLADE SURG 15 STRL LF DISP TIS (BLADE) ×1 IMPLANT
BLADE SURG 15 STRL SS (BLADE) ×1
BLADE SURG MINI STRL (BLADE) IMPLANT
BNDG ELASTIC 4X5.8 VLCR NS LF (GAUZE/BANDAGES/DRESSINGS) ×1 IMPLANT
BNDG ELASTIC 4X5.8 VLCR STR LF (GAUZE/BANDAGES/DRESSINGS) IMPLANT
BNDG ESMARK 4X12 TAN STRL LF (GAUZE/BANDAGES/DRESSINGS) ×1 IMPLANT
BNDG GAUZE DERMACEA FLUFF 4 (GAUZE/BANDAGES/DRESSINGS) ×1 IMPLANT
DRSG XEROFORM 1X8 (GAUZE/BANDAGES/DRESSINGS) IMPLANT
DURAPREP 26ML APPLICATOR (WOUND CARE) ×1 IMPLANT
ELECT REM PT RETURN 9FT ADLT (ELECTROSURGICAL) ×1
ELECTRODE REM PT RTRN 9FT ADLT (ELECTROSURGICAL) ×1 IMPLANT
GAUZE SPONGE 4X4 12PLY STRL (GAUZE/BANDAGES/DRESSINGS) ×1 IMPLANT
GAUZE STRETCH 2X75IN STRL (MISCELLANEOUS) ×1 IMPLANT
GLOVE BIO SURGEON STRL SZ7.5 (GLOVE) ×1 IMPLANT
GLOVE SURG UNDER LTX SZ8 (GLOVE) ×1 IMPLANT
GOWN STRL REUS W/ TWL LRG LVL3 (GOWN DISPOSABLE) ×2 IMPLANT
GOWN STRL REUS W/TWL LRG LVL3 (GOWN DISPOSABLE) ×2
HEMOSTAT SURGICEL 2X14 (HEMOSTASIS) IMPLANT
KIT TURNOVER KIT A (KITS) ×1 IMPLANT
LABEL OR SOLS (LABEL) ×1 IMPLANT
MANIFOLD NEPTUNE II (INSTRUMENTS) ×1 IMPLANT
NDL FILTER BLUNT 18X1 1/2 (NEEDLE) ×1 IMPLANT
NDL HYPO 25X1 1.5 SAFETY (NEEDLE) ×3 IMPLANT
NEEDLE FILTER BLUNT 18X1 1/2 (NEEDLE) ×1 IMPLANT
NEEDLE HYPO 25X1 1.5 SAFETY (NEEDLE) ×3 IMPLANT
NS IRRIG 500ML POUR BTL (IV SOLUTION) ×1 IMPLANT
PACK EXTREMITY ARMC (MISCELLANEOUS) ×1 IMPLANT
PAD ABD DERMACEA PRESS 5X9 (GAUZE/BANDAGES/DRESSINGS) IMPLANT
PULSAVAC PLUS IRRIG FAN TIP (DISPOSABLE) ×1
SOL .9 NS 3000ML IRR  AL (IV SOLUTION) ×1
SOL .9 NS 3000ML IRR UROMATIC (IV SOLUTION) IMPLANT
SOL PREP PVP 2OZ (MISCELLANEOUS) ×1
SOLUTION PREP PVP 2OZ (MISCELLANEOUS) ×1 IMPLANT
STOCKINETTE STRL 6IN 960660 (GAUZE/BANDAGES/DRESSINGS) ×1 IMPLANT
SUT ETHILON 3-0 FS-10 30 BLK (SUTURE) ×1
SUTURE EHLN 3-0 FS-10 30 BLK (SUTURE) ×1 IMPLANT
SYR 10ML LL (SYRINGE) ×2 IMPLANT
TIP FAN IRRIG PULSAVAC PLUS (DISPOSABLE) IMPLANT
TRAP FLUID SMOKE EVACUATOR (MISCELLANEOUS) ×1 IMPLANT
WATER STERILE IRR 500ML POUR (IV SOLUTION) ×1 IMPLANT

## 2022-05-01 NOTE — ED Notes (Signed)
Date and time results received: 05/01/22 1342   Test: lactic Critical Value: 6.0  Name of Provider Notified: Charleen Kirks

## 2022-05-01 NOTE — Transfer of Care (Signed)
Immediate Anesthesia Transfer of Care Note  Patient: Daniel Ritter  Procedure(s) Performed: IRRIGATION AND DEBRIDEMENT FOOT;SOFT TISSUE AND BONE (Right: Foot)  Patient Location: PACU  Anesthesia Type:General  Level of Consciousness: awake  Airway & Oxygen Therapy: Patient Spontanous Breathing and Patient connected to face mask oxygen  Post-op Assessment: Report given to RN and Post -op Vital signs reviewed and stable  Post vital signs: Reviewed  Last Vitals:  Vitals Value Taken Time  BP 122/88   Temp    Pulse 115 05/01/22 1804  Resp    SpO2 95 % 05/01/22 1804  Vitals shown include unvalidated device data.  Last Pain:         Complications: No notable events documented.

## 2022-05-01 NOTE — H&P (View-Only) (Signed)
Reason for Consult: Osteomyelitis with chronic ulceration right foot. Referring Physician: Aseem Sessums is an 46 y.o. male.  HPI: This is an 46 year old male with history of diabetes and neuropathy who underwent right fifth ray resection this past summer.  Has developed an ulceration along the side of his foot that has not shown significant signs of healing over the last month or 2 and has worsened.  Recommended admission to the hospital for MRI and debridement of the ulcer and/or underlying bone.  Past Medical History:  Diagnosis Date   Allergy    seasonal   Anxiety    Diabetes mellitus without complication ([name])    GERD (gastroesophageal reflux disease)    H/O ETOH abuse    History of rectal abscess    Hypertension 2007   Neuropathy    Pancreatitis    history of, pt states due to alcohol   Seizures ([name]) 08/2014   " two seizures in the hospital"   Stroke [facility]) 08/31/2013   Thrombocytopenia [facility] [place])     Past Surgical History:  Procedure Laterality Date   AMPUTATION TOE Right 01/30/2022   Procedure: PARTIAL 5TH RAY AMUPTATION RIGHT FOOT;  Surgeon: Samara Deist, DPM;  Location: [facility] ORS;  Service: Podiatry;  Laterality: Right;   ANTERIOR CRUCIATE LIGAMENT REPAIR Left 1992, 1993   COLONOSCOPY N/A 09/04/2021   Procedure: COLONOSCOPY;  Surgeon: Toledo, Benay Pike, MD;  Location: [facility] ENDOSCOPY;  Service: Gastroenterology;  Laterality: N/A;  DM   INCISION AND DRAINAGE PERIRECTAL ABSCESS N/A 12/14/2015   Procedure: IRRIGATION AND DEBRIDEMENT PERIRECTAL ABSCESS;  Surgeon: Christene Lye, MD;  Location: [facility] ORS;  Service: General;  Laterality: N/A;   IRRIGATION AND DEBRIDEMENT FOOT Bilateral 01/30/2022   Procedure: IRRIGATION AND DEBRIDEMENT TENDON ULCER LEFT FOOT,  EXCISION OF 2ND TOE JOINT LEFT FOOT, IRRIGATION AND DEBRIDEMENT SUBCUTANEOUS ULCER RIGHT FOOT;  Surgeon: Samara Deist, DPM;  Location: [facility] ORS;  Service: Podiatry;  Laterality: Bilateral;   LOWER EXTREMITY  ANGIOGRAPHY Right 02/04/2022   Procedure: Lower Extremity Angiography;  Surgeon: Katha Cabal, MD;  Location: [facility] CV LAB;  Service: Cardiovascular;  Laterality: Right;    Family History  Problem Relation Age of Onset   Hypertension Mother    Anxiety disorder Mother    Hypertension Father    Hypertension Brother    Leukemia Maternal Grandfather    Lung cancer Paternal Grandfather     Social History:  reports that he quit smoking about 2 years ago. His smoking use included cigarettes. He has a 4.50 pack-year smoking history. He quit smokeless tobacco use about 22 years ago.  His smokeless tobacco use included snuff. He reports current alcohol use. He reports that he does not currently use drugs after having used the following drugs: Marijuana.  Allergies: No Known Allergies  Medications: Scheduled:  ALPRAZolam  0.25 mg Oral BID   [START ON 05/02/2022] amLODipine  10 mg Oral Daily   [START ON 05/02/2022] DULoxetine  30 mg Oral Daily   [START ON 05/02/2022] lisinopril  20 mg Oral Daily   And   [START ON 05/02/2022] hydrochlorothiazide  12.5 mg Oral Daily   insulin aspart  0-15 Units Subcutaneous TID WC   levETIRAcetam  500 mg Oral BID   [START ON 05/02/2022] metoprolol succinate  100 mg Oral Daily   multivitamin with minerals  1 tablet Oral Daily   [START ON 05/02/2022] pantoprazole  40 mg Oral Daily    Results for orders placed or performed during the hospital  encounter of 05/01/22 (from the past 48 hour(s))  CBC with Differential/Platelet     Status: Abnormal   Collection Time: 05/01/22  1:00 PM  Result Value Ref Range   WBC 12.7 (H) 4.0 - 10.5 K/uL   RBC 4.61 4.22 - 5.81 MIL/uL   Hemoglobin 13.6 13.0 - 17.0 g/dL   HCT 40.4 39.0 - 52.0 %   MCV 87.6 80.0 - 100.0 fL   MCH 29.5 26.0 - 34.0 pg   MCHC 33.7 30.0 - 36.0 g/dL   RDW 15.3 11.5 - 15.5 %   Platelets 105 (L) 150 - 400 K/uL   nRBC 0.0 0.0 - 0.2 %   Neutrophils Relative % 72 %   Neutro Abs 9.1 (H) 1.7 -  7.7 K/uL   Lymphocytes Relative 16 %   Lymphs Abs 2.1 0.7 - 4.0 K/uL   Monocytes Relative 8 %   Monocytes Absolute 1.1 (H) 0.1 - 1.0 K/uL   Eosinophils Relative 1 %   Eosinophils Absolute 0.1 0.0 - 0.5 K/uL   Basophils Relative 1 %   Basophils Absolute 0.1 0.0 - 0.1 K/uL   Immature Granulocytes 2 %   Abs Immature Granulocytes 0.22 (H) 0.00 - 0.07 K/uL    Comment: Performed at [facility], [address] [place], [place] [address]  Comprehensive metabolic panel     Status: Abnormal   Collection Time: 05/01/22  1:00 PM  Result Value Ref Range   Sodium 137 135 - 145 mmol/L   Potassium 3.6 3.5 - 5.1 mmol/L   Chloride 104 98 - 111 mmol/L   CO2 18 (L) 22 - 32 mmol/L   Glucose, Bld 136 (H) 70 - 99 mg/dL    Comment: Glucose reference range applies only to samples taken after fasting for at least 8 hours.   BUN 7 6 - 20 mg/dL   Creatinine, Ser 0.58 (L) 0.61 - 1.24 mg/dL   Calcium 9.4 8.9 - 10.3 mg/dL   Total Protein 7.8 6.5 - 8.1 g/dL   Albumin 4.0 3.5 - 5.0 g/dL   AST 38 15 - 41 U/L   ALT 14 0 - 44 U/L   Alkaline Phosphatase 90 38 - 126 U/L   Total Bilirubin 0.5 0.3 - 1.2 mg/dL   GFR, Estimated >60 >60 mL/min    Comment: (NOTE) Calculated using the CKD-EPI Creatinine Equation (2021)    Anion gap 15 5 - 15    Comment: Performed at [facility], [address] [place], [place] [address]  Lactic acid, plasma     Status: Abnormal   Collection Time: 05/01/22  1:01 PM  Result Value Ref Range   Lactic Acid, Venous 6.0 (HH) 0.5 - 1.9 mmol/L    Comment: CRITICAL RESULT CALLED TO, READ BACK BY AND VERIFIED WITH 05/01/2022 AT 1341 TO Vennie Homans, ADC/SS Performed at [facility], [address] [place], [place] [address]   Protime-INR     Status: None   Collection Time: 05/01/22  1:01 PM  Result Value Ref Range   Prothrombin Time 13.4 11.4 - 15.2 seconds   INR 1.0 0.8 - 1.2    Comment: (NOTE) INR goal varies based on device and disease  states. Performed at [facility], [address] [place], [name] [address]   Urinalysis, Routine w reflex microscopic Urine, Clean Catch     Status: Abnormal   Collection Time: 05/01/22  2:05 PM  Result Value Ref Range   Color, Urine COLORLESS (A) YELLOW  APPearance CLEAR (A) CLEAR   Specific Gravity, Urine 1.002 (L) 1.005 - 1.030   pH 6.0 5.0 - 8.0   Glucose, UA NEGATIVE NEGATIVE mg/dL   Hgb urine dipstick NEGATIVE NEGATIVE   Bilirubin Urine NEGATIVE NEGATIVE   Ketones, ur NEGATIVE NEGATIVE mg/dL   Protein, ur NEGATIVE NEGATIVE mg/dL   Nitrite NEGATIVE NEGATIVE   Leukocytes,Ua NEGATIVE NEGATIVE    Comment: Performed at [facility], [address] [place], [place] [address]    DG Chest 2 View  Result Date: 05/01/2022 CLINICAL DATA:  Sepsis. EXAM: CHEST - 2 VIEW COMPARISON:  01/28/2022 FINDINGS: The lungs are clear without focal pneumonia, edema, pneumothorax or pleural effusion. The cardiopericardial silhouette is within normal limits for size. The visualized bony structures of the thorax are unremarkable. IMPRESSION: No active cardiopulmonary disease. Electronically Signed   By: Misty Stanley M.D.   On: 05/01/2022 14:19    Review of Systems  Constitutional:  Negative for chills and fever.  HENT:  Negative for sinus pain and sore throat.   Respiratory:  Negative for cough and shortness of breath.   Cardiovascular:  Negative for chest pain and palpitations.  Gastrointestinal:  Negative for nausea and vomiting.  Genitourinary:  Negative for frequency and urgency.  Musculoskeletal:  Negative for arthralgias.       Patient does relate amputation of the fifth toe and partial ray the summer.  Skin:        Has had a chronic draining ulceration along the outside of his right foot.  Recently has had some increased redness and swelling which has improved some with oral antibiotics.  Neurological:        Significant neuropathy associated with his diabetes   Psychiatric/Behavioral:  Negative for confusion. The patient is not nervous/anxious.    Blood pressure 137/84, pulse 92, temperature 98.8 F (37.1 C), resp. rate 18, SpO2 98 %. Physical Exam Cardiovascular:     Comments: DP and PT pulses are palpable bilateral. Musculoskeletal:     Comments: Adequate range of motion of the pedal joints.  Previous right fifth partial ray resection.  Muscle testing deferred.  Skin:    Comments: The skin is warm dry and supple.  Some erythema and edema in the right foot with a full-thickness ulcerative area at the lateral base of the fifth metatarsal.  Neurological:     Comments: Loss of protective threshold with monofilament wire distally in the forefoot and toes.      Assessment/Plan: Assessment: 1.  Full-thickness ulceration with osteomyelitis right fifth metatarsal. 2.  Diabetes with associated neuropathy. 3.  Cellulitis right foot.  Plan: Discussed with the patient that the MRI did show bone infection along the remaining portion of his fifth metatarsal beneath the ulceration.  This will require resection of the remaining portion of the bone.  Discussed with the patient possible risks and complications of the procedure including but not limited to inability of the wound to heal due to continued infection or due to his diabetes.  Discussed the risk of increased foot deformity after releasing the peroneal tendon from the fifth metatarsal base.  No guarantees could be given as to the outcome of surgery.  Questions invited and answered.  Patient has been n.p.o. since last night.  Consent for debridement of soft tissue and bone right foot.  Plan for surgery this evening  Daniel Ritter 05/01/2022, 4:07 PM

## 2022-05-01 NOTE — Assessment & Plan Note (Signed)
Patient has a history of intermittent thrombocytopenia with platelets today measuring 105.  Although patient does have a history of alcohol use disorder in remission, he has no known history of cirrhosis.  It appears that previous thrombocytopenia was always during acute illness.  Will monitor while admitted.  Recommend outpatient follow-up with PCP.   -CBC in the a.m.

## 2022-05-01 NOTE — ED Provider Notes (Signed)
[facility] Provider Note    Event Date/Time   First MD Initiated Contact with Patient 05/01/22 1303     (approximate)   History   Wound   HPI  Daniel Ritter is a 46 y.o. male with a history of diabetes who represents for continued ulceration to the right foot.  I saw this patient 2 days ago obtained MRI which was demonstrative of osteomyelitis in the right lateral foot.  He was unable to stay because of his father entering hospice care, he has returned as soon as he could.  Denies fevers or chills.  No significant changes      Physical Exam   Triage Vital Signs: ED Triage Vitals  Enc Vitals Group     BP 05/01/22 1244 130/87     Pulse Rate 05/01/22 1244 (!) 102     Resp 05/01/22 1244 18     Temp 05/01/22 1244 98.8 F (37.1 C)     Temp Source 05/01/22 1244 Oral     SpO2 05/01/22 1244 99 %     Weight --      Height --      Head Circumference --      Peak Flow --      Pain Score 05/01/22 1249 4     Pain Loc --      Pain Edu? --      Excl. in [place]? --     Most recent vital signs: Vitals:   05/01/22 1244  BP: 130/87  Pulse: (!) 102  Resp: 18  Temp: 98.8 F (37.1 C)  SpO2: 99%     General: Awake, no distress.  CV:  Good peripheral perfusion.  Resp:  Normal effort.  Abd:  No distention.  Other:  Right foot: Deep ulceration noted to the lateral midfoot with surrounding erythema, mild swelling   ED Results / Procedures / Treatments   Labs (all labs ordered are listed, but only abnormal results are displayed) Labs Reviewed  CULTURE, BLOOD (ROUTINE X 2)  CULTURE, BLOOD (ROUTINE X 2)  COMPREHENSIVE METABOLIC PANEL  LACTIC ACID, PLASMA  LACTIC ACID, PLASMA  CBC WITH DIFFERENTIAL/PLATELET  PROTIME-INR  URINALYSIS, ROUTINE W REFLEX MICROSCOPIC     EKG    RADIOLOGY MRI obtained 2 days ago consistent with osteomyelitis    PROCEDURES:  Critical Care performed:   Procedures   MEDICATIONS ORDERED IN ED: Medications   cefTRIAXone (ROCEPHIN) 2 g in sodium chloride 0.9 % 100 mL IVPB (has no administration in time range)  metroNIDAZOLE (FLAGYL) IVPB 500 mg (has no administration in time range)  vancomycin (VANCOREADY) IVPB 2000 mg/400 mL (has no administration in time range)     IMPRESSION / MDM / ASSESSMENT AND PLAN / ED COURSE  I reviewed the triage vital signs and the nursing notes. Patient's presentation is most consistent with severe exacerbation of chronic illness.   Patient has return to the emergency department as noted above.  He has MRI demonstrated osteomyelitis.  We will reobtain labs, will start IV antibiotics and admit to the hospitalist service  Discussed with Dr. Cleda Mccreedy of podiatry, he will consult  Discussed with hospitalist service, they will admit the patient       FINAL CLINICAL IMPRESSION(S) / ED DIAGNOSES   Final diagnoses:  None     Rx / DC Orders   ED Discharge Orders     None        Note:  This document was prepared using Dragon voice  recognition software and may include unintentional dictation errors.   [name], MD 05/01/22 1319

## 2022-05-01 NOTE — ED Triage Notes (Signed)
Pt had toe amputation on right foot in July.  Pt had MRI on Tues and was told he needed IV anbx and possibly a picc line and/ or surgery.  Pt has been on oral anbx.

## 2022-05-01 NOTE — ED Notes (Signed)
Informed RN bed assigned 

## 2022-05-01 NOTE — Assessment & Plan Note (Signed)
Initial lactic acid markedly elevated on admission at 6.0.  There is no evidence of hypotension or tachycardia, patient is not septic.  Due to this, differential includes type B lactic acidosis.  Will start with rehydration and recheck.  - LR 1 L bolus - Repeat lactic acid in 3 hours

## 2022-05-01 NOTE — Assessment & Plan Note (Signed)
-   Continue home Xanax and duloxetine

## 2022-05-01 NOTE — Assessment & Plan Note (Addendum)
Patient presenting with a right foot ulcer with MRI confirming right metatarsal osteomyelitis overlying previously well healing fifth ray amputation.  At this time, there is no evidence of sepsis.  Previously treated with Zosyn for 4-week course as blood cultures grew Peptostreptococcus and group B strep.  Wound cultures grew Proteus and MSSA. He has already been covered with Rocephin, vancomycin and metronidazole in the ED.  - Podiatry consulted; appreciate their recommendations - Per discussions with Dr. Cleda Mccreedy, potentially going to the OR tonight - Request a bone biopsy for culture data - Continue ceftriaxone, metronidazole and vancomycin - Keep n.p.o. - Tylenol as needed for fever - Blood cultures pending

## 2022-05-01 NOTE — Assessment & Plan Note (Signed)
Previously uncontrolled type 2 diabetes complicated by significant neuropathy, however most recent A1c was 5.7%.  - Hold home oral antiglycemic's - SSI, moderate - A1c pending

## 2022-05-01 NOTE — Anesthesia Postprocedure Evaluation (Signed)
Anesthesia Post Note  Patient: SINAN TUCH  Procedure(s) Performed: IRRIGATION AND DEBRIDEMENT FOOT;SOFT TISSUE AND BONE (Right: Foot)  Patient location during evaluation: PACU Anesthesia Type: General Level of consciousness: awake and alert Pain management: pain level controlled Vital Signs Assessment: post-procedure vital signs reviewed and stable Respiratory status: spontaneous breathing, nonlabored ventilation, respiratory function stable and patient connected to nasal cannula oxygen Cardiovascular status: blood pressure returned to baseline and stable Postop Assessment: no apparent nausea or vomiting Anesthetic complications: no   No notable events documented.   Last Vitals:  Vitals:   05/01/22 1901 05/01/22 2041  BP: 132/84 118/87  Pulse: (!) 104 90  Resp: 16 17  Temp: 36.8 C 36.8 C  SpO2: 95% 100%    Last Pain:  Vitals:   05/01/22 1828  TempSrc:   PainSc: 0-No pain                 Dimas Millin

## 2022-05-01 NOTE — Anesthesia Preprocedure Evaluation (Signed)
Anesthesia Evaluation  Patient identified by MRN, date of birth, ID band Patient awake    Reviewed: Allergy & Precautions, NPO status , Patient's Chart, lab work & pertinent test results  History of Anesthesia Complications Negative for: history of anesthetic complications  Airway Mallampati: II  TM Distance: >3 FB Neck ROM: Full    Dental  (+) Poor Dentition, Implants, Dental Advidsory Given, Chipped   Pulmonary neg pulmonary ROS, neg sleep apnea, neg COPD, Patient abstained from smoking.Not current smoker, former smoker,    Pulmonary exam normal breath sounds clear to auscultation       Cardiovascular Exercise Tolerance: Good METShypertension, Pt. on medications + Peripheral Vascular Disease  (-) CAD and (-) Past MI (-) dysrhythmias  Rhythm:Regular Rate:Normal - Systolic murmurs    Neuro/Psych Seizures -, Well Controlled,  PSYCHIATRIC DISORDERS Anxiety Depression CVA, No Residual Symptoms    GI/Hepatic GERD  Medicated and Controlled,(+)     substance abuse  marijuana use,   Endo/Other  diabetes, Well Controlled, Type 2  Renal/GU negative Renal ROS     Musculoskeletal   Abdominal   Peds  Hematology   Anesthesia Other Findings Past Medical History: No date: Allergy     Comment:  seasonal No date: Anxiety No date: Diabetes mellitus without complication ([facility]) No date: GERD (gastroesophageal reflux disease) No date: H/O ETOH abuse No date: History of rectal abscess 2007: Hypertension No date: Neuropathy No date: Pancreatitis     Comment:  history of, pt states due to alcohol 08/2014: Seizures ([place])     Comment:  " two seizures in the hospital" 08/31/2013: Stroke [facility]) No date: Thrombocytopenia ([place])  Reproductive/Obstetrics                             Anesthesia Physical  Anesthesia Plan  ASA: 3  Anesthesia Plan: General   Post-op Pain Management: Minimal or no pain  anticipated   Induction: Intravenous  PONV Risk Score and Plan: 2 and Ondansetron and Midazolam  Airway Management Planned: LMA  Additional Equipment: None  Intra-op Plan:   Post-operative Plan:   Informed Consent: I have reviewed the patients History and Physical, chart, labs and discussed the procedure including the risks, benefits and alternatives for the proposed anesthesia with the patient or authorized representative who has indicated his/her understanding and acceptance.     Dental advisory given  Plan Discussed with: CRNA and Surgeon  Anesthesia Plan Comments: (Discussed risks of anesthesia with patient, including possibility of difficulty with spontaneous ventilation under anesthesia necessitating airway intervention, PONV, and rare risks such as cardiac or respiratory or neurological events, and allergic reactions. Discussed the role of CRNA in patient's perioperative care. Patient understands.)        Anesthesia Quick Evaluation

## 2022-05-01 NOTE — Assessment & Plan Note (Signed)
-   Continue home [place]

## 2022-05-01 NOTE — Consult Note (Signed)
PHARMACY -  BRIEF ANTIBIOTIC NOTE   Pharmacy has received consult(s) for Vancomycin from an ED provider.  The patient's profile has been reviewed for ht/wt/allergies/indication/available labs.    One time order(s) placed for : Vancomycin 2000 mg IVPB x 1 dose  Further antibiotics/pharmacy consults should be ordered by admitting physician if indicated.                       Thank you, Charda Janis Rodriguez-Guzman PharmD, BCPS 05/01/2022 1:06 PM

## 2022-05-01 NOTE — H&P (Signed)
History and Physical    Patient: Daniel Ritter DOB: 31-Jan-1976 DOA: 05/01/2022 DOS: the patient was seen and examined on 05/01/2022 PCP: Theotis Burrow, MD  Patient coming from: Home  Chief Complaint:  Chief Complaint  Patient presents with   Wound   HPI: Daniel Ritter is a 46 y.o. male with medical history significant of recent osteomyelitis s/p fifth ray amputation, type 2 diabetes with peripheral neuropathy, hypertension, anxiety with depression, seizures who presents to the ED with a right toe wound.  Daniel Ritter states he first developed bilateral cellulitis with right fifth metatarsal osteomyelitis in July 2023 and underwent 1/5 ray amputation with Dr. Vickki Muff.  He was discharged home on 4 weeks of Zosyn via PICC line.  Since that time, he had been healing well but unfortunately subsequently developed a new ulcer on his right foot at the end of September.  He had been following with Dr. Vickki Muff regularly and had undergone superficial debridement.  Despite this, the ulcer began to enlarge and he developed erythema and purulent drainage with a foul odor.  At this time, he was started on Keflex on 10/19.  He presented to the ED on 10/24 with MRI demonstrating right metatarsal osteomyelitis, however Daniel Ritter was unable to stay as his father was being placed on hospice.  He returns today at the urging of Dr. Vickki Muff.  He states that since beginning Keflex, he has noticed significant improvement in the erythema around the wound and he is not experiencing any pain at this time.  He denies any other symptoms at this time including fever, chills, chest pain, shortness of breath, palpitations, nausea, vomiting, diarrhea, urinary or bowel changes.  ED course: On arrival to the ED, patient was afebrile at 98.8 with blood pressure 130/87 and heart rate of 102 that improved to 92.  Given prior work-up demonstrating leukocytosis with MRI confirming osteomyelitis, patient was  started on ceftriaxone, vancomycin and metronidazole.  Podiatry was consulted.  [facility] contacted for admission.  Review of Systems: As mentioned in the history of present illness. All other systems reviewed and are negative. Past Medical History:  Diagnosis Date   Allergy    seasonal   Anxiety    Diabetes mellitus without complication ([place])    GERD (gastroesophageal reflux disease)    H/O ETOH abuse    History of rectal abscess    Hypertension 2007   Neuropathy    Pancreatitis    history of, pt states due to alcohol   Seizures ([place]) 08/2014   " two seizures in the hospital"   Stroke [facility]) 08/31/2013   Thrombocytopenia [facility])    Past Surgical History:  Procedure Laterality Date   AMPUTATION TOE Right 01/30/2022   Procedure: PARTIAL 5TH RAY AMUPTATION RIGHT FOOT;  Surgeon: Samara Deist, DPM;  Location: [facility] ORS;  Service: Podiatry;  Laterality: Right;   ANTERIOR CRUCIATE LIGAMENT REPAIR Left 1992, 1993   COLONOSCOPY N/A 09/04/2021   Procedure: COLONOSCOPY;  Surgeon: Toledo, Benay Pike, MD;  Location: [facility] ENDOSCOPY;  Service: Gastroenterology;  Laterality: N/A;  DM   INCISION AND DRAINAGE PERIRECTAL ABSCESS N/A 12/14/2015   Procedure: IRRIGATION AND DEBRIDEMENT PERIRECTAL ABSCESS;  Surgeon: Christene Lye, MD;  Location: [facility] ORS;  Service: General;  Laterality: N/A;   IRRIGATION AND DEBRIDEMENT FOOT Bilateral 01/30/2022   Procedure: IRRIGATION AND DEBRIDEMENT TENDON ULCER LEFT FOOT,  EXCISION OF 2ND TOE JOINT LEFT FOOT, IRRIGATION AND DEBRIDEMENT SUBCUTANEOUS ULCER RIGHT FOOT;  Surgeon: Samara Deist, DPM;  Location: [facility]  ORS;  Service: Podiatry;  Laterality: Bilateral;   LOWER EXTREMITY ANGIOGRAPHY Right 02/04/2022   Procedure: Lower Extremity Angiography;  Surgeon: Katha Cabal, MD;  Location: [place] CV LAB;  Service: Cardiovascular;  Laterality: Right;   Social History:  reports that he quit smoking about 2 years ago. His smoking use included cigarettes. He has a 4.50  pack-year smoking history. He quit smokeless tobacco use about 22 years ago.  His smokeless tobacco use included snuff. He reports current alcohol use. He reports that he does not currently use drugs after having used the following drugs: Marijuana.  No Known Allergies  Family History  Problem Relation Age of Onset   Hypertension Mother    Anxiety disorder Mother    Hypertension Father    Hypertension Brother    Leukemia Maternal Grandfather    Lung cancer Paternal Grandfather     Prior to Admission medications   Medication Sig Start Date End Date Taking? Authorizing Provider  acetaminophen (TYLENOL) 325 MG tablet Take 2 tablets (650 mg total) by mouth every 6 (six) hours as needed for mild pain (or Fever >/= 101). 02/05/22  Yes Jennye Boroughs, MD  ALPRAZolam Duanne Moron) 0.25 MG tablet Take 0.25 mg by mouth 2 (two) times daily.   Yes [provider]  amLODipine (NORVASC) 10 MG tablet Take 10 mg by mouth daily as needed. 09/02/21  Yes [provider]  cephALEXin (KEFLEX) 500 MG capsule Take 500 mg by mouth 3 (three) times daily. 04/24/22  Yes [provider]  DULoxetine (CYMBALTA) 30 MG capsule Take 30 mg by mouth daily. 11/08/21  Yes [provider]  JANUVIA 100 MG tablet Take 100 mg by mouth daily. 04/30/20  Yes [provider]  levETIRAcetam (KEPPRA) 500 MG tablet Take 1 tablet (500 mg total) by mouth 2 (two) times daily. 09/04/14  Yes Donzetta Starch, NP  lisinopril-hydrochlorothiazide (PRINZIDE,ZESTORETIC) 20-12.5 MG per tablet Take 1 tablet by mouth daily.   Yes [provider]  metFORMIN (GLUCOPHAGE) 1000 MG tablet Take 1,000 mg by mouth 2 (two) times daily with a meal.   Yes [provider]  metoprolol succinate (TOPROL-XL) 100 MG 24 hr tablet Take 100 mg by mouth at bedtime. Take with or immediately following a meal.   Yes [provider]  omeprazole (PRILOSEC) 20 MG capsule Take 20 mg by mouth 2 (two) times daily before a  meal.   Yes [provider]    Physical Exam: Vitals:   05/01/22 1244 05/01/22 1452  BP: 130/87 114/70  Pulse: (!) 102 92  Resp: 18 17  Temp: 98.8 F (37.1 C) 98.6 F (37 C)  TempSrc: Oral Oral  SpO2: 99% 96%   Physical Exam Vitals and nursing note reviewed.  Constitutional:      General: He is not in acute distress.    Appearance: He is normal weight. He is not toxic-appearing.  HENT:     Head: Normocephalic and atraumatic.     Mouth/Throat:     Mouth: Mucous membranes are moist.     Pharynx: Oropharynx is clear.  Eyes:     Conjunctiva/sclera: Conjunctivae normal.     Pupils: Pupils are equal, round, and reactive to light.  Cardiovascular:     Rate and Rhythm: Normal rate and regular rhythm.     Heart sounds: No murmur heard.    No gallop.  Pulmonary:     Effort: Pulmonary effort is normal. No respiratory distress.     Breath sounds: Normal  breath sounds. No wheezing, rhonchi or rales.  Abdominal:     General: Bowel sounds are normal. There is no distension.     Palpations: Abdomen is soft.     Tenderness: There is no abdominal tenderness. There is no guarding.  Musculoskeletal:        General: No tenderness or deformity.     Cervical back: Neck supple.     Right lower leg: No edema.     Left lower leg: No edema.       Feet:  Feet:     Right foot:     Skin integrity: Ulcer and erythema present.     Left foot:     Skin integrity: Callus (Callus on the plantar aspect with no evidence of skin breakdown.) present. No ulcer, blister, skin breakdown, erythema or warmth.  Skin:    General: Skin is warm and dry.  Neurological:     General: No focal deficit present.     Mental Status: He is alert and oriented to person, place, and time. Mental status is at baseline.  Psychiatric:        Mood and Affect: Mood normal.        Behavior: Behavior normal.    Data Reviewed: Dr. Vickki Muff started patient on CBC remarkable for WBC of 12.7, platelets of 105.  CMP  remarkable for bicarb of 18, glucose of 136, creatinine of 0.58.  Lactic acid markedly elevated at 6.0.  Urinalysis unremarkable.  MRI foot obtained on 10/24 demonstrating new soft tissue ulcer at the lateral aspect of the fifth metatarsal with underlying osteomyelitis.  No evidence of abscess.  Chest x-ray personally reviewed.  No evidence of opacities or pleural effusions.  Results are pending, will review when available.  Assessment and Plan: * Acute osteomyelitis [facility]) Patient presenting with a right foot ulcer with MRI confirming right metatarsal osteomyelitis overlying previously well healing fifth ray amputation.  At this time, there is no evidence of sepsis.  Previously treated with Zosyn for 4-week course as blood cultures grew Peptostreptococcus and group B strep.  Wound cultures grew Proteus and MSSA. He has already been covered with Rocephin, vancomycin and metronidazole in the ED.  - Podiatry consulted; appreciate their recommendations - Per discussions with Dr. Cleda Mccreedy, potentially going to the OR tonight - Request a bone biopsy for culture data - Continue ceftriaxone, metronidazole and vancomycin - Keep n.p.o. - Tylenol as needed for fever - Blood cultures pending  Lactic acidosis Initial lactic acid markedly elevated on admission at 6.0.  There is no evidence of hypotension or tachycardia, patient is not septic.  Due to this, differential includes type B lactic acidosis.  Will start with rehydration and recheck.  - LR 1 L bolus - Repeat lactic acid in 3 hours  Type 2 diabetes mellitus ([place]) Previously uncontrolled type 2 diabetes complicated by significant neuropathy, however most recent A1c was 5.7%.  - Hold home oral antiglycemic's - SSI, moderate - A1c pending  Essential hypertension - Continue home antihypertensives  Thrombocytopenia ([place]) Patient has a history of intermittent thrombocytopenia with platelets today measuring 105.  Although patient does have a  history of alcohol use disorder in remission, he has no known history of cirrhosis.  It appears that previous thrombocytopenia was always during acute illness.  Will monitor while admitted.  Recommend outpatient follow-up with PCP.   -CBC in the a.m.  Seizures ([place]) - Continue home Keppra  Anxiety and depression - Continue home Xanax and duloxetine   Advance  Care Planning:   Code Status: Full Code   Consults: Podiatry  Family Communication: No family at bedside  Severity of Illness: The appropriate patient status for this patient is INPATIENT. Inpatient status is judged to be reasonable and necessary in order to provide the required intensity of service to ensure the patient's safety. The patient's presenting symptoms, physical exam findings, and initial radiographic and laboratory data in the context of their chronic comorbidities is felt to place them at high risk for further clinical deterioration. Furthermore, it is not anticipated that the patient will be medically stable for discharge from the hospital within 2 midnights of admission.   * I certify that at the point of admission it is my clinical judgment that the patient will require inpatient hospital care spanning beyond 2 midnights from the point of admission due to high intensity of service, high risk for further deterioration and high frequency of surveillance required.*  Author: Jose Persia, MD 05/01/2022 3:18 PM  For on call review [email or web address].

## 2022-05-01 NOTE — Op Note (Signed)
Date of operation: 05/01/2022.  Surgeon: Durward Fortes D.P.M.  Preoperative diagnosis: Full-thickness ulceration with osteomyelitis right fifth metatarsal.  Postoperative diagnosis: Same.  Procedure: Excisional debridement soft tissue and bone right foot.  Anesthesia: LMA with local.  Hemostasis: Esmarch bandage right ankle  Estimated blood loss: 25 cc.  Cultures: Bone culture right fifth metatarsal base.  Pathology: Right fifth metatarsal.  Implants: Surgicel.  Complications: None apparent.  Operative indications: This is a 46 year old male with ulceration on the right foot gradually worsening over time and not responding to outpatient treatment.  Recommendation was for hospitalization.  MRI showed osteomyelitis of the fifth metatarsal.  Decision made for excisional debridement of the ulceration and remaining fifth metatarsal.  Operative procedure: Patient was taken to the operating room and placed on the table in the supine position.  Following satisfactory LMA anesthesia the right foot was anesthetized with 10 cc of 0.5% Marcaine plain around the fifth metatarsal base area.  The foot was prepped and draped in usual sterile fashion.  The foot was exsanguinated using an Esmarch bandage and this was left intact around the ankle to act as a tourniquet.  Attention was directed to the lateral aspect of the right foot where a linear incision was made proximal to the lateral ulceration approximately 1.5 cm extending through the ulceration and then distally along the lateral aspect of the foot.  Incision was carried sharply down to the level of bone.  Fifth metatarsal was freed from the surrounding anatomy using sharp and blunt dissection and then was removed in [place].  Bone was taken from the fifth metatarsal base to be sent for culture.  The remainder of the fifth metatarsal bone was then passed off for pathology.  Devitalized necrotic tissue from the ulcerative site was then excisionally  debrided sharply using a rongeur down to the level of the deep tissues.  The wound was then thoroughly irrigated with 3 L of sterile saline under pulsed irrigation.  The distal aspect of the incision was closed using 3-0 nylon simple interrupted sutures.  Surgicel was placed into the proximal aspect of the wound due to some deeper bleeding.  The remainder of the wound was then closed using 3-0 nylon simple interrupted sutures with a figure of 8 suture through the previous ulcerative site.  Xeroform 4 x 4's ABDs and Kerlix applied to the right foot.  Esmarch bandage was released and blood flow noted return immediately to all digits.  A second Kerlix and Ace wrap was then applied for compression.  The patient was awakened and transported to the PACU having tolerated the anesthesia and procedure well.

## 2022-05-01 NOTE — Progress Notes (Signed)
Patient is slightly tachycardic, not symptomatic. Notified Dr. Barbra Sarks, per MD patient is okay to return to his room. No c/o pain or discomfort.

## 2022-05-01 NOTE — Interval H&P Note (Signed)
History and Physical Interval Note:  05/01/2022 4:39 PM  Daniel Ritter  has presented today for surgery, with the diagnosis of Infection.  The various methods of treatment have been discussed with the patient and family. After consideration of risks, benefits and other options for treatment, the patient has consented to  Procedure(s): IRRIGATION AND DEBRIDEMENT FOOT;SOFT TISSUE AND BONE (Right) as a surgical intervention.  The patient's history has been reviewed, patient examined, no change in status, stable for surgery.  I have reviewed the patient's chart and labs.  Questions were answered to the patient's satisfaction.     Durward Fortes

## 2022-05-01 NOTE — Assessment & Plan Note (Signed)
Continue home antihypertensives 

## 2022-05-01 NOTE — Consult Note (Signed)
Reason for Consult: Osteomyelitis with chronic ulceration right foot. Referring Physician: [name] is an 46 y.o. male.  HPI: This is an 46 year old male with history of diabetes and neuropathy who underwent right fifth ray resection this past summer.  Has developed an ulceration along the side of his foot that has not shown significant signs of healing over the last month or 2 and has worsened.  Recommended admission to the hospital for MRI and debridement of the ulcer and/or underlying bone.  Past Medical History:  Diagnosis Date   Allergy    seasonal   Anxiety    Diabetes mellitus without complication ([place])    GERD (gastroesophageal reflux disease)    H/O ETOH abuse    History of rectal abscess    Hypertension 2007   Neuropathy    Pancreatitis    history of, pt states due to alcohol   Seizures (Daniel Ritter) 08/2014   " two seizures in the hospital"   Stroke [facility]) 08/31/2013   Thrombocytopenia [facility])     Past Surgical History:  Procedure Laterality Date   AMPUTATION TOE Right 01/30/2022   Procedure: PARTIAL 5TH RAY AMUPTATION RIGHT FOOT;  Surgeon: Samara Deist, DPM;  Location: [facility] ORS;  Service: Podiatry;  Laterality: Right;   ANTERIOR CRUCIATE LIGAMENT REPAIR Left 1992, 1993   COLONOSCOPY N/A 09/04/2021   Procedure: COLONOSCOPY;  Surgeon: Toledo, Benay Pike, MD;  Location: [facility] ENDOSCOPY;  Service: Gastroenterology;  Laterality: N/A;  DM   INCISION AND DRAINAGE PERIRECTAL ABSCESS N/A 12/14/2015   Procedure: IRRIGATION AND DEBRIDEMENT PERIRECTAL ABSCESS;  Surgeon: Christene Lye, MD;  Location: [facility] ORS;  Service: General;  Laterality: N/A;   IRRIGATION AND DEBRIDEMENT FOOT Bilateral 01/30/2022   Procedure: IRRIGATION AND DEBRIDEMENT TENDON ULCER LEFT FOOT,  EXCISION OF 2ND TOE JOINT LEFT FOOT, IRRIGATION AND DEBRIDEMENT SUBCUTANEOUS ULCER RIGHT FOOT;  Surgeon: Samara Deist, DPM;  Location: [facility] ORS;  Service: Podiatry;  Laterality: Bilateral;   LOWER EXTREMITY  ANGIOGRAPHY Right 02/04/2022   Procedure: Lower Extremity Angiography;  Surgeon: Katha Cabal, MD;  Location: [place] CV LAB;  Service: Cardiovascular;  Laterality: Right;    Family History  Problem Relation Age of Onset   Hypertension Mother    Anxiety disorder Mother    Hypertension Father    Hypertension Brother    Leukemia Maternal Grandfather    Lung cancer Paternal Grandfather     Social History:  reports that he quit smoking about 2 years ago. His smoking use included cigarettes. He has a 4.50 pack-year smoking history. He quit smokeless tobacco use about 22 years ago.  His smokeless tobacco use included snuff. He reports current alcohol use. He reports that he does not currently use drugs after having used the following drugs: Marijuana.  Allergies: No Known Allergies  Medications: Scheduled:  ALPRAZolam  0.25 mg Oral BID   [START ON 05/02/2022] amLODipine  10 mg Oral Daily   [START ON 05/02/2022] DULoxetine  30 mg Oral Daily   [START ON 05/02/2022] lisinopril  20 mg Oral Daily   And   [START ON 05/02/2022] hydrochlorothiazide  12.5 mg Oral Daily   insulin aspart  0-15 Units Subcutaneous TID WC   levETIRAcetam  500 mg Oral BID   [START ON 05/02/2022] metoprolol succinate  100 mg Oral Daily   multivitamin with minerals  1 tablet Oral Daily   [START ON 05/02/2022] pantoprazole  40 mg Oral Daily    Results for orders placed or performed during the hospital  encounter of 05/01/22 (from the past 48 hour(s))  CBC with Differential/Platelet     Status: Abnormal   Collection Time: 05/01/22  1:00 PM  Result Value Ref Range   WBC 12.7 (H) 4.0 - 10.5 K/uL   RBC 4.61 4.22 - 5.81 MIL/uL   Hemoglobin 13.6 13.0 - 17.0 g/dL   HCT 40.4 39.0 - 52.0 %   MCV 87.6 80.0 - 100.0 fL   MCH 29.5 26.0 - 34.0 pg   MCHC 33.7 30.0 - 36.0 g/dL   RDW 15.3 11.5 - 15.5 %   Platelets 105 (L) 150 - 400 K/uL   nRBC 0.0 0.0 - 0.2 %   Neutrophils Relative % 72 %   Neutro Abs 9.1 (H) 1.7 -  7.7 K/uL   Lymphocytes Relative 16 %   Lymphs Abs 2.1 0.7 - 4.0 K/uL   Monocytes Relative 8 %   Monocytes Absolute 1.1 (H) 0.1 - 1.0 K/uL   Eosinophils Relative 1 %   Eosinophils Absolute 0.1 0.0 - 0.5 K/uL   Basophils Relative 1 %   Basophils Absolute 0.1 0.0 - 0.1 K/uL   Immature Granulocytes 2 %   Abs Immature Granulocytes 0.22 (H) 0.00 - 0.07 K/uL    Comment: Performed at [facility], [address] [place], [place] [address]  Comprehensive metabolic panel     Status: Abnormal   Collection Time: 05/01/22  1:00 PM  Result Value Ref Range   Sodium 137 135 - 145 mmol/L   Potassium 3.6 3.5 - 5.1 mmol/L   Chloride 104 98 - 111 mmol/L   CO2 18 (L) 22 - 32 mmol/L   Glucose, Bld 136 (H) 70 - 99 mg/dL    Comment: Glucose reference range applies only to samples taken after fasting for at least 8 hours.   BUN 7 6 - 20 mg/dL   Creatinine, Ser 0.58 (L) 0.61 - 1.24 mg/dL   Calcium 9.4 8.9 - 10.3 mg/dL   Total Protein 7.8 6.5 - 8.1 g/dL   Albumin 4.0 3.5 - 5.0 g/dL   AST 38 15 - 41 U/L   ALT 14 0 - 44 U/L   Alkaline Phosphatase 90 38 - 126 U/L   Total Bilirubin 0.5 0.3 - 1.2 mg/dL   GFR, Estimated >60 >60 mL/min    Comment: (NOTE) Calculated using the CKD-EPI Creatinine Equation (2021)    Anion gap 15 5 - 15    Comment: Performed at [facility], [address] [place], [place] [address]  Lactic acid, plasma     Status: Abnormal   Collection Time: 05/01/22  1:01 PM  Result Value Ref Range   Lactic Acid, Venous 6.0 (HH) 0.5 - 1.9 mmol/L    Comment: CRITICAL RESULT CALLED TO, READ BACK BY AND VERIFIED WITH 05/01/2022 AT 1341 TO Vennie Homans, ADC/SS Performed at [facility], [address] [place], [place] [address]   Protime-INR     Status: None   Collection Time: 05/01/22  1:01 PM  Result Value Ref Range   Prothrombin Time 13.4 11.4 - 15.2 seconds   INR 1.0 0.8 - 1.2    Comment: (NOTE) INR goal varies based on device and disease  states. Performed at [facility], [address] [place], [place] [address]   Urinalysis, Routine w reflex microscopic Urine, Clean Catch     Status: Abnormal   Collection Time: 05/01/22  2:05 PM  Result Value Ref Range   Color, Urine COLORLESS (A) YELLOW  APPearance CLEAR (A) CLEAR   Specific Gravity, Urine 1.002 (L) 1.005 - 1.030   pH 6.0 5.0 - 8.0   Glucose, UA NEGATIVE NEGATIVE mg/dL   Hgb urine dipstick NEGATIVE NEGATIVE   Bilirubin Urine NEGATIVE NEGATIVE   Ketones, ur NEGATIVE NEGATIVE mg/dL   Protein, ur NEGATIVE NEGATIVE mg/dL   Nitrite NEGATIVE NEGATIVE   Leukocytes,Ua NEGATIVE NEGATIVE    Comment: Performed at [facility], [address], [place], [place] [address]    DG Chest 2 View  Result Date: 05/01/2022 CLINICAL DATA:  Sepsis. EXAM: CHEST - 2 VIEW COMPARISON:  01/28/2022 FINDINGS: The lungs are clear without focal pneumonia, edema, pneumothorax or pleural effusion. The cardiopericardial silhouette is within normal limits for size. The visualized bony structures of the thorax are unremarkable. IMPRESSION: No active cardiopulmonary disease. Electronically Signed   By: Misty Stanley M.D.   On: 05/01/2022 14:19    Review of Systems  Constitutional:  Negative for chills and fever.  HENT:  Negative for sinus pain and sore throat.   Respiratory:  Negative for cough and shortness of breath.   Cardiovascular:  Negative for chest pain and palpitations.  Gastrointestinal:  Negative for nausea and vomiting.  Genitourinary:  Negative for frequency and urgency.  Musculoskeletal:  Negative for arthralgias.       Patient does relate amputation of the fifth toe and partial ray the summer.  Skin:        Has had a chronic draining ulceration along the outside of his right foot.  Recently has had some increased redness and swelling which has improved some with oral antibiotics.  Neurological:        Significant neuropathy associated with his diabetes   Psychiatric/Behavioral:  Negative for confusion. The patient is not nervous/anxious.    Blood pressure 137/84, pulse 92, temperature 98.8 F (37.1 C), resp. rate 18, SpO2 98 %. Physical Exam Cardiovascular:     Comments: DP and PT pulses are palpable bilateral. Musculoskeletal:     Comments: Adequate range of motion of the pedal joints.  Previous right fifth partial ray resection.  Muscle testing deferred.  Skin:    Comments: The skin is warm dry and supple.  Some erythema and edema in the right foot with a full-thickness ulcerative area at the lateral base of the fifth metatarsal.  Neurological:     Comments: Loss of protective threshold with monofilament wire distally in the forefoot and toes.      Assessment/Plan: Assessment: 1.  Full-thickness ulceration with osteomyelitis right fifth metatarsal. 2.  Diabetes with associated neuropathy. 3.  Cellulitis right foot.  Plan: Discussed with the patient that the MRI did show bone infection along the remaining portion of his fifth metatarsal beneath the ulceration.  This will require resection of the remaining portion of the bone.  Discussed with the patient possible risks and complications of the procedure including but not limited to inability of the wound to heal due to continued infection or due to his diabetes.  Discussed the risk of increased foot deformity after releasing the peroneal tendon from the fifth metatarsal base.  No guarantees could be given as to the outcome of surgery.  Questions invited and answered.  Patient has been n.p.o. since last night.  Consent for debridement of soft tissue and bone right foot.  Plan for surgery this evening  Daniel Ritter 05/01/2022, 4:07 PM

## 2022-05-01 NOTE — Anesthesia Procedure Notes (Signed)
Procedure Name: LMA Insertion Date/Time: 05/01/2022 5:06 PM  Performed by: Rolla Plate, CRNAPre-anesthesia Checklist: Patient identified, Patient being monitored, Timeout performed, Emergency Drugs available and Suction available Patient Re-evaluated:Patient Re-evaluated prior to induction Oxygen Delivery Method: Circle system utilized Preoxygenation: Pre-oxygenation with 100% oxygen Induction Type: IV induction Ventilation: Mask ventilation without difficulty LMA: LMA inserted LMA Size: 5.0 Tube type: Oral Number of attempts: 1 Placement Confirmation: positive ETCO2 and breath sounds checked- equal and bilateral Tube secured with: Tape Dental Injury: Teeth and Oropharynx as per pre-operative assessment

## 2022-05-02 ENCOUNTER — Encounter: Payer: Self-pay | Admitting: Podiatry

## 2022-05-02 DIAGNOSIS — I10 Essential (primary) hypertension: Secondary | ICD-10-CM

## 2022-05-02 DIAGNOSIS — M8610 Other acute osteomyelitis, unspecified site: Secondary | ICD-10-CM | POA: Diagnosis not present

## 2022-05-02 DIAGNOSIS — F419 Anxiety disorder, unspecified: Secondary | ICD-10-CM

## 2022-05-02 DIAGNOSIS — R569 Unspecified convulsions: Secondary | ICD-10-CM | POA: Diagnosis not present

## 2022-05-02 DIAGNOSIS — F32A Depression, unspecified: Secondary | ICD-10-CM

## 2022-05-02 DIAGNOSIS — E1100 Type 2 diabetes mellitus with hyperosmolarity without nonketotic hyperglycemic-hyperosmolar coma (NKHHC): Secondary | ICD-10-CM

## 2022-05-02 LAB — GLUCOSE, CAPILLARY
Glucose-Capillary: 173 mg/dL — ABNORMAL HIGH (ref 70–99)
Glucose-Capillary: 155 mg/dL — ABNORMAL HIGH (ref 70–99)
Glucose-Capillary: 147 mg/dL — ABNORMAL HIGH (ref 70–99)

## 2022-05-02 LAB — BASIC METABOLIC PANEL
Anion gap: 10 (ref 5–15)
BUN: 10 mg/dL (ref 6–20)
CO2: 25 mmol/L (ref 22–32)
Calcium: 8.6 mg/dL — ABNORMAL LOW (ref 8.9–10.3)
Chloride: 104 mmol/L (ref 98–111)
Creatinine, Ser: 0.79 mg/dL (ref 0.61–1.24)
GFR, Estimated: >60 mL/min (ref >60)
Glucose, Bld: 179 mg/dL — ABNORMAL HIGH (ref 70–99)
Potassium: 4.9 mmol/L (ref 3.5–5.1)
Sodium: 139 mmol/L (ref 135–145)

## 2022-05-02 LAB — CBC
HCT: 39.4 % (ref 39.0–52.0)
Hemoglobin: 13.3 g/dL (ref 13.0–17.0)
MCH: 30 pg (ref 26.0–34.0)
MCHC: 33.8 g/dL (ref 30.0–36.0)
MCV: 88.9 fL (ref 80.0–100.0)
Platelets: 99 10*3/uL — ABNORMAL LOW (ref 150–400)
RBC: 4.43 MIL/uL (ref 4.22–5.81)
RDW: 14.8 % (ref 11.5–15.5)
WBC: 9.7 10*3/uL (ref 4.0–10.5)
nRBC: 0 % (ref 0.0–0.2)

## 2022-05-02 MED ORDER — VANCOMYCIN HCL 2000 MG/400ML IV SOLN
2000.0000 mg | Freq: Once | INTRAVENOUS | Status: AC
  Administered 2022-05-02: 2000 mg via INTRAVENOUS
  Filled 2022-05-02: qty 400

## 2022-05-02 NOTE — Plan of Care (Signed)
  Problem: Education: Goal: Ability to describe self-care measures that may prevent or decrease complications (Diabetes Survival Skills Education) will improve Outcome: Progressing Goal: Individualized Educational Video(s) Outcome: Progressing   Problem: Coping: Goal: Ability to adjust to condition or change in health will improve Outcome: Progressing   Problem: Fluid Volume: Goal: Ability to maintain a balanced intake and output will improve Outcome: Progressing   Problem: Health Behavior/Discharge Planning: Goal: Ability to identify and utilize available resources and services will improve Outcome: Progressing Goal: Ability to manage health-related needs will improve Outcome: Progressing   Problem: Metabolic: Goal: Ability to maintain appropriate glucose levels will improve Outcome: Progressing   Problem: Nutritional: Goal: Maintenance of adequate nutrition will improve Outcome: Progressing Goal: Progress toward achieving an optimal weight will improve Outcome: Progressing   Problem: Skin Integrity: Goal: Risk for impaired skin integrity will decrease Outcome: Progressing   Problem: Tissue Perfusion: Goal: Adequacy of tissue perfusion will improve Outcome: Progressing   Problem: Education: Goal: Knowledge of General Education information will improve Description: Including pain rating scale, medication(s)/side effects and non-pharmacologic comfort measures Outcome: Progressing   Problem: Health Behavior/Discharge Planning: Goal: Ability to manage health-related needs will improve Outcome: Progressing   Problem: Activity: Goal: Risk for activity intolerance will decrease Outcome: Progressing   Problem: Nutrition: Goal: Adequate nutrition will be maintained Outcome: Progressing   Problem: Coping: Goal: Level of anxiety will decrease Outcome: Progressing   Problem: Elimination: Goal: Will not experience complications related to bowel motility Outcome:  Progressing Goal: Will not experience complications related to urinary retention Outcome: Progressing   Problem: Pain Managment: Goal: General experience of comfort will improve Outcome: Progressing   Problem: Safety: Goal: Ability to remain free from injury will improve Outcome: Progressing   Problem: Skin Integrity: Goal: Risk for impaired skin integrity will decrease Outcome: Progressing

## 2022-05-02 NOTE — Progress Notes (Signed)
PROGRESS NOTE  Daniel Ritter    DOB: 11/25/1975, 46 y.o.  NLZ:[id number]    Code Status: Full Code   DOA: 05/01/2022   LOS: 1   Brief hospital course  Daniel Ritter is a 46 y.o. male with a PMH significant for recent osteomyelitis s/p fifth ray amputation, type 2 diabetes with peripheral neuropathy, hypertension, anxiety with depression, seizures who presents to the ED with a right toe wound.  They presented from home to the ED on 05/01/2022 with osteomyelitis of same R 5th ray that was previously partially amputated.  In the ED, it was found that they had stable vitals with mild tachycardia.   They were initially treated with chronic medications, flagyl, CTX, and vancomycin. Podiatry was consulted for further management.    Patient was admitted to medicine service for further workup and management of R 5th metatarsal osteomyelitis as outlined in detail below.  05/02/22 -POD#1 10/26 debridement  Assessment & Plan  Principal Problem:   Acute osteomyelitis ([place]) Active Problems:   Lactic acidosis   Type 2 diabetes mellitus (HCC)   Essential hypertension   Thrombocytopenia (HCC)   Seizures (HCC)   Anxiety and depression  Acute osteomyelitis R 5th metatarsal s/p debridement 10/26 and doing well Previously treated with Zosyn for 4-week course as blood cultures grew Peptostreptococcus and group B strep. Wound cultures grew Proteus and MSSA - analgesia PRN - dressing changes - podiatry following, appreciate your care - f/u cultures and can deescalate Abx   Type 2 diabetes mellitus (HCC)-A1c 5.2   Essential hypertension - Continue home antihypertensives   Thrombocytopenia (HCC) - no signs of bleeding. Continue to monitor   Seizures ([place]) - Continue home Keppra   Anxiety and depression - Continue home Xanax and duloxetine  Body mass index is 29.03 kg/m.  VTE ppx: SCDs Start: 05/01/22 1420   Diet:     Diet   Diet Carb Modified Fluid consistency: Thin; Room  service appropriate? Yes   Consultants: Podiatry  Subjective 05/02/22    Pt reports minimal pain. He denies complaints or concerns. He would like to go home when possible.    Objective   Vitals:   05/01/22 1901 05/01/22 2041 05/02/22 0012 05/02/22 0419  BP: 132/84 118/87 (!) 121/93 112/85  Pulse: (!) 104 90 84 83  Resp: '16 17 17 17  '$ Temp: 98.2 F (36.8 C) 98.2 F (36.8 C) 98 F (36.7 C) (!) 97.4 F (36.3 C)  TempSrc:      SpO2: 95% 100% 98% 99%  Weight:      Height:        Intake/Output Summary (Last 24 hours) at 05/02/2022 0743 Last data filed at 05/02/2022 0356 Gross per 24 hour  Intake 406.68 ml  Output 550 ml  Net -143.32 ml   Filed Weights   05/01/22 1623  Weight: 99.8 kg     Physical Exam:  General: awake, alert, NAD HEENT: atraumatic, clear conjunctiva, anicteric sclera, MMM, hearing grossly normal Respiratory: normal respiratory effort. Cardiovascular: quick capillary refill Gastrointestinal: soft, NT, ND Nervous: A&O x3. no gross focal neurologic deficits, normal speech Extremities: moves all equally, no edema, normal tone Skin: R foot wrapped with clean dry dressing. Distal tissue warm with good sensation, motor Psychiatry: normal mood, congruent affect  Labs   I have personally reviewed the following labs and imaging studies CBC    Component Value Date/Time   WBC 9.7 05/02/2022 0543   RBC 4.43 05/02/2022 0543   HGB 13.3 05/02/2022 0543  HGB 13.9 10/30/2011 1123   HCT 39.4 05/02/2022 0543   HCT 37.0 (L) 10/10/2011 1407   PLT 99 (L) 05/02/2022 0543   PLT 191 10/30/2011 1123   MCV 88.9 05/02/2022 0543   MCV 89 10/10/2011 1407   MCH 30.0 05/02/2022 0543   MCHC 33.8 05/02/2022 0543   RDW 14.8 05/02/2022 0543   RDW 13.7 10/10/2011 1407   LYMPHSABS 2.1 05/01/2022 1300   LYMPHSABS 2.2 10/10/2011 1407   MONOABS 1.1 (H) 05/01/2022 1300   MONOABS 0.7 10/10/2011 1407   EOSABS 0.1 05/01/2022 1300   EOSABS 0.6 10/10/2011 1407   BASOSABS 0.1  05/01/2022 1300   BASOSABS 0.3 (H) 10/10/2011 1407      Latest Ref Rng & Units 05/02/2022    5:43 AM 05/01/2022    1:00 PM 04/29/2022   11:55 AM  BMP  Glucose 70 - 99 mg/dL 179  136  124   BUN 6 - 20 mg/dL '10  7  11   '$ Creatinine 0.61 - 1.24 mg/dL 0.79  0.58  0.62   Sodium 135 - 145 mmol/L 139  137  136   Potassium 3.5 - 5.1 mmol/L 4.9  3.6  3.8   Chloride 98 - 111 mmol/L 104  104  103   CO2 22 - 32 mmol/L '25  18  21   '$ Calcium 8.9 - 10.3 mg/dL 8.6  9.4  8.9     DG Chest 2 View  Result Date: 05/01/2022 CLINICAL DATA:  Sepsis. EXAM: CHEST - 2 VIEW COMPARISON:  01/28/2022 FINDINGS: The lungs are clear without focal pneumonia, edema, pneumothorax or pleural effusion. The cardiopericardial silhouette is within normal limits for size. The visualized bony structures of the thorax are unremarkable. IMPRESSION: No active cardiopulmonary disease. Electronically Signed   By: Misty Stanley M.D.   On: 05/01/2022 14:19    Disposition Plan & Communication  Patient status: Inpatient  Admitted From: Home Planned disposition location: Home Anticipated discharge date: 10/28 pending podiatry clearance   Family Communication: none    Author: Richarda Osmond, DO [facility] 05/02/2022, 7:43 AM   Available by Epic secure chat 7AM-7PM. If 7PM-7AM, please contact night-coverage.  [facility] contact information found on [email or web address].

## 2022-05-02 NOTE — Progress Notes (Signed)
Pts BP 113/83 HR 86, MD notified, per MD hold morning BP meds.

## 2022-05-02 NOTE — Evaluation (Addendum)
Physical Therapy Evaluation Patient Details Name: Daniel Ritter MRN: [id number] DOB: 1976/01/30 Today's Date: 05/02/2022  History of Present Illness  46 yo male s/p irrigation and debridement of right foot. PMH includes DM, peropheral neuropathy, HTN, seizures, thrombocytopenia, and stroke.  Clinical Impression  Pt supine in bed upon PT entry with no pain and motivated to ambulate. Supine<>EOB with supervision. Sit<>Stand with CGA and RW. Pt educated on minimal WB precautions and wearing his Orthowedge shoe with pressure only in the heel and RW for assistance. Pt ambulated 30 feet with CGA and RW. Pt able to demonstrate and verbalize understanding of precautions and WB restriction. Pt modI for functional activities, no safety concerns, or additional education needs. Acute PT sign off.       Recommendations for follow up therapy are one component of a multi-disciplinary discharge planning process, led by the attending physician.  Recommendations may be updated based on patient status, additional functional criteria and insurance authorization.  Follow Up Recommendations No PT follow up      Assistance Recommended at Discharge PRN  Patient can return home with the following  A little help with bathing/dressing/bathroom;Assist for transportation;Help with stairs or ramp for entrance;Assistance with cooking/housework    Equipment Recommendations None recommended by PT (pt sister has RW)  Recommendations for Other Services       Functional Status Assessment       Precautions / Restrictions Precautions Precautions: Fall;Other (comment) Precaution Comments: orthowedge shoe Restrictions Weight Bearing Restrictions: Yes RLE Weight Bearing: Partial weight bearing RLE Partial Weight Bearing Percentage or Pounds: Patient may be minimal weightbearing on the right foot using his OrthoWedge shoe and walker with pressure only on the heel.      Mobility  Bed Mobility Overal bed mobility:  Needs Assistance Bed Mobility: Supine to Sit, Sit to Supine     Supine to sit: Supervision Sit to supine: Supervision   General bed mobility comments: head of bed elevated    Transfers Overall transfer level: Needs assistance Equipment used: Rolling walker (2 wheels) Transfers: Sit to/from Stand Sit to Stand: Min guard                Ambulation/Gait Ambulation/Gait assistance: Min guard Gait Distance (Feet): 30 Feet Assistive device: Rolling walker (2 wheels)         General Gait Details: decreased stride length bilaterally  Stairs            Wheelchair Mobility    Modified Rankin (Stroke Patients Only)       Balance Overall balance assessment: Needs assistance Sitting-balance support: Feet supported Sitting balance-Leahy Scale: Good     Standing balance support: Bilateral upper extremity supported Standing balance-Leahy Scale: Fair Standing balance comment: balance initially poor, but progressed to fair                             Pertinent Vitals/Pain Pain Assessment Pain Assessment: No/denies pain    Home Living Family/patient expects to be discharged to:: Private residence Living Arrangements: Parent Available Help at Discharge: Family;Personal care attendant;Available PRN/intermittently Type of Home: House Home Access: Ramped entrance       Home Layout: One level Home Equipment: Rollator (4 wheels);Wheelchair - manual;Grab bars - tub/shower;Hand held shower head;Grab bars - toilet      Prior Function Prior Level of Function : Independent/Modified Independent             Mobility Comments: 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, cleaning, and errands       Hand Dominance        Extremity/Trunk Assessment   Upper Extremity Assessment Upper Extremity Assessment: Overall WFL for tasks assessed    Lower Extremity Assessment Lower Extremity Assessment: Generalized weakness    Cervical / Trunk  Assessment Cervical / Trunk Assessment: Normal  Communication   Communication: No difficulties  Cognition Arousal/Alertness: Awake/alert Behavior During Therapy: WFL for tasks assessed/performed Overall Cognitive Status: Within Functional Limits for tasks assessed                                          General Comments      Exercises     Assessment/Plan    PT Assessment Patient does not need any further PT services  PT Problem List Decreased strength;Decreased mobility;Decreased safety awareness;Decreased range of motion;Decreased activity tolerance;Decreased skin integrity;Decreased balance;Decreased knowledge of use of DME       PT Treatment Interventions DME instruction;Therapeutic exercise;Gait training;Balance training;Stair training;Neuromuscular re-education;Functional mobility training;Therapeutic activities;Patient/family education    PT Goals (Current goals can be found in the Care Plan section)       Frequency       Co-evaluation               AM-PAC PT "6 Clicks" Mobility  Outcome Measure Help needed turning from your back to your side while in a flat bed without using bedrails?: None Help needed moving from lying on your back to sitting on the side of a flat bed without using bedrails?: None Help needed moving to and from a bed to a chair (including a wheelchair)?: None Help needed standing up from a chair using your arms (e.g., wheelchair or bedside chair)?: None Help needed to walk in hospital room?: None Help needed climbing 3-5 steps with a railing? : A Little 6 Click Score: 23    End of Session Equipment Utilized During Treatment: Gait belt Activity Tolerance: Patient tolerated treatment well;No increased pain Patient left: in bed;with call bell/phone within reach Nurse Communication: Mobility status PT Visit Diagnosis: Unsteadiness on feet (R26.81);Other abnormalities of gait and mobility (R26.89);Muscle weakness  (generalized) (M62.81);Difficulty in walking, not elsewhere classified (R26.2)    Time: 0511-0211 PT Time Calculation (min) (ACUTE ONLY): 20 min   Charges:   PT Evaluation $PT Eval Low Complexity: 1 Low PT Treatments $Therapeutic Activity: 8-22 mins          Carles Florea O'Daniel,SPT 05/02/2022, 3:15 PM

## 2022-05-02 NOTE — Progress Notes (Signed)
1 Day Post-Op   Subjective/Chief Complaint: Patient seen.  No complaints with his foot.  Not really having any significant pain at this point   Objective: Vital signs in last 24 hours: Temp:  [97.4 F (36.3 C)-98.8 F (37.1 C)] 98.3 F (36.8 C) (10/27 0804) Pulse Rate:  [83-116] 86 (10/27 0804) Resp:  [13-18] 16 (10/27 0804) BP: (112-137)/(70-93) 113/83 (10/27 0804) SpO2:  [95 %-100 %] 98 % (10/27 0804) Weight:  [99.8 kg] 99.8 kg (10/26 1623) Last BM Date : 05/01/22  Intake/Output from previous day: 10/26 0701 - 10/27 0700 In: 406.7 [IV Piggyback:406.7] Out: 550 [Urine:550] Intake/Output this shift: No intake/output data recorded.  Bandage on the right foot is dry and intact.  Upon removal there is some moderate bleeding.  No signs of any purulence.  Edema improved with some continued mild erythema but overall appears stable.  Incision appears well coapted.  Lab Results:  Recent Labs    05/01/22 1300 05/02/22 0543  WBC 12.7* 9.7  HGB 13.6 13.3  HCT 40.4 39.4  PLT 105* 99*   BMET Recent Labs    05/01/22 1300 05/02/22 0543  NA 137 139  K 3.6 4.9  CL 104 104  CO2 18* 25  GLUCOSE 136* 179*  BUN 7 10  CREATININE 0.58* 0.79  CALCIUM 9.4 8.6*   PT/INR Recent Labs    05/01/22 1301  LABPROT 13.4  INR 1.0   ABG No results for input(s): "PHART", "HCO3" in the last 72 hours.  Invalid input(s): "PCO2", "PO2"  Studies/Results: DG Chest 2 View  Result Date: 05/01/2022 CLINICAL DATA:  Sepsis. EXAM: CHEST - 2 VIEW COMPARISON:  01/28/2022 FINDINGS: The lungs are clear without focal pneumonia, edema, pneumothorax or pleural effusion. The cardiopericardial silhouette is within normal limits for size. The visualized bony structures of the thorax are unremarkable. IMPRESSION: No active cardiopulmonary disease. Electronically Signed   By: Misty Stanley M.D.   On: 05/01/2022 14:19    Anti-infectives: Anti-infectives (From admission, onward)    Start     Dose/Rate  Route Frequency Ordered Stop   05/02/22 0330  vancomycin (VANCOREADY) IVPB 2000 mg/400 mL        2,000 mg 200 mL/hr over 120 Minutes Intravenous  Once 05/02/22 0242 05/02/22 0518   05/01/22 1315  cefTRIAXone (ROCEPHIN) 2 g in sodium chloride 0.9 % 100 mL IVPB        2 g 200 mL/hr over 30 Minutes Intravenous Every 24 hours 05/01/22 1301 05/08/22 1314   05/01/22 1315  metroNIDAZOLE (FLAGYL) IVPB 500 mg        500 mg 100 mL/hr over 60 Minutes Intravenous Every 12 hours 05/01/22 1301 05/08/22 1314   05/01/22 1315  vancomycin (VANCOREADY) IVPB 2000 mg/400 mL  Status:  Discontinued        2,000 mg 200 mL/hr over 120 Minutes Intravenous  Once 05/01/22 1306 05/02/22 0242       Assessment/Plan: s/p Procedure(s): IRRIGATION AND DEBRIDEMENT FOOT;SOFT TISSUE AND BONE (Right) Assessment: Stable status post debridement soft tissue and bone right foot.  Plan: Betadine gauze and a bulky sterile dressing reapplied to the right foot.  Patient may have bathroom privileges with assistance using his wedge surgical shoe with pressure only on the heel.  May begin physical therapy for training using the wedge shoe with pressure on the heel.  The debridement hopefully should have been curative with removal of the entire remaining portion of the fifth metatarsal.  Would expect that he should be stable for  discharge with oral antibiotics with adjustment as needed when the cultures come back.  Plan for dressing change with reassessment tomorrow.  LOS: 1 day    Daniel Ritter 05/02/2022

## 2022-05-02 NOTE — Plan of Care (Signed)

## 2022-05-03 DIAGNOSIS — I10 Essential (primary) hypertension: Secondary | ICD-10-CM | POA: Diagnosis not present

## 2022-05-03 DIAGNOSIS — F419 Anxiety disorder, unspecified: Secondary | ICD-10-CM | POA: Diagnosis not present

## 2022-05-03 DIAGNOSIS — E872 Acidosis, unspecified: Secondary | ICD-10-CM | POA: Diagnosis not present

## 2022-05-03 DIAGNOSIS — M8610 Other acute osteomyelitis, unspecified site: Secondary | ICD-10-CM | POA: Diagnosis not present

## 2022-05-03 MED ORDER — DOXYCYCLINE HYCLATE 50 MG PO CAPS: 50.0000 mg | ORAL_CAPSULE | Freq: Two times a day (BID) | ORAL | 0 refills | Status: AC

## 2022-05-03 NOTE — Plan of Care (Signed)
Problem: Education: Goal: Ability to describe self-care measures that may prevent or decrease complications (Diabetes Survival Skills Education) will improve 05/03/2022 1143 by Orvan Seen, RN Outcome: Completed/Met 05/03/2022 1143 by Orvan Seen, RN Outcome: Progressing Goal: Individualized Educational Video(s) 05/03/2022 1143 by Orvan Seen, RN Outcome: Completed/Met 05/03/2022 1143 by Orvan Seen, RN Outcome: Progressing   Problem: Coping: Goal: Ability to adjust to condition or change in health will improve 05/03/2022 1143 by Orvan Seen, RN Outcome: Completed/Met 05/03/2022 1143 by Orvan Seen, RN Outcome: Progressing   Problem: Fluid Volume: Goal: Ability to maintain a balanced intake and output will improve 05/03/2022 1143 by Orvan Seen, RN Outcome: Completed/Met 05/03/2022 1143 by Orvan Seen, RN Outcome: Progressing   Problem: Health Behavior/Discharge Planning: Goal: Ability to identify and utilize available resources and services will improve 05/03/2022 1143 by Orvan Seen, RN Outcome: Completed/Met 05/03/2022 1143 by Orvan Seen, RN Outcome: Progressing Goal: Ability to manage health-related needs will improve 05/03/2022 1143 by Orvan Seen, RN Outcome: Completed/Met 05/03/2022 1143 by Orvan Seen, RN Outcome: Progressing   Problem: Metabolic: Goal: Ability to maintain appropriate glucose levels will improve 05/03/2022 1143 by Orvan Seen, RN Outcome: Completed/Met 05/03/2022 1143 by Orvan Seen, RN Outcome: Progressing   Problem: Nutritional: Goal: Maintenance of adequate nutrition will improve 05/03/2022 1143 by Orvan Seen, RN Outcome: Completed/Met 05/03/2022 1143 by Orvan Seen, RN Outcome: Progressing Goal: Progress toward achieving an optimal weight will improve 05/03/2022 1143 by Orvan Seen, RN Outcome: Completed/Met 05/03/2022 1143 by Orvan Seen,  RN Outcome: Progressing   Problem: Skin Integrity: Goal: Risk for impaired skin integrity will decrease 05/03/2022 1143 by Orvan Seen, RN Outcome: Completed/Met 05/03/2022 1143 by Orvan Seen, RN Outcome: Progressing   Problem: Tissue Perfusion: Goal: Adequacy of tissue perfusion will improve 05/03/2022 1143 by Orvan Seen, RN Outcome: Completed/Met 05/03/2022 1143 by Orvan Seen, RN Outcome: Progressing   Problem: Education: Goal: Knowledge of General Education information will improve Description: Including pain rating scale, medication(s)/side effects and non-pharmacologic comfort measures 05/03/2022 1143 by Orvan Seen, RN Outcome: Completed/Met 05/03/2022 1143 by Orvan Seen, RN Outcome: Progressing   Problem: Health Behavior/Discharge Planning: Goal: Ability to manage health-related needs will improve 05/03/2022 1143 by Orvan Seen, RN Outcome: Completed/Met 05/03/2022 1143 by Orvan Seen, RN Outcome: Progressing   Problem: Clinical Measurements: Goal: Ability to maintain clinical measurements within normal limits will improve 05/03/2022 1143 by Orvan Seen, RN Outcome: Completed/Met 05/03/2022 1143 by Orvan Seen, RN Outcome: Progressing Goal: Will remain free from infection 05/03/2022 1143 by Orvan Seen, RN Outcome: Completed/Met 05/03/2022 1143 by Orvan Seen, RN Outcome: Progressing Goal: Diagnostic test results will improve 05/03/2022 1143 by Orvan Seen, RN Outcome: Completed/Met 05/03/2022 1143 by Orvan Seen, RN Outcome: Progressing Goal: Respiratory complications will improve 05/03/2022 1143 by Orvan Seen, RN Outcome: Completed/Met 05/03/2022 1143 by Orvan Seen, RN Outcome: Progressing Goal: Cardiovascular complication will be avoided 05/03/2022 1143 by Orvan Seen, RN Outcome: Completed/Met 05/03/2022 1143 by Orvan Seen, RN Outcome: Progressing    Problem: Activity: Goal: Risk for activity intolerance will decrease 05/03/2022 1143 by Orvan Seen, RN Outcome: Completed/Met 05/03/2022 1143 by Orvan Seen, RN Outcome: Progressing   Problem: Nutrition: Goal: Adequate nutrition will be maintained 05/03/2022 1143 by Orvan Seen, RN Outcome: Completed/Met 05/03/2022 1143 by Orvan Seen, RN Outcome: Progressing  Problem: Coping: Goal: Level of anxiety will decrease 05/03/2022 1143 by Orvan Seen, RN Outcome: Completed/Met 05/03/2022 1143 by Orvan Seen, RN Outcome: Progressing   Problem: Elimination: Goal: Will not experience complications related to bowel motility 05/03/2022 1143 by Orvan Seen, RN Outcome: Completed/Met 05/03/2022 1143 by Orvan Seen, RN Outcome: Progressing Goal: Will not experience complications related to urinary retention 05/03/2022 1143 by Orvan Seen, RN Outcome: Completed/Met 05/03/2022 1143 by Orvan Seen, RN Outcome: Progressing   Problem: Pain Managment: Goal: General experience of comfort will improve 05/03/2022 1143 by Orvan Seen, RN Outcome: Completed/Met 05/03/2022 1143 by Orvan Seen, RN Outcome: Progressing   Problem: Safety: Goal: Ability to remain free from injury will improve 05/03/2022 1143 by Orvan Seen, RN Outcome: Completed/Met 05/03/2022 1143 by Orvan Seen, RN Outcome: Progressing   Problem: Skin Integrity: Goal: Risk for impaired skin integrity will decrease 05/03/2022 1143 by Orvan Seen, RN Outcome: Completed/Met 05/03/2022 1143 by Orvan Seen, RN Outcome: Progressing

## 2022-05-03 NOTE — Discharge Summary (Signed)
Physician Discharge Summary  Patient: Daniel Ritter:[id number] DOB: 1976/04/29   Code Status: Prior Admit date: 05/01/2022 Discharge date: 05/03/2022 Disposition: Home, No home health services recommended PCP: Preston Fleeting, MD  Recommendations for Outpatient Follow-up:  Follow up with Podiatry as directed  Discharge Diagnoses:  Principal Problem:   Acute osteomyelitis (HCC) Active Problems:   Lactic acidosis   Type 2 diabetes mellitus (HCC)   Essential hypertension   Thrombocytopenia (HCC)   Seizures (HCC)   Anxiety and depression  Brief Hospital Course Summary: Daniel Ritter is a 46 y.o. male with a PMH significant for recent osteomyelitis s/p fifth ray amputation, type 2 diabetes with peripheral neuropathy, hypertension, anxiety with depression, seizures who presents to the ED with a right toe wound.   They presented from home to the ED on 05/01/2022 with osteomyelitis of same R 5th ray that was previously partially amputated.   In the ED, it was found that they had stable vitals with mild tachycardia.    They were initially treated with chronic medications, flagyl, CTX, and vancomycin. Podiatry was consulted for further management.   Underwent debridement 10/26. Patient tolerated well. His antibiotics were transitioned to PO doxycycline per podiatry recommendation awaiting wound cultures. He did not experience systemic symptoms and had well controlled pain.  Discharged on POD #2 with podiatry follow up.   All other chronic conditions were treated with home medications.   Discharge Condition: Good, improved Recommended discharge diet: Regular healthy diet  Consultations: Podiatry   Procedures/Studies: R foot debridement.   Allergies as of 05/03/2022   No Known Allergies      Medication List     STOP taking these medications    cephALEXin 500 MG capsule Commonly known as: KEFLEX       TAKE these medications    acetaminophen 325 MG  tablet Commonly known as: TYLENOL Take 2 tablets (650 mg total) by mouth every 6 (six) hours as needed for mild pain (or Fever >/= 101).   ALPRAZolam 0.25 MG tablet Commonly known as: XANAX Take 0.25 mg by mouth 2 (two) times daily.   amLODipine 10 MG tablet Commonly known as: NORVASC Take 10 mg by mouth daily as needed.   doxycycline 50 MG capsule Commonly known as: VIBRAMYCIN Take 1 capsule (50 mg total) by mouth 2 (two) times daily for 5 days.   DULoxetine 30 MG capsule Commonly known as: CYMBALTA Take 30 mg by mouth daily.   Januvia 100 MG tablet Generic drug: sitaGLIPtin Take 100 mg by mouth daily.   levETIRAcetam 500 MG tablet Commonly known as: KEPPRA Take 1 tablet (500 mg total) by mouth 2 (two) times daily.   lisinopril-hydrochlorothiazide 20-12.5 MG tablet Commonly known as: ZESTORETIC Take 1 tablet by mouth daily.   metFORMIN 1000 MG tablet Commonly known as: GLUCOPHAGE Take 1,000 mg by mouth 2 (two) times daily with a meal.   metoprolol succinate 100 MG 24 hr tablet Commonly known as: TOPROL-XL Take 100 mg by mouth daily. Take with or immediately following a meal.   omeprazole 20 MG capsule Commonly known as: PRILOSEC Take 20 mg by mouth 2 (two) times daily before a meal.         Subjective   Pt reports no pain. He is able to ambulate with crutches. Ready to go home.  Objective  Blood pressure (!) 138/100, pulse (!) 108, temperature 98 F (36.7 C), resp. rate 16, height 6\' 1"  (1.854 m), weight 99.8 kg, SpO2 97 %.  General: Pt is alert, awake, not in acute distress Cardiovascular: RRR, S1/S2 +, no rubs, no gallops Respiratory: CTA bilaterally, no wheezing, no rhonchi Abdominal: Soft, NT, ND, bowel sounds + Extremities: no edema, no cyanosis. Surgical site clean and wrapped with dry dressing   The results of significant diagnostics from this hospitalization (including imaging, microbiology, ancillary and laboratory) are listed below for  reference.   Imaging studies: DG Chest 2 View  Result Date: 05/01/2022 CLINICAL DATA:  Sepsis. EXAM: CHEST - 2 VIEW COMPARISON:  01/28/2022 FINDINGS: The lungs are clear without focal pneumonia, edema, pneumothorax or pleural effusion. The cardiopericardial silhouette is within normal limits for size. The visualized bony structures of the thorax are unremarkable. IMPRESSION: No active cardiopulmonary disease. Electronically Signed   By: Kennith Center M.D.   On: 05/01/2022 14:19   MR FOOT RIGHT WO CONTRAST  Result Date: 04/29/2022 CLINICAL DATA:  Right foot wound.  History of prior amputation. EXAM: MRI OF THE RIGHT FOREFOOT WITHOUT CONTRAST TECHNIQUE: Multiplanar, multisequence MR imaging of the right forefoot was performed. No intravenous contrast was administered. COMPARISON:  MRI right foot dated January 29, 2022. FINDINGS: Bones/Joint/Cartilage Prior fifth ray amputation. Mild patchy marrow edema throughout the residual fifth metatarsal, most prominent in the tuberosity, adjacent to a soft tissue ulcer. No fracture or dislocation. 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 No joint effusion. Ligaments First through fourth MCP and IP joint collateral ligaments are intact. Muscles and Tendons Postsurgical changes of the fifth flexor and extensor tendons. Flexor and extensor tendons are otherwise intact. Increased T2 signal within the intrinsic muscles of the forefoot, nonspecific, but likely related to diabetic muscle changes. Soft tissue Soft tissue ulceration at the lateral aspect of the fifth metatarsal base. Dorsal foot soft tissue swelling. No fluid collection. No soft tissue mass. IMPRESSION: 1. Status post fifth ray amputation. New soft tissue ulcer at the lateral aspect of the fifth metatarsal base with underlying osteomyelitis of the residual fifth metatarsal. No abscess. Electronically Signed   By: Obie Dredge M.D.   On: 04/29/2022 14:48    Labs: Basic Metabolic  Panel: Recent Labs  Lab 04/29/22 1155 05/01/22 1300 05/02/22 0543  NA 136 137 139  K 3.8 3.6 4.9  CL 103 104 104  CO2 21* 18* 25  GLUCOSE 124* 136* 179*  BUN 11 7 10   CREATININE 0.62 0.58* 0.79  CALCIUM 8.9 9.4 8.6*   CBC: Recent Labs  Lab 04/29/22 1155 05/01/22 1300 05/02/22 0543  WBC 12.2* 12.7* 9.7  NEUTROABS  --  9.1*  --   HGB 15.2 13.6 13.3  HCT 46.3 40.4 39.4  MCV 89.7 87.6 88.9  PLT 115* 105* 99*   Microbiology: Results for orders placed or performed during the hospital encounter of 05/01/22  Culture, blood (Routine x 2)     Status: None   Collection Time: 05/01/22  1:01 PM   Specimen: BLOOD  Result Value Ref Range Status   Specimen Description BLOOD LEFT ANTECUBITAL  Final   Special Requests   Final    BOTTLES DRAWN AEROBIC AND ANAEROBIC Blood Culture results may not be optimal due to an inadequate volume of blood received in culture bottles   Culture   Final    NO GROWTH 5 DAYS Performed at [facility], [address] [place], [place] [address]    Report Status 05/06/2022 FINAL  Final  Culture, blood (Routine x 2)     Status: None   Collection Time: 05/01/22  1:01 PM   Specimen: BLOOD  Result Value Ref Range Status   Specimen Description BLOOD BLOOD LEFT HAND  Final   Special Requests   Final    BOTTLES DRAWN AEROBIC AND ANAEROBIC Blood Culture results may not be optimal due to an inadequate volume of blood received in culture bottles   Culture   Final    NO GROWTH 5 DAYS Performed at [facility], [address], [place], [place] [address]    Report Status 05/06/2022 FINAL  Final  Aerobic/Anaerobic Culture w Gram Stain (surgical/deep wound)     Status: None (Preliminary result)   Collection Time: 05/01/22  5:26 PM   Specimen: PATH Bone resection; Tissue  Result Value Ref Range Status   Specimen Description   Final    FOOT Performed at [facility], [address], [place], [place] [address]    Special Requests    Final    RT 5TH PLETATORSAL CULTURE Performed at [facility], [address] [place], [place] [address]    Gram Stain   Final    NO WBC SEEN NO ORGANISMS SEEN Performed at [facility], [address] [place], [place] [address]    Culture   Final    RARE ENTEROCOCCUS FAECALIS RARE MORGANELLA MORGANII NO ANAEROBES ISOLATED; CULTURE IN PROGRESS FOR 5 DAYS    Report Status PENDING  Incomplete   Organism ID, Bacteria ENTEROCOCCUS FAECALIS  Final   Organism ID, Bacteria MORGANELLA MORGANII  Final      Susceptibility   Enterococcus faecalis - MIC*    AMPICILLIN <=2 SENSITIVE Sensitive     VANCOMYCIN 1 SENSITIVE Sensitive     GENTAMICIN SYNERGY SENSITIVE Sensitive     * RARE ENTEROCOCCUS FAECALIS   Morganella morganii - MIC*    AMPICILLIN >=32 RESISTANT Resistant     CEFAZOLIN >=64 RESISTANT Resistant     CEFTAZIDIME <=1 SENSITIVE Sensitive     CIPROFLOXACIN <=0.25 SENSITIVE Sensitive     GENTAMICIN <=1 SENSITIVE Sensitive     IMIPENEM 2 SENSITIVE Sensitive     TRIMETH/SULFA <=20 SENSITIVE Sensitive     AMPICILLIN/SULBACTAM >=32 RESISTANT Resistant     PIP/TAZO <=4 SENSITIVE Sensitive     * RARE MORGANELLA MORGANII   Time coordinating discharge: Over 30 minutes  Leeroy Bock, MD  [facility] 05/05/2022, 7:20 PM

## 2022-05-03 NOTE — Plan of Care (Signed)

## 2022-05-03 NOTE — Discharge Instructions (Signed)
Please take antibiotics as instructed and follow up with podiatry as scheduled Continue non-weight bearing as instructed by podiatry

## 2022-05-03 NOTE — Progress Notes (Signed)
Patient discharged to home, wheeled out of unit, accompanied by family with all belongings. A+Ox4. VSS. Pain managed. Medications and discharge instructions reviewed. All questions answered. PIV x 1 removed, no bleeding, intact.  Patient agreed to follow up with appointments as listed on AVS. Patient satisfied with overall care at [facility].

## 2022-05-03 NOTE — Progress Notes (Signed)
2 Days Post-Op   Subjective/Chief Complaint: Patient seen.  Still no complaints.  Denies any pain.   Objective: Vital signs in last 24 hours: Temp:  [97.7 F (36.5 C)-98.3 F (36.8 C)] 98 F (36.7 C) (10/28 0730) Pulse Rate:  [91-108] 108 (10/28 0730) Resp:  [16-17] 16 (10/28 0730) BP: (106-138)/(80-100) 138/100 (10/28 0730) SpO2:  [97 %-98 %] 97 % (10/28 0730) Last BM Date : 05/02/22  Intake/Output from previous day: 10/27 0701 - 10/28 0700 In: 240 [P.O.:240] Out: -  Intake/Output this shift: No intake/output data recorded.  Some strikethrough noted on the bandaging today.  Still some moderate bleeding.  Upon removal only mild erythema and edema.  Incision is well coapted with skin edges viable.  No purulence.  Lab Results:  Recent Labs    05/01/22 1300 05/02/22 0543  WBC 12.7* 9.7  HGB 13.6 13.3  HCT 40.4 39.4  PLT 105* 99*   BMET Recent Labs    05/01/22 1300 05/02/22 0543  NA 137 139  K 3.6 4.9  CL 104 104  CO2 18* 25  GLUCOSE 136* 179*  BUN 7 10  CREATININE 0.58* 0.79  CALCIUM 9.4 8.6*   PT/INR Recent Labs    05/01/22 1301  LABPROT 13.4  INR 1.0   ABG No results for input(s): "PHART", "HCO3" in the last 72 hours.  Invalid input(s): "PCO2", "PO2"  Studies/Results: DG Chest 2 View  Result Date: 05/01/2022 CLINICAL DATA:  Sepsis. EXAM: CHEST - 2 VIEW COMPARISON:  01/28/2022 FINDINGS: The lungs are clear without focal pneumonia, edema, pneumothorax or pleural effusion. The cardiopericardial silhouette is within normal limits for size. The visualized bony structures of the thorax are unremarkable. IMPRESSION: No active cardiopulmonary disease. Electronically Signed   By: Misty Stanley M.D.   On: 05/01/2022 14:19    Anti-infectives: Anti-infectives (From admission, onward)    Start     Dose/Rate Route Frequency Ordered Stop   05/02/22 0330  vancomycin (VANCOREADY) IVPB 2000 mg/400 mL        2,000 mg 200 mL/hr over 120 Minutes Intravenous  Once  05/02/22 0242 05/02/22 0518   05/01/22 1315  cefTRIAXone (ROCEPHIN) 2 g in sodium chloride 0.9 % 100 mL IVPB        2 g 200 mL/hr over 30 Minutes Intravenous Every 24 hours 05/01/22 1301 05/08/22 1314   05/01/22 1315  metroNIDAZOLE (FLAGYL) IVPB 500 mg        500 mg 100 mL/hr over 60 Minutes Intravenous Every 12 hours 05/01/22 1301 05/08/22 1314   05/01/22 1315  vancomycin (VANCOREADY) IVPB 2000 mg/400 mL  Status:  Discontinued        2,000 mg 200 mL/hr over 120 Minutes Intravenous  Once 05/01/22 1306 05/02/22 0242       Assessment/Plan: s/p Procedure(s): IRRIGATION AND DEBRIDEMENT FOOT;SOFT TISSUE AND BONE (Right) Assessment: Stable status post debridement right foot.  Plan: Betadine gauze and bulky sterile bandage reapplied to the right foot.  Discussed with the patient that at this point I would recommend nonweightbearing on the right foot.  States that he does have crutches at home.  Keep the bandage clean dry and intact until seen in the patient.  We will plan for outpatient evaluation on Tuesday.  Patient should be stable for discharge on oral antibiotics.  LOS: 2 days    Daniel Ritter 05/03/2022

## 2022-05-06 LAB — CULTURE, BLOOD (ROUTINE X 2)
Culture: NO GROWTH
Culture: NO GROWTH

## 2022-05-06 LAB — SURGICAL PATHOLOGY

## 2022-05-07 LAB — AEROBIC/ANAEROBIC CULTURE W GRAM STAIN (SURGICAL/DEEP WOUND): Gram Stain: NONE SEEN

## 2022-05-27 IMAGING — US US ABDOMEN COMPLETE
1 series · 14 of 25 positions shown · non-contrast
Comparison: None.

CLINICAL DATA: Thrombocytopenia

EXAM:
ABDOMEN ULTRASOUND COMPLETE

[Series 1: us abdomen complete · 0.30mm/px · 14 of 85 slices shown]
[im 1/85]
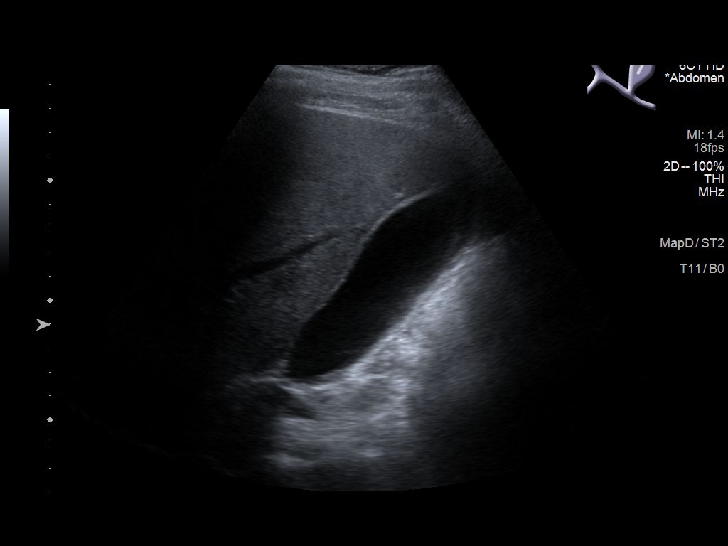
[im 8/85]
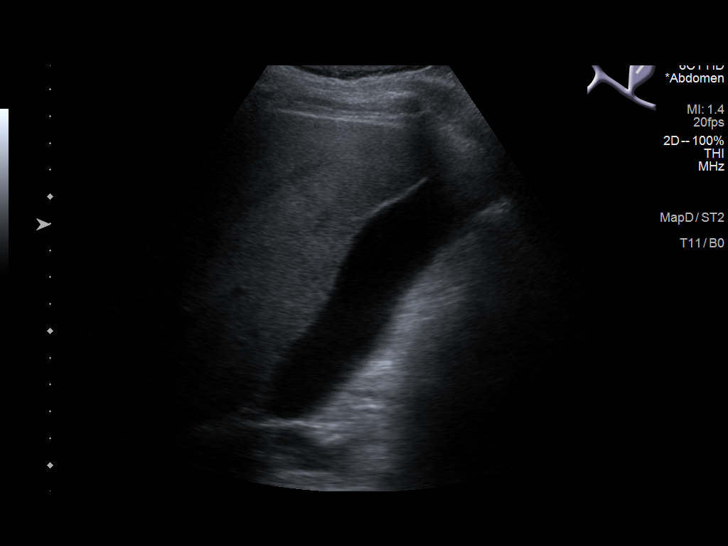
[im 15/85]
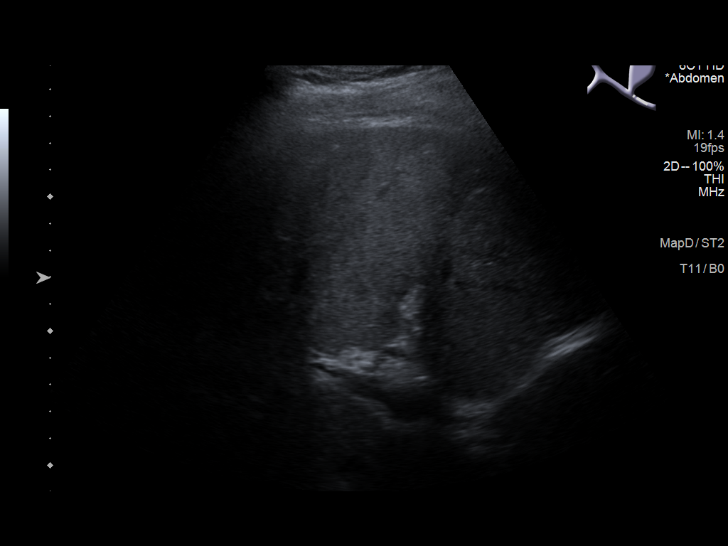
[im 22/85]
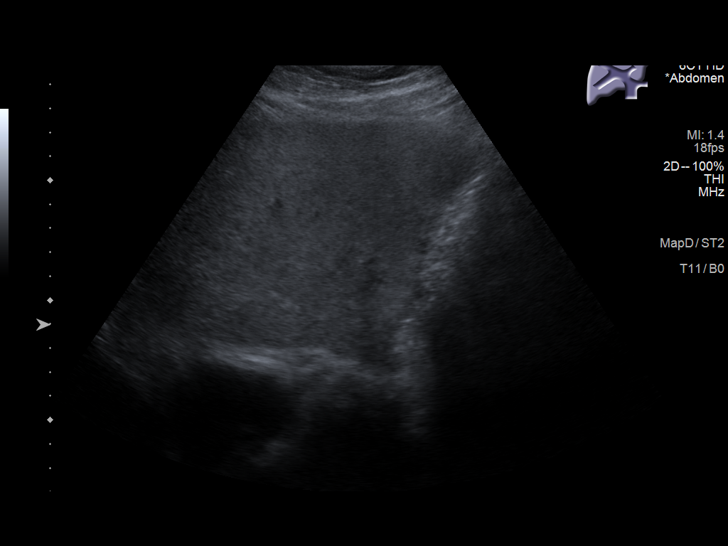
[im 29/85]
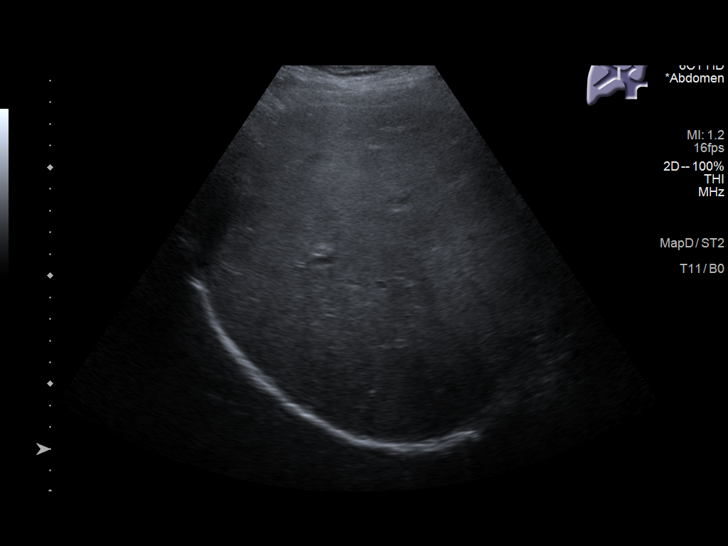
[im 32/85]
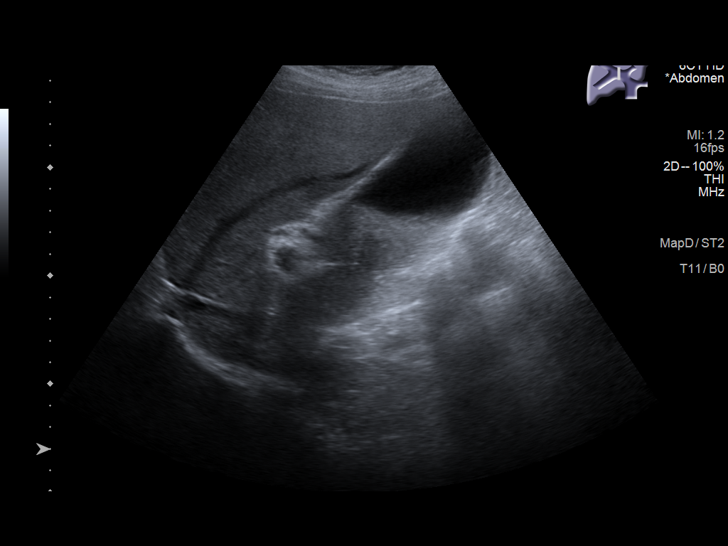
[im 39/85]
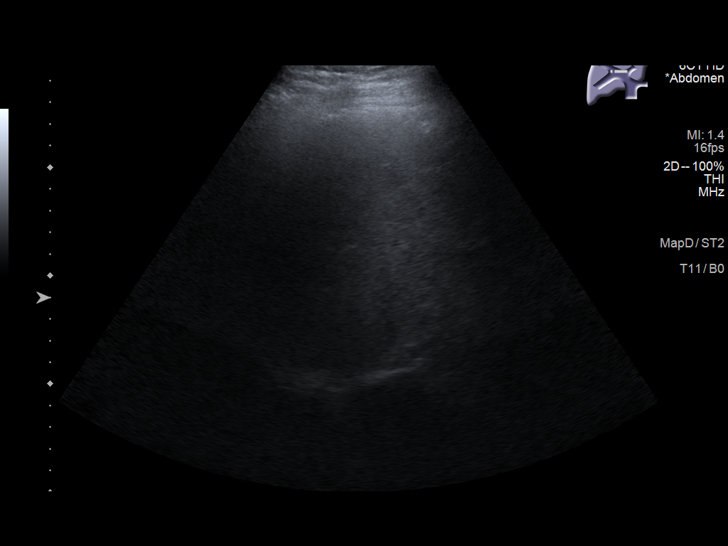
[im 46/85]
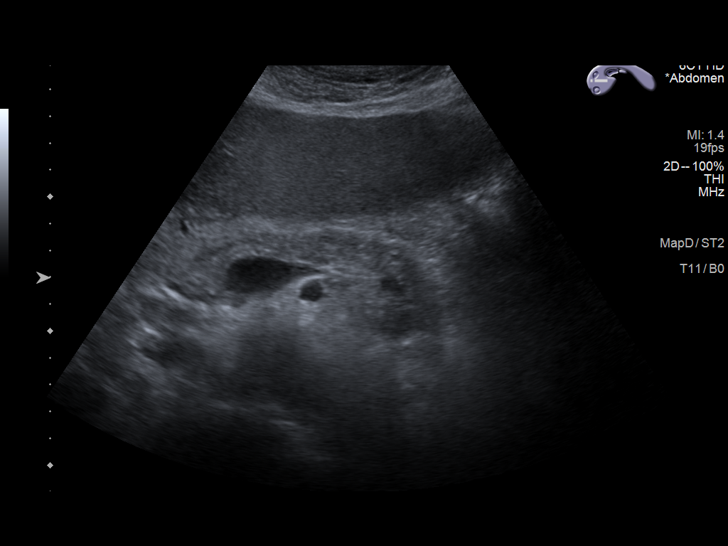
[im 53/85]
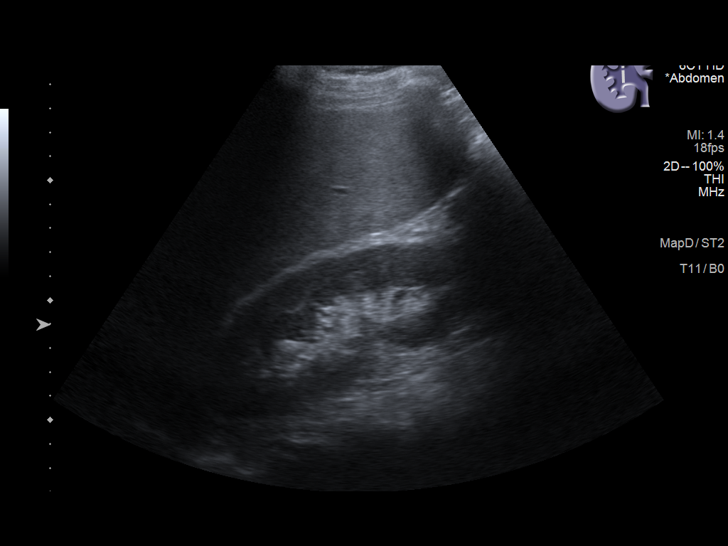
[im 57/85]
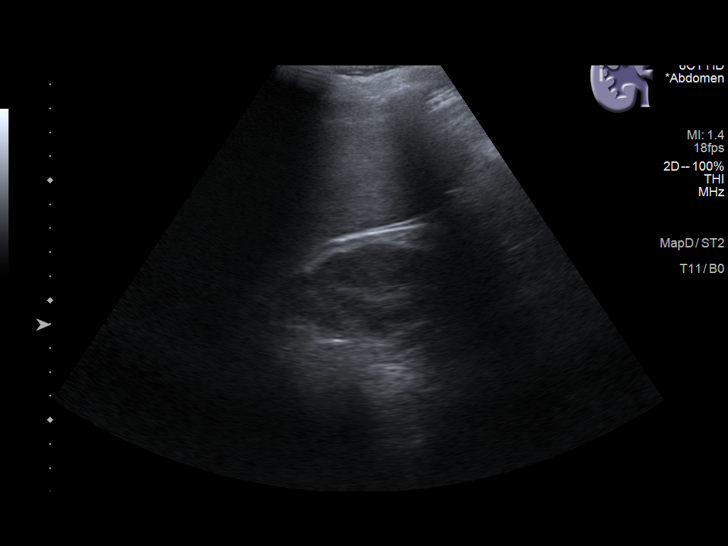
[im 64/85]
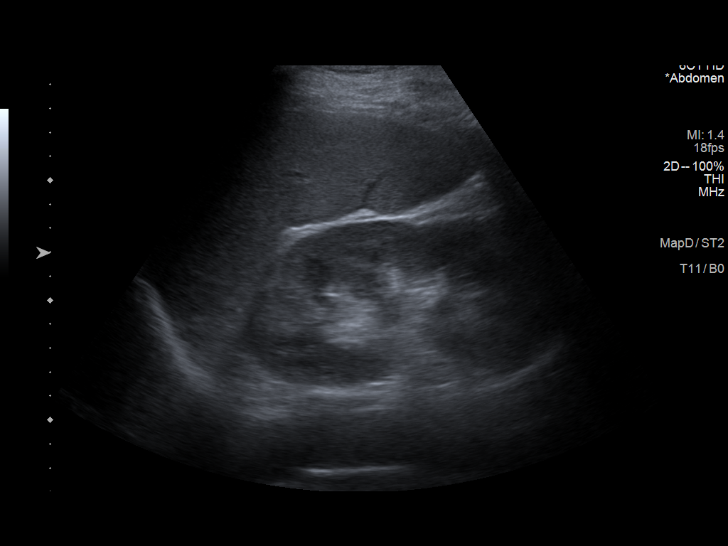
[im 71/85]
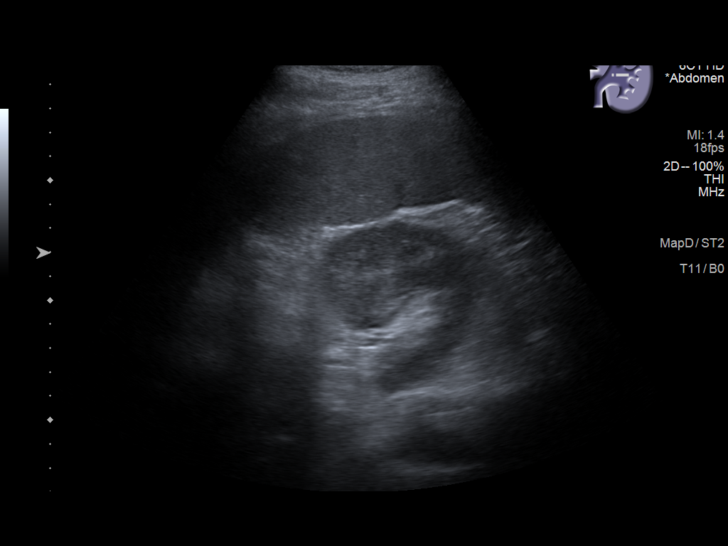
[im 78/85]
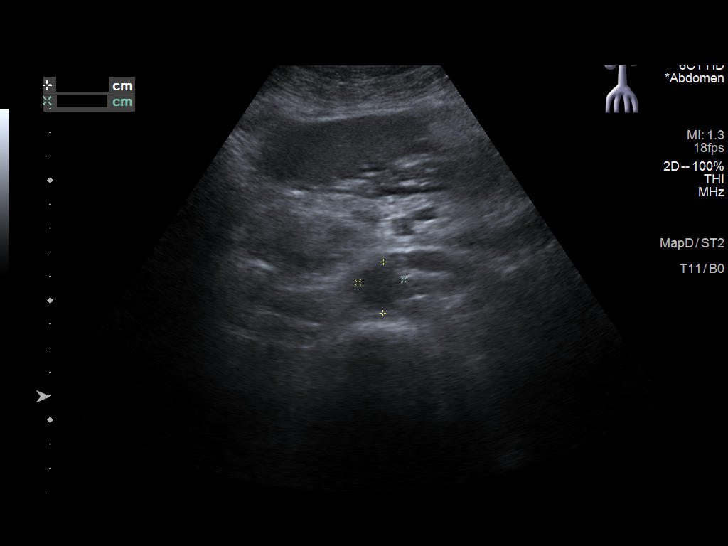
[im 85/85]
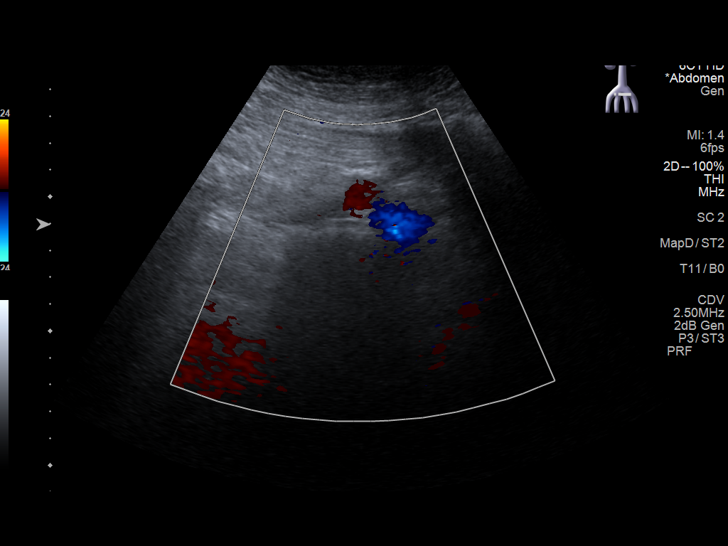

[14 of 25 positions shown; findings below may reference images not displayed]

FINDINGS: Gallbladder: No gallstones or wall thickening visualized. No
sonographic Murphy sign noted by sonographer.

Common bile duct: Diameter: 0.5 cm, within normal limits

Liver: No focal lesion identified. Diffusely increased liver
parenchymal echogenicity. Portal vein is patent on color Doppler
imaging with normal direction of blood flow towards the liver.

IVC: No abnormality visualized.

Pancreas: Visualized portion unremarkable.

Spleen: Enlarged measuring 14 cm in length with a volume of 660 cc.

Right Kidney: Length: 12.9 cm. Echogenicity within normal limits. No
mass or hydronephrosis visualized.

Left Kidney: Length: 12.7 cm. Echogenicity within normal limits. No
mass or hydronephrosis visualized.

Abdominal aorta: Proximal aorta measures 3.0 cm in diameter.

Other findings: None.
IMPRESSION: 1.  Splenomegaly.

2.  Ectasia of the proximal abdominal aorta.

## 2023-01-12 ENCOUNTER — Inpatient Hospital Stay
Admission: EM | Admit: 2023-01-12 | Discharge: 2023-01-15 | DRG: 638 | Disposition: A | Discharge status: Home / Self Care | Payer: Medicaid Other | Attending: Internal Medicine | Admitting: Internal Medicine

## 2023-01-12 ENCOUNTER — Encounter: Payer: Self-pay | Admitting: Emergency Medicine

## 2023-01-12 ENCOUNTER — Emergency Department: Payer: Medicaid Other

## 2023-01-12 ENCOUNTER — Other Ambulatory Visit: Payer: Self-pay

## 2023-01-12 DIAGNOSIS — E1169 Type 2 diabetes mellitus with other specified complication: Secondary | ICD-10-CM | POA: Diagnosis not present

## 2023-01-12 DIAGNOSIS — F411 Generalized anxiety disorder: Secondary | ICD-10-CM | POA: Diagnosis present

## 2023-01-12 DIAGNOSIS — Z8249 Family history of ischemic heart disease and other diseases of the circulatory system: Secondary | ICD-10-CM

## 2023-01-12 DIAGNOSIS — E1151 Type 2 diabetes mellitus with diabetic peripheral angiopathy without gangrene: Secondary | ICD-10-CM | POA: Diagnosis present

## 2023-01-12 DIAGNOSIS — L2489 Irritant contact dermatitis due to other agents: Secondary | ICD-10-CM

## 2023-01-12 DIAGNOSIS — L258 Unspecified contact dermatitis due to other agents: Secondary | ICD-10-CM | POA: Diagnosis present

## 2023-01-12 DIAGNOSIS — E119 Type 2 diabetes mellitus without complications: Secondary | ICD-10-CM

## 2023-01-12 DIAGNOSIS — Z806 Family history of leukemia: Secondary | ICD-10-CM

## 2023-01-12 DIAGNOSIS — S81801A Unspecified open wound, right lower leg, initial encounter: Secondary | ICD-10-CM | POA: Diagnosis not present

## 2023-01-12 DIAGNOSIS — E11628 Type 2 diabetes mellitus with other skin complications: Principal | ICD-10-CM | POA: Diagnosis present

## 2023-01-12 DIAGNOSIS — G40909 Epilepsy, unspecified, not intractable, without status epilepticus: Secondary | ICD-10-CM | POA: Diagnosis not present

## 2023-01-12 DIAGNOSIS — I10 Essential (primary) hypertension: Secondary | ICD-10-CM | POA: Diagnosis present

## 2023-01-12 DIAGNOSIS — L03115 Cellulitis of right lower limb: Secondary | ICD-10-CM

## 2023-01-12 DIAGNOSIS — Z87891 Personal history of nicotine dependence: Secondary | ICD-10-CM

## 2023-01-12 DIAGNOSIS — Z89421 Acquired absence of other right toe(s): Secondary | ICD-10-CM

## 2023-01-12 DIAGNOSIS — Z801 Family history of malignant neoplasm of trachea, bronchus and lung: Secondary | ICD-10-CM

## 2023-01-12 DIAGNOSIS — R739 Hyperglycemia, unspecified: Secondary | ICD-10-CM

## 2023-01-12 DIAGNOSIS — F419 Anxiety disorder, unspecified: Secondary | ICD-10-CM | POA: Diagnosis present

## 2023-01-12 DIAGNOSIS — Z818 Family history of other mental and behavioral disorders: Secondary | ICD-10-CM

## 2023-01-12 DIAGNOSIS — I739 Peripheral vascular disease, unspecified: Secondary | ICD-10-CM | POA: Insufficient documentation

## 2023-01-12 DIAGNOSIS — D692 Other nonthrombocytopenic purpura: Secondary | ICD-10-CM | POA: Diagnosis present

## 2023-01-12 DIAGNOSIS — Z7984 Long term (current) use of oral hypoglycemic drugs: Secondary | ICD-10-CM

## 2023-01-12 DIAGNOSIS — Z79899 Other long term (current) drug therapy: Secondary | ICD-10-CM

## 2023-01-12 DIAGNOSIS — Z8673 Personal history of transient ischemic attack (TIA), and cerebral infarction without residual deficits: Secondary | ICD-10-CM

## 2023-01-12 DIAGNOSIS — E1142 Type 2 diabetes mellitus with diabetic polyneuropathy: Secondary | ICD-10-CM | POA: Diagnosis present

## 2023-01-12 DIAGNOSIS — I776 Arteritis, unspecified: Secondary | ICD-10-CM

## 2023-01-12 DIAGNOSIS — E1165 Type 2 diabetes mellitus with hyperglycemia: Secondary | ICD-10-CM | POA: Diagnosis present

## 2023-01-12 DIAGNOSIS — K219 Gastro-esophageal reflux disease without esophagitis: Secondary | ICD-10-CM | POA: Diagnosis present

## 2023-01-12 DIAGNOSIS — R233 Spontaneous ecchymoses: Secondary | ICD-10-CM

## 2023-01-12 DIAGNOSIS — R569 Unspecified convulsions: Secondary | ICD-10-CM

## 2023-01-12 DIAGNOSIS — F32A Depression, unspecified: Secondary | ICD-10-CM | POA: Diagnosis present

## 2023-01-12 LAB — LACTIC ACID, PLASMA
Lactic Acid, Venous: 3.2 mmol/L (ref 0.5–1.9)
Lactic Acid, Venous: 2.7 mmol/L (ref 0.5–1.9)
Lactic Acid, Venous: 2.2 mmol/L (ref 0.5–1.9)

## 2023-01-12 LAB — CBC WITH DIFFERENTIAL/PLATELET
Abs Immature Granulocytes: 0.2 10*3/uL — ABNORMAL HIGH (ref 0.00–0.07)
Basophils Absolute: 0.2 10*3/uL — ABNORMAL HIGH (ref 0.0–0.1)
Basophils Relative: 1 %
Eosinophils Absolute: 0.2 10*3/uL (ref 0.0–0.5)
Eosinophils Relative: 2 %
HCT: 45.3 % (ref 39.0–52.0)
Hemoglobin: 15.8 g/dL (ref 13.0–17.0)
Immature Granulocytes: 1 %
Lymphocytes Relative: 19 %
Lymphs Abs: 2.8 10*3/uL (ref 0.7–4.0)
MCH: 32 pg (ref 26.0–34.0)
MCHC: 34.9 g/dL (ref 30.0–36.0)
MCV: 91.7 fL (ref 80.0–100.0)
Monocytes Absolute: 1.3 10*3/uL — ABNORMAL HIGH (ref 0.1–1.0)
Monocytes Relative: 9 %
Neutro Abs: 9.8 10*3/uL — ABNORMAL HIGH (ref 1.7–7.7)
Neutrophils Relative %: 68 %
Platelets: 125 10*3/uL — ABNORMAL LOW (ref 150–400)
RBC: 4.94 MIL/uL (ref 4.22–5.81)
RDW: 12.7 % (ref 11.5–15.5)
WBC: 14.4 10*3/uL — ABNORMAL HIGH (ref 4.0–10.5)
nRBC: 0 % (ref 0.0–0.2)

## 2023-01-12 LAB — BLOOD GAS, VENOUS
Acid-base deficit: 2.5 mmol/L — ABNORMAL HIGH (ref 0.0–2.0)
Bicarbonate: 22.5 mmol/L (ref 20.0–28.0)
O2 Saturation: 70.4 %
Patient temperature: 37
pCO2, Ven: 39 mmHg — ABNORMAL LOW (ref 44–60)
pH, Ven: 7.37 (ref 7.25–7.43)
pO2, Ven: 43 mmHg (ref 32–45)

## 2023-01-12 LAB — BETA-HYDROXYBUTYRIC ACID: Beta-Hydroxybutyric Acid: 0.84 mmol/L — ABNORMAL HIGH (ref 0.05–0.27)

## 2023-01-12 LAB — BASIC METABOLIC PANEL
Anion gap: 18 — ABNORMAL HIGH (ref 5–15)
BUN: 23 mg/dL — ABNORMAL HIGH (ref 6–20)
CO2: 17 mmol/L — ABNORMAL LOW (ref 22–32)
Calcium: 9.5 mg/dL (ref 8.9–10.3)
Chloride: 96 mmol/L — ABNORMAL LOW (ref 98–111)
Creatinine, Ser: 1.36 mg/dL — ABNORMAL HIGH (ref 0.61–1.24)
GFR, Estimated: >60 mL/min (ref >60)
Glucose, Bld: 295 mg/dL — ABNORMAL HIGH (ref 70–99)
Potassium: 3.2 mmol/L — ABNORMAL LOW (ref 3.5–5.1)
Sodium: 131 mmol/L — ABNORMAL LOW (ref 135–145)

## 2023-01-12 LAB — CBG MONITORING, ED
Glucose-Capillary: 183 mg/dL — ABNORMAL HIGH (ref 70–99)
Glucose-Capillary: 132 mg/dL — ABNORMAL HIGH (ref 70–99)

## 2023-01-12 LAB — C-REACTIVE PROTEIN: CRP: 1.2 mg/dL — ABNORMAL HIGH (ref <1.0)

## 2023-01-12 LAB — PREALBUMIN: Prealbumin: 25 mg/dL (ref 18–38)

## 2023-01-12 LAB — SEDIMENTATION RATE: Sed Rate: 26 mm/hr — ABNORMAL HIGH (ref 0–15)

## 2023-01-12 MED ORDER — SODIUM CHLORIDE 0.9 % IV SOLN: INTRAVENOUS | Status: DC

## 2023-01-12 MED ORDER — ONDANSETRON HCL 4 MG/2ML IJ SOLN: 4.0000 mg | Freq: Four times a day (QID) | INTRAMUSCULAR | Status: DC | PRN

## 2023-01-12 MED ORDER — SODIUM CHLORIDE 0.9 % IV BOLUS
1000.0000 mL | Freq: Once | INTRAVENOUS | Status: AC
  Administered 2023-01-12: 1000 mL via INTRAVENOUS

## 2023-01-12 MED ORDER — LISINOPRIL-HYDROCHLOROTHIAZIDE 20-12.5 MG PO TABS: 1.0000 | ORAL_TABLET | Freq: Every day | ORAL | Status: DC

## 2023-01-12 MED ORDER — ACETAMINOPHEN 325 MG PO TABS
650.0000 mg | ORAL_TABLET | Freq: Four times a day (QID) | ORAL | Status: DC | PRN
  Administered 2023-01-12: 650 mg via ORAL
  Filled 2023-01-12: qty 2

## 2023-01-12 MED ORDER — ACETAMINOPHEN 650 MG RE SUPP: 650.0000 mg | Freq: Four times a day (QID) | RECTAL | Status: DC | PRN

## 2023-01-12 MED ORDER — ACETAMINOPHEN 325 MG PO TABS: 650.0000 mg | ORAL_TABLET | Freq: Four times a day (QID) | ORAL | Status: DC | PRN

## 2023-01-12 MED ORDER — INSULIN ASPART 100 UNIT/ML IJ SOLN
3.0000 [IU] | Freq: Three times a day (TID) | INTRAMUSCULAR | Status: DC
  Administered 2023-01-12 – 2023-01-15 (×8): 3 [IU] via SUBCUTANEOUS
  Filled 2023-01-12 (×9): qty 1

## 2023-01-12 MED ORDER — LEVETIRACETAM 500 MG PO TABS
500.0000 mg | ORAL_TABLET | Freq: Two times a day (BID) | ORAL | Status: DC
  Administered 2023-01-12 – 2023-01-15 (×7): 500 mg via ORAL
  Filled 2023-01-12 (×7): qty 1

## 2023-01-12 MED ORDER — HYDROCODONE-ACETAMINOPHEN 5-325 MG PO TABS
1.0000 | ORAL_TABLET | ORAL | Status: DC | PRN
  Administered 2023-01-12 – 2023-01-15 (×8): 2 via ORAL
  Filled 2023-01-12 (×8): qty 2

## 2023-01-12 MED ORDER — DULOXETINE HCL 30 MG PO CPEP
30.0000 mg | ORAL_CAPSULE | Freq: Every day | ORAL | Status: DC
  Administered 2023-01-13 – 2023-01-15 (×3): 30 mg via ORAL
  Filled 2023-01-12 (×3): qty 1

## 2023-01-12 MED ORDER — PANTOPRAZOLE SODIUM 40 MG PO TBEC
40.0000 mg | DELAYED_RELEASE_TABLET | Freq: Every day | ORAL | Status: DC
  Administered 2023-01-13 – 2023-01-15 (×3): 40 mg via ORAL
  Filled 2023-01-12 (×3): qty 1

## 2023-01-12 MED ORDER — SODIUM CHLORIDE 0.9 % IV SOLN
2.0000 g | INTRAVENOUS | Status: DC
  Administered 2023-01-12 – 2023-01-13 (×2): 2 g via INTRAVENOUS
  Filled 2023-01-12 (×2): qty 20

## 2023-01-12 MED ORDER — VANCOMYCIN HCL 1250 MG/250ML IV SOLN
1250.0000 mg | Freq: Two times a day (BID) | INTRAVENOUS | Status: DC
  Administered 2023-01-13: 1250 mg via INTRAVENOUS
  Filled 2023-01-12 (×2): qty 250

## 2023-01-12 MED ORDER — METOPROLOL SUCCINATE ER 50 MG PO TB24
100.0000 mg | ORAL_TABLET | Freq: Every day | ORAL | Status: DC
  Administered 2023-01-13 – 2023-01-15 (×3): 100 mg via ORAL
  Filled 2023-01-12 (×3): qty 2

## 2023-01-12 MED ORDER — ALPRAZOLAM 0.25 MG PO TABS
0.2500 mg | ORAL_TABLET | Freq: Two times a day (BID) | ORAL | Status: DC
  Administered 2023-01-12 – 2023-01-15 (×6): 0.25 mg via ORAL
  Filled 2023-01-12 (×6): qty 1

## 2023-01-12 MED ORDER — VANCOMYCIN HCL 2000 MG/400ML IV SOLN
2000.0000 mg | Freq: Once | INTRAVENOUS | Status: AC
  Administered 2023-01-12: 2000 mg via INTRAVENOUS
  Filled 2023-01-12: qty 400

## 2023-01-12 MED ORDER — AMLODIPINE BESYLATE 5 MG PO TABS
5.0000 mg | ORAL_TABLET | Freq: Every day | ORAL | Status: DC
  Administered 2023-01-13 – 2023-01-15 (×3): 5 mg via ORAL
  Filled 2023-01-12 (×3): qty 1

## 2023-01-12 MED ORDER — METRONIDAZOLE 500 MG/100ML IV SOLN
500.0000 mg | Freq: Two times a day (BID) | INTRAVENOUS | Status: DC
  Administered 2023-01-12 – 2023-01-14 (×4): 500 mg via INTRAVENOUS
  Filled 2023-01-12 (×4): qty 100

## 2023-01-12 MED ORDER — VANCOMYCIN HCL 500 MG/100ML IV SOLN
500.0000 mg | Freq: Once | INTRAVENOUS | Status: AC
  Administered 2023-01-12: 500 mg via INTRAVENOUS
  Filled 2023-01-12: qty 100

## 2023-01-12 MED ORDER — INSULIN ASPART 100 UNIT/ML IJ SOLN
0.0000 [IU] | Freq: Three times a day (TID) | INTRAMUSCULAR | Status: DC
  Administered 2023-01-12 – 2023-01-13 (×2): 3 [IU] via SUBCUTANEOUS
  Administered 2023-01-13: 5 [IU] via SUBCUTANEOUS
  Administered 2023-01-13: 2 [IU] via SUBCUTANEOUS
  Administered 2023-01-14 (×2): 3 [IU] via SUBCUTANEOUS
  Administered 2023-01-14: 2 [IU] via SUBCUTANEOUS
  Administered 2023-01-15: 3 [IU] via SUBCUTANEOUS
  Filled 2023-01-12 (×8): qty 1

## 2023-01-12 MED ORDER — ONDANSETRON HCL 4 MG PO TABS: 4.0000 mg | ORAL_TABLET | Freq: Four times a day (QID) | ORAL | Status: DC | PRN

## 2023-01-12 MED ORDER — HYDROCHLOROTHIAZIDE 12.5 MG PO TABS
12.5000 mg | ORAL_TABLET | Freq: Every day | ORAL | Status: DC
  Administered 2023-01-13 – 2023-01-15 (×3): 12.5 mg via ORAL
  Filled 2023-01-12 (×3): qty 1

## 2023-01-12 MED ORDER — INSULIN ASPART 100 UNIT/ML IJ SOLN: 0.0000 [IU] | Freq: Every day | INTRAMUSCULAR | Status: DC

## 2023-01-12 MED ORDER — PIPERACILLIN-TAZOBACTAM 3.375 G IVPB 30 MIN
3.3750 g | Freq: Once | INTRAVENOUS | Status: AC
  Administered 2023-01-12: 3.375 g via INTRAVENOUS
  Filled 2023-01-12: qty 50

## 2023-01-12 MED ORDER — ENOXAPARIN SODIUM 60 MG/0.6ML IJ SOSY
60.0000 mg | PREFILLED_SYRINGE | INTRAMUSCULAR | Status: DC
  Administered 2023-01-12 – 2023-01-14 (×3): 60 mg via SUBCUTANEOUS
  Filled 2023-01-12 (×3): qty 0.6

## 2023-01-12 MED ORDER — LISINOPRIL 20 MG PO TABS
20.0000 mg | ORAL_TABLET | Freq: Every day | ORAL | Status: DC
  Administered 2023-01-13 – 2023-01-15 (×3): 20 mg via ORAL
  Filled 2023-01-12: qty 1
  Filled 2023-01-12: qty 2
  Filled 2023-01-12: qty 1

## 2023-01-12 NOTE — Consult Note (Signed)
PHARMACY -  BRIEF ANTIBIOTIC NOTE   Pharmacy has received consult(s) for vancomycin from an ED provider.  The patient's profile has been reviewed for ht/wt/allergies/indication/available labs.    One time order(s) placed for  --Vancomycin 2 g IV  Further antibiotics/pharmacy consults should be ordered by admitting physician if indicated.                       Thank you, Tressie Ellis 01/12/2023  12:47 PM

## 2023-01-12 NOTE — Assessment & Plan Note (Signed)
Continue Keppra.

## 2023-01-12 NOTE — Consult Note (Signed)
Pharmacy Antibiotic Note  CARDAE SAPP is a 47 y.o. male admitted on 01/12/2023 with cellulitis.  Pharmacy has been consulted for vancomycin dosing.  Vancomycin 2000 mg IV x 1 given 7/8 @ 1338  Scr 1.36 (0.79 05/02/22)  Plan: Give additional vancomycin 500 mg IV x 1 to complete loading dose of 2500 mg Start vancomycin 1250 mg IV every 12 hours Estimated AUC 455, Cmin 13.7 IBW, Scr 1.36, Vd coefficient 0.72 Vancomycin levels at steady state or as clinically indicated Ceftriaxone 2 grams IV every 24 hours per provider Flagyl 500 mg IV every 12 hours per provider  Height: 6\' 1"  (185.4 cm) Weight: 114.3 kg (252 lb) IBW/kg (Calculated) : 79.9  Temp (24hrs), Avg:98.6 F (37 C), Min:98.6 F (37 C), Max:98.6 F (37 C)  Recent Labs  Lab 01/12/23 1010 01/12/23 1117  WBC 14.4*  --   CREATININE 1.36*  --   LATICACIDVEN  --  3.2*    Estimated Creatinine Clearance: 88 mL/min (A) (by C-G formula based on SCr of 1.36 mg/dL (H)).    No Known Allergies  Antimicrobials this admission: Vancomycin 7/8 >>  ceftriaxone 7/8 >>  Flagyl 7/8 >> Zosyn 7/8 x 1  Dose adjustments this admission: N/A  Microbiology results: 7/8 BCx: pending   Thank you for allowing pharmacy to be a part of this patient's care.  Barrie Folk, PharmD 01/12/2023 1:38 PM

## 2023-01-12 NOTE — ED Triage Notes (Signed)
Pt with chronic diabetic foot issues with partial amputation 10/23 and infection. Pt finished cipro last night and is concerned that he still has infection. Pt sees Dr. Alberteen Spindle.

## 2023-01-12 NOTE — ED Provider Notes (Signed)
[facility] Provider Note    Event Date/Time   First MD Initiated Contact with Patient 01/12/23 1050     (approximate)   History   Foot Pain   HPI  Daniel Ritter is a 47 y.o. 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  He reports that he hit his great toe on something approximate 1 week ago, but is unsure what.  He reports that since that time he has had redness from his forefoot that has been extending proximally and now is to his mid shin.  He has not had any fevers or chills.  He reports that he saw his doctor 1 week ago and they started him on ciprofloxacin.  He reports that the area has continued to worsen despite being on ciprofloxacin.  He has not had any open wounds or drainage.  Patient Active Problem List   Diagnosis Date Noted   Wound of right lower extremity 01/12/2023   Acute osteomyelitis (HCC) 05/01/2022   Lactic acidosis 05/01/2022   Malnutrition of moderate degree 01/30/2022   Sepsis due to cellulitis (HCC) 01/28/2022   Acute osteomyelitis of ankle or foot, right (HCC) 01/28/2022   Type 2 diabetes mellitus with peripheral neuropathy (HCC) 01/28/2022   Anxiety and depression 01/28/2022   Thrombocytopenia (HCC) 05/02/2020   History of alcohol use 05/02/2020   Transaminitis 05/02/2020   Essential hypertension 11/08/2014   Type 2 diabetes mellitus without complication (HCC) 11/08/2014   Anxiety 11/08/2014   Seizures (HCC) 11/08/2014   Uncontrolled hypertension 09/02/2014   Type 2 diabetes mellitus (HCC) 09/02/2014   Noncompliance with medications 09/02/2014   Convulsions/seizures (HCC) 09/02/2014   Alcohol abuse 09/02/2014   Hemorrhage    Cytotoxic brain edema (HCC)    ICH (intracerebral hemorrhage) (HCC) 08/31/2014           Physical Exam   Triage Vital Signs: ED Triage Vitals  Enc Vitals Group     BP 01/12/23 1016 114/76     Pulse Rate 01/12/23 1016 89     Resp 01/12/23 1016 18     Temp 01/12/23 1016 98.6 F (37 C)     Temp Source 01/12/23 1016 Oral     SpO2 01/12/23 1016 98 %     Weight 01/12/23 1007 252 lb (114.3 kg)     Height 01/12/23 1007 6\' 1"  (1.854 m)     Head Circumference --      Peak Flow --      Pain Score 01/12/23 1007 0     Pain Loc --      Pain Edu? --      Excl. in GC? --     Most recent vital signs: Vitals:   01/12/23 1016  BP: 114/76  Pulse: 89  Resp: 18  Temp: 98.6 F (37 C)  SpO2: 98%    Physical Exam Vitals and nursing note reviewed.  Constitutional:      General: Awake and alert. No acute distress.    Appearance: Normal appearance. The patient is obese.  HENT:     Head: Normocephalic and atraumatic.     Mouth: Mucous membranes are moist.  Eyes:     General: PERRL. Normal EOMs        Right eye: No discharge.  Left eye: No discharge.     Conjunctiva/sclera: Conjunctivae normal.  Cardiovascular:     Rate and Rhythm: Normal rate and regular rhythm.     Pulses: Normal pulses.  Pulmonary:     Effort: Pulmonary effort is normal. No respiratory distress.     Breath sounds: Normal breath sounds.  Abdominal:     Abdomen is soft. There is no abdominal tenderness. No rebound or guarding. No distention. Musculoskeletal:        General: No swelling. Normal range of motion.     Cervical back: Normal range of motion and neck supple.  Right lower extremity with erythema and petechiae noted from the forefoot extending to the medial shin.  The area in the medial shin does not blanch.  He has 2+ pedal pulses.  Normal capillary refill.  There is a small superficial abrasion noted to the midfoot at the medial side.  There is no purulence noted.  He is able to range his ankle and toes.  There is no lymphangitis noted.  There is no crepitus noted. Skin:     General: Skin is warm and dry.     Capillary Refill: Capillary refill takes less than 2 seconds.     Findings: No rash.  Neurological:     Mental Status: The patient is awake and alert.          ED Results / Procedures / Treatments   Labs (all labs ordered are listed, but only abnormal results are displayed) Labs Reviewed  BASIC METABOLIC PANEL - Abnormal; Notable for the following components:      Result Value   Sodium 131 (*)    Potassium 3.2 (*)    Chloride 96 (*)    CO2 17 (*)    Glucose, Bld 295 (*)    BUN 23 (*)    Creatinine, Ser 1.36 (*)    Anion gap 18 (*)    All other components within normal limits  CBC WITH DIFFERENTIAL/PLATELET - Abnormal; Notable for the following components:   WBC 14.4 (*)    Platelets 125 (*)    Neutro Abs 9.8 (*)    Monocytes Absolute 1.3 (*)    Basophils Absolute 0.2 (*)    Abs Immature Granulocytes 0.20 (*)    All other components within normal limits  LACTIC ACID, PLASMA - Abnormal; Notable for the following components:   Lactic Acid, Venous 2.7 (*)    All other components within normal limits  LACTIC ACID, PLASMA - Abnormal; Notable for the following components:   Lactic Acid, Venous 3.2 (*)    All other components within normal limits  BLOOD GAS, VENOUS - Abnormal; Notable for the following components:   pCO2, Ven 39 (*)    Acid-base deficit 2.5 (*)    All other components within normal limits  BETA-HYDROXYBUTYRIC ACID - Abnormal; Notable for the following components:   Beta-Hydroxybutyric Acid 0.84 (*)    All other components within normal limits  CULTURE, BLOOD (ROUTINE X 2)  CULTURE, BLOOD (ROUTINE X 2)  LACTIC ACID, PLASMA  LACTIC ACID, PLASMA  HEMOGLOBIN A1C  SEDIMENTATION RATE  C-REACTIVE PROTEIN  PREALBUMIN     EKG     RADIOLOGY I independently reviewed and interpreted imaging and agree with radiologists findings.     PROCEDURES:  Critical Care performed:   Procedures   MEDICATIONS ORDERED IN  ED: Medications  vancomycin (VANCOREADY) IVPB 2000 mg/400 mL (2,000 mg Intravenous New Bag/Given 01/12/23 1338)  levETIRAcetam (KEPPRA) tablet 500 mg (  500 mg Oral Given 01/12/23 1334)  metoprolol succinate (TOPROL-XL) 24 hr tablet 100 mg (has no administration in time range)  pantoprazole (PROTONIX) EC tablet 40 mg (has no administration in time range)  DULoxetine (CYMBALTA) DR capsule 30 mg (has no administration in time range)  ALPRAZolam (XANAX) tablet 0.25 mg (has no administration in time range)  amLODipine (NORVASC) tablet 5 mg (has no administration in time range)  acetaminophen (TYLENOL) tablet 650 mg (has no administration in time range)  enoxaparin (LOVENOX) injection 60 mg (has no administration in time range)  0.9 %  sodium chloride infusion (has no administration in time range)  ondansetron (ZOFRAN) tablet 4 mg (has no administration in time range)    Or  ondansetron (ZOFRAN) injection 4 mg (has no administration in time range)  cefTRIAXone (ROCEPHIN) 2 g in sodium chloride 0.9 % 100 mL IVPB (has no administration in time range)  metroNIDAZOLE (FLAGYL) IVPB 500 mg (has no administration in time range)  insulin aspart (novoLOG) injection 0-15 Units (has no administration in time range)  insulin aspart (novoLOG) injection 0-5 Units (has no administration in time range)  insulin aspart (novoLOG) injection 3 Units (has no administration in time range)  vancomycin (VANCOREADY) IVPB 500 mg/100 mL (has no administration in time range)  vancomycin (VANCOREADY) IVPB 1250 mg/250 mL (has no administration in time range)  lisinopril (ZESTRIL) tablet 20 mg (has no administration in time range)    And  hydrochlorothiazide (HYDRODIURIL) tablet 12.5 mg (has no administration in time range)  piperacillin-tazobactam (ZOSYN) IVPB 3.375 g (0 g Intravenous Stopped 01/12/23 1413)  sodium chloride 0.9 % bolus 1,000 mL (1,000 mLs Intravenous New Bag/Given 01/12/23 1311)     IMPRESSION / MDM / ASSESSMENT  AND PLAN / ED COURSE  I reviewed the triage vital signs and the nursing notes.   Differential diagnosis includes, but is not limited to, cellulitis, osteomyelitis, DKA  I reviewed the patient's chart.  Patient was seen in clinic recently.  He had an admission on 05/01/2022 for osteomyelitis.  He had an MRI demonstrating right metatarsal osteomyelitis and underwent amputation.  He underwent a previous amputation in July 2023.  Patient also saw his internal med provider on 01/06/2023 and started on ciprofloxacin.  Patient is awake and alert, hemodynamically stable and afebrile.  He has marked erythema and petechiae noted to his right lower extremity, and he consented to photos of this to be placed into his chart.  Further workup is indicated.  Labs obtained reveal a leukocytosis to 14.4, elevated lactate to 3.2.  He was found to be hyperglycemic to 295, with a bicarb of 17 and an anion gap of 18.  I pursued further DKA labs including beta hydroxybutyrate which is mildly elevated, though his pH is normal at 7.37.  X-rays obtained reveal possible defect involving the distal portion of the first distal phalanx suggesting osteomyelitis, however he has no redness, swelling or evidence of infection in this area.  X-ray of his ankle was read as normal.  I discussed patient's presentation and findings with Dr. Ether Griffins with podiatry who does not feel that this is consistent with osteomyelitis and does not feel that we need to obtain MRI at this time.  He did recommend admission for antibiotics to see if the erythema is improving or worsening.  Patient is in agreement with this plan.  Patient was accepted by the hospitalist, Dr. Andrena Mews.   Patient's presentation is most consistent with acute presentation with potential threat to life or  bodily function.   Clinical Course as of 01/12/23 1421  Mon Jan 12, 2023  1238 Discussed with Dr. Ether Griffins who does not recommend MRI at this time.  He recommends  antibiotics and seeing if the redness improves overnight.  Recommends admission to the hospitalist [JP]    Clinical Course User Index [JP] Shadia Larose, Herb Grays, PA-C     FINAL CLINICAL IMPRESSION(S) / ED DIAGNOSES   Final diagnoses:  Cellulitis of right lower extremity  Petechiae  Hyperglycemia     Rx / DC Orders   ED Discharge Orders     None        Note:  This document was prepared using Dragon voice recognition software and may include unintentional dictation errors.   Keturah Shavers 01/12/23 1422    Chesley Noon, MD 01/13/23 [phone]

## 2023-01-12 NOTE — Assessment & Plan Note (Signed)
-   Continue Xanax 

## 2023-01-12 NOTE — Assessment & Plan Note (Signed)
Blood sugar in upper 200s without overt acidosis Placed on sliding scale insulins A1c IV fluid hydration Monitor

## 2023-01-12 NOTE — H&P (Signed)
History and Physical    Patient: Daniel Ritter:[id number] DOB: 07-31-1975 DOA: 01/12/2023 DOS: the patient was seen and examined on 01/12/2023 PCP: Preston Fleeting, MD  Patient coming from: Home  Chief Complaint:  Chief Complaint  Patient presents with   Foot Pain   HPI: Daniel Ritter is a 47 y.o. male with medical history significant of osteomyelitis status post fifth ray amputation, type 2 diabetes, peripheral neuropathy, hypertension, anxiety depression, seizure disorder presenting with right lower extremity wound.  Patient reports worsening right lower extremity redness and pain over the past 4 to 5 days.  Baseline history of prior right fifth ray amputation secondary to osteomyelitis.  Patient states that he had significant issues in the past secondary to heavy alcohol and tobacco use.  Patient states he quit smoking and drinking multiple years ago.  States has been trying to take better care of himself.  Tries to keep his blood sugars in 100s.  Patient states he accidentally struck his foot on the side of furniture.  Has had worsening pain since then.  No chest pain or shortness of breath.  No nausea or vomiting.  No fevers or chills.  Redness is worsened over the course of the past few days. Presented to the ER afebrile, hemodynamically stable.  White count 14, hemoglobin 15.8, platelets 125, VBG within normal limits, lactate 3.2, creatinine 1.36, glucose 295.  BHB 0.84.  Right foot plain films with defect seen involving distal portion of the first distal phalanx concerning for possible osteomyelitis. Review of Systems: As mentioned in the history of present illness. All other systems reviewed and are negative. Past Medical History:  Diagnosis Date   Allergy    seasonal   Anxiety    Diabetes mellitus without complication (HCC)    GERD (gastroesophageal reflux disease)    H/O ETOH abuse    History of rectal abscess    Hypertension 2007   Neuropathy    Pancreatitis     history of, pt states due to alcohol   Seizures (HCC) 08/2014   " two seizures in the hospital"   Stroke [facility]) 08/31/2013   Thrombocytopenia [facility])    Past Surgical History:  Procedure Laterality Date   AMPUTATION TOE Right 01/30/2022   Procedure: PARTIAL 5TH RAY AMUPTATION RIGHT FOOT;  Surgeon: Gwyneth Revels, DPM;  Location: [facility] ORS;  Service: Podiatry;  Laterality: Right;   ANTERIOR CRUCIATE LIGAMENT REPAIR Left 1992, 1993   COLONOSCOPY N/A 09/04/2021   Procedure: COLONOSCOPY;  Surgeon: Toledo, Boykin Nearing, MD;  Location: [facility] ENDOSCOPY;  Service: Gastroenterology;  Laterality: N/A;  DM   INCISION AND DRAINAGE PERIRECTAL ABSCESS N/A 12/14/2015   Procedure: IRRIGATION AND DEBRIDEMENT PERIRECTAL ABSCESS;  Surgeon: Kieth Brightly, MD;  Location: [facility] ORS;  Service: General;  Laterality: N/A;   IRRIGATION AND DEBRIDEMENT FOOT Bilateral 01/30/2022   Procedure: IRRIGATION AND DEBRIDEMENT TENDON ULCER LEFT FOOT,  EXCISION OF 2ND TOE JOINT LEFT FOOT, IRRIGATION AND DEBRIDEMENT SUBCUTANEOUS ULCER RIGHT FOOT;  Surgeon: Gwyneth Revels, DPM;  Location: [facility] ORS;  Service: Podiatry;  Laterality: Bilateral;   IRRIGATION AND DEBRIDEMENT FOOT Right 05/01/2022   Procedure: IRRIGATION AND DEBRIDEMENT FOOT;SOFT TISSUE AND BONE;  Surgeon: Linus Galas, DPM;  Location: [facility] ORS;  Service: Podiatry;  Laterality: Right;   LOWER EXTREMITY ANGIOGRAPHY Right 02/04/2022   Procedure: Lower Extremity Angiography;  Surgeon: Renford Dills, MD;  Location: [facility] INVASIVE CV LAB;  Service: Cardiovascular;  Laterality: Right;   Social History:  reports that he quit smoking  about 2 years ago. His smoking use included cigarettes. He has a 4.50 pack-year smoking history. He quit smokeless tobacco use about 23 years ago.  His smokeless tobacco use included snuff. He reports current alcohol use. He reports that he does not currently use drugs after having used the following drugs: Marijuana.  No Known Allergies  Family History   Problem Relation Age of Onset   Hypertension Mother    Anxiety disorder Mother    Hypertension Father    Hypertension Brother    Leukemia Maternal Grandfather    Lung cancer Paternal Grandfather     Prior to Admission medications   Medication Sig Start Date End Date Taking? Authorizing Provider  chlorhexidine (PERIDEX) 0.12 % solution Use as directed 15 mLs in the mouth or throat 2 (two) times daily. 10/22/22  Yes [provider]  ciprofloxacin (CIPRO) 500 MG tablet Take 1 tablet by mouth 2 (two) times daily. 01/06/23 01/13/23 Yes [provider]  lisinopril-hydrochlorothiazide (ZESTORETIC) 20-25 MG tablet Take 1 tablet by mouth daily. 10/31/22  Yes [provider]  metFORMIN (GLUCOPHAGE) 500 MG tablet Take 500 mg by mouth 2 (two) times daily. 12/08/22  Yes [provider]  acetaminophen (TYLENOL) 325 MG tablet Take 2 tablets (650 mg total) by mouth every 6 (six) hours as needed for mild pain (or Fever >/= 101). 02/05/22   Lurene Shadow, MD  ALPRAZolam Prudy Feeler) 0.25 MG tablet Take 0.25 mg by mouth 2 (two) times daily.    [provider]  amLODipine (NORVASC) 10 MG tablet Take 10 mg by mouth daily as needed. 09/02/21   [provider]  DULoxetine (CYMBALTA) 30 MG capsule Take 30 mg by mouth daily. 11/08/21   [provider]  JANUVIA 100 MG tablet Take 100 mg by mouth daily. 04/30/20   [provider]  levETIRAcetam (KEPPRA) 500 MG tablet Take 1 tablet (500 mg total) by mouth 2 (two) times daily. 09/04/14   Layne Benton, NP  lisinopril-hydrochlorothiazide (PRINZIDE,ZESTORETIC) 20-12.5 MG per tablet Take 1 tablet by mouth daily.    [provider]  metFORMIN (GLUCOPHAGE) 1000 MG tablet Take 1,000 mg by mouth 2 (two) times daily with a meal.    [provider]  metoprolol succinate (TOPROL-XL) 100 MG 24 hr tablet Take 100 mg by mouth daily. Take with or immediately following a meal.    [provider]   omeprazole (PRILOSEC) 20 MG capsule Take 20 mg by mouth 2 (two) times daily before a meal.    [provider]    Physical Exam: Vitals:   01/12/23 1007 01/12/23 1016  BP:  114/76  Pulse:  89  Resp:  18  Temp:  98.6 F (37 C)  TempSrc:  Oral  SpO2:  98%  Weight: 114.3 kg   Height: 6\' 1"  (1.854 m)    Physical Exam Constitutional:      Appearance: He is normal weight.  HENT:     Head: Normocephalic.     Mouth/Throat:     Mouth: Mucous membranes are moist.  Eyes:     Pupils: Pupils are equal, round, and reactive to light.  Cardiovascular:     Rate and Rhythm: Normal rate and regular rhythm.  Pulmonary:     Effort: Pulmonary effort is normal.  Abdominal:     General: Bowel sounds are normal.  Neurological:     General: No focal deficit present.              Data Reviewed:  There are no new results to review at this time.  DG Foot Complete Right CLINICAL DATA:  Infection.  EXAM: RIGHT FOOT COMPLETE - 3+ VIEW  COMPARISON:  January 28, 2022.  FINDINGS: Status post amputation of fifth metatarsal and phalanges. No acute fracture or dislocation is noted. Focal defect is seen involving the medial aspect of the distal portion of first distal phalanx suggesting osteomyelitis. Vascular calcifications are noted.  IMPRESSION: Possible defect seen involving distal portion of first distal phalanx suggesting osteomyelitis. MRI may be performed for further evaluation.  Electronically Signed   By: Lupita Raider M.D.   On: 01/12/2023 11:44 DG Ankle Complete Right CLINICAL DATA:  Infection.  EXAM: RIGHT ANKLE - COMPLETE 3+ VIEW  COMPARISON:  None Available.  FINDINGS: There is no evidence of fracture, dislocation, or joint effusion. There is no evidence of arthropathy or other focal bone abnormality. No lytic destruction is noted. Vascular calcifications are noted.  IMPRESSION: No acute abnormality seen.  Electronically Signed   By: Lupita Raider M.D.   On: 01/12/2023 11:41  Lab Results  Component Value Date   WBC 14.4 (H) 01/12/2023   HGB 15.8 01/12/2023   HCT 45.3 01/12/2023   MCV 91.7 01/12/2023   PLT 125 (L) 01/12/2023   Last metabolic panel Lab Results  Component Value Date   GLUCOSE 295 (H) 01/12/2023   NA 131 (L) 01/12/2023   K 3.2 (L) 01/12/2023   CL 96 (L) 01/12/2023   CO2 17 (L) 01/12/2023   BUN 23 (H) 01/12/2023   CREATININE 1.36 (H) 01/12/2023   GFRNONAA >60 01/12/2023   CALCIUM 9.5 01/12/2023   PROT 7.8 05/01/2022   ALBUMIN 4.0 05/01/2022   LABGLOB 3.9 05/02/2020   BILITOT 0.5 05/01/2022   ALKPHOS 90 05/01/2022   AST 38 05/01/2022   ALT 14 05/01/2022   ANIONGAP 18 (H) 01/12/2023    Assessment and Plan: * Wound of right lower extremity Right lower extremity redness and swelling status post trauma with concern for possible osteomyelitis on plain films Noted baseline history of prior fifth great toe amputation 01/23/2022 for osteomyelitis In discussion with the ER PA Poggi, podiatry does not feel like this is acute osteomyelitis at present Recommending IV antibiotics overnight observation Deferring MRI for nwo  Will place on IV Rocephin, Flagyl and vancomycin for infectious coverage Blood cultures obtained Sed rate and CRP are pending Follow-up formal podiatry recommendations  Type 2 diabetes mellitus (HCC) Blood sugar in upper 200s without overt acidosis Placed on sliding scale insulins A1c IV fluid hydration Monitor  Essential hypertension BP stable Titrate home regimen  Seizures (HCC) Continue Keppra  Anxiety and depression Continue Xanax   Greater than 50% was spent in counseling and coordination of care with patient Total encounter time 80 minutes or more    Advance Care Planning:   Code Status: Full Code   Consults: Pending podiatry consult   Family Communication: No family at the bedside   Severity of Illness: The appropriate patient status for this patient is  INPATIENT. Inpatient status is judged to be reasonable and necessary in order to provide the required intensity of service to ensure the patient's safety. The patient's presenting symptoms, physical exam findings, and initial radiographic and laboratory data in the context of their chronic comorbidities is felt to place them at high risk for further clinical deterioration. Furthermore, it is not anticipated that the patient will be medically stable for discharge from the hospital within 2 midnights of admission.   *  I certify that at the point of admission it is my clinical judgment that the patient will require inpatient hospital care spanning beyond 2 midnights from the point of admission due to high intensity of service, high risk for further deterioration and high frequency of surveillance required.*  Author: Floydene Flock, MD 01/12/2023 1:40 PM  For on call review [email or web address].

## 2023-01-12 NOTE — Assessment & Plan Note (Addendum)
Right lower extremity redness and swelling status post trauma with concern for possible osteomyelitis on plain films Noted baseline history of prior fifth great toe amputation 01/23/2022 for osteomyelitis In discussion with the ER PA Poggi, podiatry does not feel like this is acute osteomyelitis at present Recommending IV antibiotics overnight observation Deferring MRI for nwo  Will place on IV Rocephin, Flagyl and vancomycin for infectious coverage Blood cultures obtained Sed rate and CRP are pending Follow-up formal podiatry recommendations

## 2023-01-12 NOTE — Assessment & Plan Note (Signed)
BP stable Titrate home regimen 

## 2023-01-12 NOTE — ED Notes (Signed)
Latic  3.2

## 2023-01-13 ENCOUNTER — Inpatient Hospital Stay: Payer: Medicaid Other

## 2023-01-13 ENCOUNTER — Encounter: Payer: Self-pay | Admitting: Family Medicine

## 2023-01-13 DIAGNOSIS — S91105D Unspecified open wound of left lesser toe(s) without damage to nail, subsequent encounter: Secondary | ICD-10-CM | POA: Diagnosis not present

## 2023-01-13 DIAGNOSIS — L539 Erythematous condition, unspecified: Secondary | ICD-10-CM | POA: Diagnosis not present

## 2023-01-13 DIAGNOSIS — I776 Arteritis, unspecified: Secondary | ICD-10-CM | POA: Diagnosis present

## 2023-01-13 DIAGNOSIS — E1151 Type 2 diabetes mellitus with diabetic peripheral angiopathy without gangrene: Secondary | ICD-10-CM | POA: Diagnosis present

## 2023-01-13 DIAGNOSIS — Z8673 Personal history of transient ischemic attack (TIA), and cerebral infarction without residual deficits: Secondary | ICD-10-CM | POA: Diagnosis not present

## 2023-01-13 DIAGNOSIS — Z87891 Personal history of nicotine dependence: Secondary | ICD-10-CM | POA: Diagnosis not present

## 2023-01-13 DIAGNOSIS — E1165 Type 2 diabetes mellitus with hyperglycemia: Secondary | ICD-10-CM | POA: Diagnosis present

## 2023-01-13 DIAGNOSIS — G40909 Epilepsy, unspecified, not intractable, without status epilepticus: Secondary | ICD-10-CM | POA: Diagnosis present

## 2023-01-13 DIAGNOSIS — R233 Spontaneous ecchymoses: Secondary | ICD-10-CM | POA: Diagnosis present

## 2023-01-13 DIAGNOSIS — Z89421 Acquired absence of other right toe(s): Secondary | ICD-10-CM | POA: Diagnosis not present

## 2023-01-13 DIAGNOSIS — I10 Essential (primary) hypertension: Secondary | ICD-10-CM | POA: Diagnosis present

## 2023-01-13 DIAGNOSIS — Z806 Family history of leukemia: Secondary | ICD-10-CM | POA: Diagnosis not present

## 2023-01-13 DIAGNOSIS — F32A Depression, unspecified: Secondary | ICD-10-CM | POA: Diagnosis present

## 2023-01-13 DIAGNOSIS — F411 Generalized anxiety disorder: Secondary | ICD-10-CM | POA: Diagnosis present

## 2023-01-13 DIAGNOSIS — Z8249 Family history of ischemic heart disease and other diseases of the circulatory system: Secondary | ICD-10-CM | POA: Diagnosis not present

## 2023-01-13 DIAGNOSIS — Z79899 Other long term (current) drug therapy: Secondary | ICD-10-CM | POA: Diagnosis not present

## 2023-01-13 DIAGNOSIS — I739 Peripheral vascular disease, unspecified: Secondary | ICD-10-CM | POA: Diagnosis not present

## 2023-01-13 DIAGNOSIS — E1142 Type 2 diabetes mellitus with diabetic polyneuropathy: Secondary | ICD-10-CM | POA: Diagnosis present

## 2023-01-13 DIAGNOSIS — R21 Rash and other nonspecific skin eruption: Secondary | ICD-10-CM | POA: Diagnosis not present

## 2023-01-13 DIAGNOSIS — L03115 Cellulitis of right lower limb: Secondary | ICD-10-CM | POA: Diagnosis present

## 2023-01-13 DIAGNOSIS — L2489 Irritant contact dermatitis due to other agents: Secondary | ICD-10-CM | POA: Diagnosis not present

## 2023-01-13 DIAGNOSIS — K219 Gastro-esophageal reflux disease without esophagitis: Secondary | ICD-10-CM | POA: Diagnosis present

## 2023-01-13 DIAGNOSIS — E1100 Type 2 diabetes mellitus with hyperosmolarity without nonketotic hyperglycemic-hyperosmolar coma (NKHHC): Secondary | ICD-10-CM | POA: Diagnosis not present

## 2023-01-13 DIAGNOSIS — Z7984 Long term (current) use of oral hypoglycemic drugs: Secondary | ICD-10-CM | POA: Diagnosis not present

## 2023-01-13 DIAGNOSIS — F419 Anxiety disorder, unspecified: Secondary | ICD-10-CM | POA: Diagnosis present

## 2023-01-13 DIAGNOSIS — L258 Unspecified contact dermatitis due to other agents: Secondary | ICD-10-CM | POA: Diagnosis present

## 2023-01-13 DIAGNOSIS — Z801 Family history of malignant neoplasm of trachea, bronchus and lung: Secondary | ICD-10-CM | POA: Diagnosis not present

## 2023-01-13 DIAGNOSIS — Z818 Family history of other mental and behavioral disorders: Secondary | ICD-10-CM | POA: Diagnosis not present

## 2023-01-13 DIAGNOSIS — S81801A Unspecified open wound, right lower leg, initial encounter: Secondary | ICD-10-CM | POA: Diagnosis not present

## 2023-01-13 DIAGNOSIS — D692 Other nonthrombocytopenic purpura: Secondary | ICD-10-CM | POA: Diagnosis present

## 2023-01-13 DIAGNOSIS — E11628 Type 2 diabetes mellitus with other skin complications: Secondary | ICD-10-CM | POA: Diagnosis present

## 2023-01-13 LAB — COMPREHENSIVE METABOLIC PANEL
ALT: 69 U/L — ABNORMAL HIGH (ref 0–44)
AST: 67 U/L — ABNORMAL HIGH (ref 15–41)
Albumin: 3.7 g/dL (ref 3.5–5.0)
Alkaline Phosphatase: 48 U/L (ref 38–126)
Anion gap: 11 (ref 5–15)
BUN: 20 mg/dL (ref 6–20)
CO2: 20 mmol/L — ABNORMAL LOW (ref 22–32)
Calcium: 8.7 mg/dL — ABNORMAL LOW (ref 8.9–10.3)
Chloride: 102 mmol/L (ref 98–111)
Creatinine, Ser: 0.84 mg/dL (ref 0.61–1.24)
GFR, Estimated: >60 mL/min (ref >60)
Glucose, Bld: 161 mg/dL — ABNORMAL HIGH (ref 70–99)
Potassium: 3.8 mmol/L (ref 3.5–5.1)
Sodium: 133 mmol/L — ABNORMAL LOW (ref 135–145)
Total Bilirubin: 0.9 mg/dL (ref 0.3–1.2)
Total Protein: 7.3 g/dL (ref 6.5–8.1)

## 2023-01-13 LAB — CBC
HCT: 35.2 % — ABNORMAL LOW (ref 39.0–52.0)
Hemoglobin: 11.9 g/dL — ABNORMAL LOW (ref 13.0–17.0)
MCH: 31.9 pg (ref 26.0–34.0)
MCHC: 33.8 g/dL (ref 30.0–36.0)
MCV: 94.4 fL (ref 80.0–100.0)
Platelets: 95 10*3/uL — ABNORMAL LOW (ref 150–400)
RBC: 3.73 MIL/uL — ABNORMAL LOW (ref 4.22–5.81)
RDW: 12.9 % (ref 11.5–15.5)
WBC: 7.9 10*3/uL (ref 4.0–10.5)
nRBC: 0 % (ref 0.0–0.2)

## 2023-01-13 LAB — GLUCOSE, CAPILLARY
Glucose-Capillary: 138 mg/dL — ABNORMAL HIGH (ref 70–99)
Glucose-Capillary: 175 mg/dL — ABNORMAL HIGH (ref 70–99)

## 2023-01-13 LAB — CBG MONITORING, ED
Glucose-Capillary: 172 mg/dL — ABNORMAL HIGH (ref 70–99)
Glucose-Capillary: 202 mg/dL — ABNORMAL HIGH (ref 70–99)

## 2023-01-13 LAB — HEMOGLOBIN A1C
Hgb A1c MFr Bld: 7.7 % — ABNORMAL HIGH (ref 4.8–5.6)
Mean Plasma Glucose: 174 mg/dL

## 2023-01-13 LAB — LACTIC ACID, PLASMA: Lactic Acid, Venous: 1.1 mmol/L (ref 0.5–1.9)

## 2023-01-13 MED ORDER — VANCOMYCIN HCL 2000 MG/400ML IV SOLN
2000.0000 mg | Freq: Two times a day (BID) | INTRAVENOUS | Status: DC
  Administered 2023-01-13 – 2023-01-14 (×2): 2000 mg via INTRAVENOUS
  Filled 2023-01-13 (×4): qty 400

## 2023-01-13 MED ORDER — VITAMIN C 500 MG PO TABS
500.0000 mg | ORAL_TABLET | Freq: Two times a day (BID) | ORAL | Status: DC
  Administered 2023-01-13 – 2023-01-15 (×4): 500 mg via ORAL
  Filled 2023-01-13 (×4): qty 1

## 2023-01-13 MED ORDER — ZINC SULFATE 220 (50 ZN) MG PO CAPS
220.0000 mg | ORAL_CAPSULE | Freq: Every day | ORAL | Status: DC
  Administered 2023-01-13 – 2023-01-15 (×3): 220 mg via ORAL
  Filled 2023-01-13 (×3): qty 1

## 2023-01-13 MED ORDER — ADULT MULTIVITAMIN W/MINERALS CH
1.0000 | ORAL_TABLET | Freq: Every day | ORAL | Status: DC
  Administered 2023-01-13 – 2023-01-15 (×3): 1 via ORAL
  Filled 2023-01-13 (×3): qty 1

## 2023-01-13 MED ORDER — ENSURE MAX PROTEIN PO LIQD
11.0000 [oz_av] | Freq: Every day | ORAL | Status: DC
  Administered 2023-01-13 – 2023-01-14 (×2): 11 [oz_av] via ORAL
  Filled 2023-01-13: qty 330

## 2023-01-13 NOTE — Consult Note (Signed)
ORTHOPAEDIC CONSULTATION  REQUESTING PHYSICIAN: Wouk, Wilfred Curtis, MD  Chief Complaint: Bilateral lower extremity erythema and ecchymosis  HPI: Daniel Ritter is a 47 y.o. male who complains of worsening discoloration to bilateral lower extremities.  He has been wrapping his lower extremities in the past.  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  He states he has noticed worsening red discoloration to bilateral lower extremities over the last week or so.  Denies trauma to the area.  Does have a history of diabetes with neuropathy.  Past Medical History:  Diagnosis Date   Allergy    seasonal   Anxiety    Diabetes mellitus without complication (HCC)    GERD (gastroesophageal reflux disease)    H/O ETOH abuse    History of rectal abscess    Hypertension 2007   Neuropathy    Pancreatitis    history of, pt states due to alcohol   Seizures (HCC) 08/2014   " two seizures in the hospital"   Stroke [facility]) 08/31/2013   Thrombocytopenia [facility])    Past Surgical History:  Procedure Laterality Date   AMPUTATION TOE Right 01/30/2022   Procedure: PARTIAL 5TH RAY AMUPTATION RIGHT FOOT;  Surgeon: Gwyneth Revels, DPM;  Location: [facility] ORS;  Service: Podiatry;  Laterality: Right;   ANTERIOR CRUCIATE LIGAMENT REPAIR Left 1992, 1993   COLONOSCOPY N/A 09/04/2021   Procedure: COLONOSCOPY;  Surgeon: Toledo, Boykin Nearing, MD;  Location: [facility] ENDOSCOPY;  Service: Gastroenterology;  Laterality: N/A;  DM   INCISION AND DRAINAGE PERIRECTAL ABSCESS N/A 12/14/2015   Procedure: IRRIGATION AND DEBRIDEMENT PERIRECTAL ABSCESS;  Surgeon: Kieth Brightly, MD;  Location: [facility] ORS;  Service: General;  Laterality: N/A;   IRRIGATION AND DEBRIDEMENT FOOT Bilateral 01/30/2022   Procedure: IRRIGATION AND DEBRIDEMENT TENDON ULCER LEFT FOOT,  EXCISION OF 2ND TOE JOINT LEFT FOOT, IRRIGATION AND DEBRIDEMENT SUBCUTANEOUS ULCER RIGHT FOOT;  Surgeon: Gwyneth Revels, DPM;  Location: [facility] ORS;   Service: Podiatry;  Laterality: Bilateral;   IRRIGATION AND DEBRIDEMENT FOOT Right 05/01/2022   Procedure: IRRIGATION AND DEBRIDEMENT FOOT;SOFT TISSUE AND BONE;  Surgeon: Linus Galas, DPM;  Location: [facility] ORS;  Service: Podiatry;  Laterality: Right;   LOWER EXTREMITY ANGIOGRAPHY Right 02/04/2022   Procedure: Lower Extremity Angiography;  Surgeon: Renford Dills, MD;  Location: [facility] INVASIVE CV LAB;  Service: Cardiovascular;  Laterality: Right;   Social History   Socioeconomic History   Marital status: Divorced    Spouse name: Not on file   Number of children: 0   Years of education: 14   Highest education level: Not on file  Occupational History   Occupation: Event organiser: [employer]   Occupation: diabled  Tobacco Use   Smoking status: Former    Packs/day: 0.25    Years: 18.00    Additional pack years: 0.00    Total pack years: 4.50    Types: Cigarettes    Quit date: 04/11/2020    Years since quitting: 2.7   Smokeless tobacco: Former    Types: Snuff    Quit date: 2001   Tobacco comments:    patient quit for 3 years and then started back . 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[place], no children   Right handed   caffeine use -  2 cups coffee daily, usually decaf   Social Determinants of Health   Financial Resource Strain: Not on file  Food Insecurity: No Food Insecurity (01/13/2023)   Hunger Vital Sign    Worried About Running Out of Food in the Last Year: Never true    Ran Out of Food in the Last Year: Never true  Transportation Needs: No Transportation  Needs (01/13/2023)   PRAPARE - Administrator, Civil Service (Medical): No    Lack of Transportation (Non-Medical): No  Physical Activity: Not on file  Stress: Not on file  Social Connections: Not on file   Family History  Problem Relation Age of Onset   Hypertension Mother    Anxiety disorder Mother    Hypertension Father    Hypertension Brother    Leukemia Maternal Grandfather    Lung cancer Paternal Grandfather    No Known Allergies Prior to Admission medications   Medication Sig Start Date End Date Taking? Authorizing Provider  acetaminophen (TYLENOL) 325 MG tablet Take 2 tablets (650 mg total) by mouth every 6 (six) hours as needed for mild pain (or Fever >/= 101). 02/05/22  Yes Lurene Shadow, MD  ALPRAZolam Prudy Feeler) 0.25 MG tablet Take 0.25 mg by mouth 2 (two) times daily.   Yes [provider]  amLODipine (NORVASC) 10 MG tablet Take 10 mg by mouth daily as needed. 09/02/21  Yes [provider]  chlorhexidine (PERIDEX) 0.12 % solution Use as directed 15 mLs in the mouth or throat 2 (two) times daily. 10/22/22  Yes [provider]  DULoxetine (CYMBALTA) 30 MG capsule Take 30 mg by mouth daily. 11/08/21  Yes [provider]  JANUVIA 100 MG tablet Take 100 mg by mouth daily. 04/30/20  Yes [provider]  levETIRAcetam (KEPPRA) 500 MG tablet Take 1 tablet (500 mg total) by mouth 2 (two) times daily. 09/04/14  Yes Layne Benton, NP  lisinopril-hydrochlorothiazide (ZESTORETIC) 20-25 MG tablet Take 1 tablet by mouth daily. 10/31/22  Yes [provider]  metFORMIN (GLUCOPHAGE) 1000 MG tablet Take 1,000 mg by mouth 2 (two) times daily with a meal.   Yes [provider]  metoprolol succinate (TOPROL-XL) 100 MG 24 hr tablet Take 100 mg by mouth daily. Take with or immediately following a meal.   Yes [provider]  Multiple Vitamins-Minerals (MULTIVITAMIN WITH MINERALS) tablet Take 1 tablet by mouth daily.   Yes  [provider]  omeprazole (PRILOSEC) 20 MG capsule Take 20 mg by mouth 2 (two) times daily before a meal.   Yes [provider]  ciprofloxacin (CIPRO) 500 MG tablet Take 1 tablet by mouth 2 (two) times daily. Patient not taking: Reported on 01/12/2023 01/06/23 01/13/23  [provider]  lisinopril-hydrochlorothiazide (PRINZIDE,ZESTORETIC) 20-12.5 MG per tablet Take 1 tablet by mouth daily. Patient not taking: Reported on 01/12/2023    [provider]  metFORMIN (GLUCOPHAGE) 500 MG tablet Take 500 mg by mouth 2 (two) times daily. Patient not taking: Reported on 01/12/2023 12/08/22   [provider]   US ARTERIAL LOWER EXTREMITY DUPLEX BILATERAL  Result Date: 01/13/2023 CLINICAL DATA:  Osteomyelitis EXAM: BILATERAL LOWER EXTREMITY ARTERIAL DUPLEX  SCAN TECHNIQUE: Gray-scale sonography as well as color Doppler and duplex ultrasound was performed to evaluate the arteries of both lower extremities including the common, superficial and profunda femoral arteries, popliteal artery and calf arteries. COMPARISON:  None available FINDINGS: Right Lower Extremity Inflow: Normal common femoral arterial waveforms and velocities. No evidence of inflow (aortoiliac) disease. Outflow: Normal profunda femoral, superficial femoral and popliteal arterial waveforms and velocities. No focal elevation of the PSV to suggest stenosis. Runoff: Normal posterior and anterior tibial arterial waveforms and velocities. Vessels are patent to the ankle. Left Lower Extremity Inflow: Normal common femoral arterial waveforms and velocities. No evidence of inflow (aortoiliac) disease. Outflow: Normal profunda femoral, superficial femoral and popliteal arterial waveforms and velocities. No focal elevation of the PSV to suggest stenosis. Runoff: Normal posterior and anterior tibial arterial waveforms and velocities. Vessels are patent to the ankle. IMPRESSION: 1. Diffusely calcified lower extremity arteries. 2.  No Doppler evidence of significant stenosis of lower extremity arteries. Electronically Signed   By: Acquanetta Belling M.D.   On: 01/13/2023 11:51   DG Foot Complete Right  Result Date: 01/12/2023 CLINICAL DATA:  Infection. EXAM: RIGHT FOOT COMPLETE - 3+ VIEW COMPARISON:  January 28, 2022. FINDINGS: Status post amputation of fifth metatarsal and phalanges. No acute fracture or dislocation is noted. Focal defect is seen involving the medial aspect of the distal portion of first distal phalanx suggesting osteomyelitis. Vascular calcifications are noted. IMPRESSION: Possible defect seen involving distal portion of first distal phalanx suggesting osteomyelitis. MRI may be performed for further evaluation. Electronically Signed   By: Lupita Raider M.D.   On: 01/12/2023 11:44   DG Ankle Complete Right  Result Date: 01/12/2023 CLINICAL DATA:  Infection. EXAM: RIGHT ANKLE - COMPLETE 3+ VIEW COMPARISON:  None Available. FINDINGS: There is no evidence of fracture, dislocation, or joint effusion. There is no evidence of arthropathy or other focal bone abnormality. No lytic destruction is noted. Vascular calcifications are noted. IMPRESSION: No acute abnormality seen. Electronically Signed   By: Lupita Raider M.D.   On: 01/12/2023 11:41    Positive ROS: All other systems have been reviewed and were otherwise negative with the exception of those mentioned in the HPI and as above.  12 point ROS was performed.  Physical Exam: General: Alert and oriented.  No apparent distress.  Vascular:  Left foot:Dorsalis Pedis:  diminished Posterior Tibial:  diminished  Right foot: Dorsalis Pedis:  diminished Posterior Tibial:  diminished  Vascular ultrasound: Narrative & Impression  CLINICAL DATA:  Osteomyelitis   EXAM: BILATERAL LOWER EXTREMITY ARTERIAL DUPLEX SCAN   TECHNIQUE: Gray-scale sonography as well as color Doppler and duplex ultrasound was performed to evaluate the arteries of both lower  extremities including the common, superficial and profunda femoral arteries, popliteal artery and calf arteries.   COMPARISON:  None available   FINDINGS: Right Lower Extremity   Inflow: Normal common femoral arterial waveforms and velocities. No evidence of inflow (aortoiliac) disease.   Outflow: Normal profunda femoral, superficial femoral and popliteal arterial waveforms and velocities. No focal elevation of the PSV to suggest stenosis.   Runoff: Normal posterior and anterior tibial arterial waveforms and velocities. Vessels are patent to the ankle.   Left Lower Extremity   Inflow: Normal common femoral arterial waveforms and velocities. No evidence of inflow (aortoiliac) disease.   Outflow: Normal profunda femoral, superficial femoral and popliteal arterial waveforms and velocities. No focal elevation of the PSV to suggest stenosis.   Runoff: Normal posterior and anterior  tibial arterial waveforms and velocities. Vessels are patent to the ankle.   IMPRESSION: 1. Diffusely calcified lower extremity arteries. 2. No Doppler evidence of significant stenosis of lower extremity arteries.   Neuro:absent protective sensation  Derm: On the lateral aspect of the left fifth metatarsal head there is a small focal superficial necrotic pressure induced wound.  No active drainage.  No purulence at this time.  To bilateral lower extremities he has diffuse erythematous and ecchymotic purpura.  Areas of geographic demarcation are noted.  No signs of active open ulceration or purulent drainage throughout.  See clinical pictures.        Ortho/MS:   Status post right fifth ray amputation   Assessment: Diffuse bilateral lower extremity purpuric and erythemic rash. Left fifth metatarsal head pressure induced ulcer  Plan: For the fifth metatarsal ulcer we will have to monitor this.  Vascular evaluation showed good circulation.  I suspect this was a pressure induced lesion but there  is areas of superficial necrosis.  If this becomes full-thickness or infected may need to perform debridement.  We will monitor peripherally at this time.  The bilateral lower extremity rash I suspect is dermatological in nature.  Would recommend dermatology consult for evaluation.  Vascular evaluation shows good blood flow on arterial ultrasound.  This could possibly be a contact dermatitis versus vasculitis.  No signs of acute infection.    Daniel Ritter, DPM Cell [phone]   01/13/2023 12:38 PM

## 2023-01-13 NOTE — Progress Notes (Signed)
Initial Nutrition Assessment  DOCUMENTATION CODES:   Obesity unspecified  INTERVENTION:   -MVI with minerals daily -500 mg vitamin C BID -200 mg zinc sulfate daily x 14 days -Ensure Max po daily, each supplement provides 150 kcal and 30 grams of protein.   -Liberalize diet to carb modified -Double protein portions with meals  NUTRITION DIAGNOSIS:   Increased nutrient needs related to wound healing as evidenced by estimated needs.  GOAL:   Patient will meet greater than or equal to 90% of their needs  MONITOR:   PO intake, Supplement acceptance  REASON FOR ASSESSMENT:   Consult Assessment of nutrition requirement/status, Wound healing  ASSESSMENT:   Pt with medical history significant of osteomyelitis status post fifth ray amputation, type 2 diabetes, peripheral neuropathy, hypertension, anxiety depression, seizure disorder presenting with right lower extremity wound  Pt admitted with rash and DM foot infection.   Reviewed I/O's: +1.2 L x 24 hours   UOP: 400 ml x 24 hours   Per podiatry consult, plan to monitor lt fifth metatarsal ulcer (vascular evaluation revealed good circulation) will be monitored peripherally. Suspect bilateral lower extremity rash is dermatological in nature; possibly contact dermatitis vs vasculitis.   Spoke with pt and mother at bedside. Pt is familiar to this RD due to prior admission. Pt reports feeling better since being admitted to the hospital. He shares that he has a good appetite, but admits to noncompliance with diet (has been eating a lot of cheeseburgers and chicken wings per his report). He checks his CBGS and they usually run in the 120's, but has been running over 200's over the past few weeks. He is compliant with his medications, but curious about insulin administration in hospital. RD discussed difference between inpatient and outpatient glycemic control, blood sugar's response to stress/ infection, and use of insulin for tighter  control in hospital.   Per pt, he noticed open areas on his foot a few days ago when he hit his foot on a piece of furniture. He shares that he wraps his feet daily and does not walk around barefoot. Pt cares for his dad, who has lymphedema suspected from agent orange exposure and wonders is rash is related to this (pt was not using gloves when changing his dad's dressings and reports his brother has a similar small rash on his hand).   Reviewed wt hx; pt with no wt loss over the past year. Pt shares that his oral intake has improved and has been focused on eating more protein over the past few months since he was last hospitalized, which has resulted in weight gain.   Pt admits that he has abstained from tobacco and alcohol over the past 2-3 years.   Discussed importance of good meal and supplement intake to promote healing. Pt amenable to supplements.   Medications reviewed and include keppra and protonoix.   Lab Results  Component Value Date   HGBA1C 7.7 (H) 01/12/2023   PTA DM medications are 100 mg [place] daily and 500 mg metformin BID.   Labs reviewed: CBGS: 202 (inpatient orders for glycemic control are 0-5 units insulin aspart TID with meals, 0-5 units insulin aspart daily at bedtime, and 3 units insulin apsart TID with meals).    NUTRITION - FOCUSED PHYSICAL EXAM:  Flowsheet Row Most Recent Value  Orbital Region No depletion  Upper Arm Region No depletion  Thoracic and Lumbar Region No depletion  Buccal Region No depletion  Temple Region No depletion  Clavicle Bone Region  No depletion  Clavicle and Acromion Bone Region No depletion  Scapular Bone Region No depletion  Dorsal Hand No depletion  Patellar Region No depletion  Anterior Thigh Region No depletion  Posterior Calf Region No depletion  Edema (RD Assessment) Mild  Hair Reviewed  Eyes Reviewed  Mouth Reviewed  Skin Reviewed  Nails Reviewed       Diet Order:   Diet Order             Diet Carb Modified  Fluid consistency: Thin  Diet effective now                   EDUCATION NEEDS:   Education needs have been addressed  Skin:  Skin Assessment: Reviewed RN Assessment  Last BM:  01/13/23  Height:   Ht Readings from Last 1 Encounters:  01/12/23 6\' 1"  (1.854 m)    Weight:   Wt Readings from Last 1 Encounters:  01/12/23 114.3 kg    Ideal Body Weight:  83.6 kg  BMI:  Body mass index is 33.25 kg/m.  Estimated Nutritional Needs:   Kcal:  2100-2300  Protein:  105-120 grams  Fluid:  > 2 L    Levada Schilling, RD, LDN, CDCES Registered Dietitian II Certified Diabetes Care and Education Specialist Please refer to [facility] for RD and/or RD on-call/weekend/after hours pager

## 2023-01-13 NOTE — Consult Note (Signed)
NAME: Daniel Ritter  DOB: 27-Mar-1976  MRN: [id number]  Date/Time: 01/13/2023 2:08 PM  REQUESTING PROVIDER: Dr. Ashok Pall Subjective:  REASON FOR CONSULT: Bilateral foot infection ? Daniel Ritter is a 47 y.o. male with a history of diabetes mellitus, peripheral neuropathy history of right fifth ray amputation for osteomyelitis hypertension, seizure disorder presents with bilateral foot erythema and itching Patient states she has he has been wrapping both his feet for many months now He used a different wrap He notices redness appearing on the dorsum of the foot and extending to his leg in the last 5 days It was itchy Same thing happened to the left foot but it was limited Patient is sustained injury to his toes by a knocking on things. He also has a wound on his fifth toe on the left foot but he is not sure how he got it Does not have any fever or chills He is back pain since he lifted his dad He also has a rash on his back because he says he has been in bed resting for the back pain  Past Medical History:  Diagnosis Date   Allergy    seasonal   Anxiety    Diabetes mellitus without complication (HCC)    GERD (gastroesophageal reflux disease)    H/O ETOH abuse    History of rectal abscess    Hypertension 2007   Neuropathy    Pancreatitis    history of, pt states due to alcohol   Seizures (HCC) 08/2014   " two seizures in the hospital"   Stroke [facility]) 08/31/2013   Thrombocytopenia (HCC)     Past Surgical History:  Procedure Laterality Date   AMPUTATION TOE Right 01/30/2022   Procedure: PARTIAL 5TH RAY AMUPTATION RIGHT FOOT;  Surgeon: Gwyneth Revels, DPM;  Location: [facility] ORS;  Service: Podiatry;  Laterality: Right;   ANTERIOR CRUCIATE LIGAMENT REPAIR Left 1992, 1993   COLONOSCOPY N/A 09/04/2021   Procedure: COLONOSCOPY;  Surgeon: Toledo, Boykin Nearing, MD;  Location: [facility] ENDOSCOPY;  Service: Gastroenterology;  Laterality: N/A;  DM   INCISION AND DRAINAGE PERIRECTAL ABSCESS N/A 12/14/2015    Procedure: IRRIGATION AND DEBRIDEMENT PERIRECTAL ABSCESS;  Surgeon: Kieth Brightly, MD;  Location: [facility] ORS;  Service: General;  Laterality: N/A;   IRRIGATION AND DEBRIDEMENT FOOT Bilateral 01/30/2022   Procedure: IRRIGATION AND DEBRIDEMENT TENDON ULCER LEFT FOOT,  EXCISION OF 2ND TOE JOINT LEFT FOOT, IRRIGATION AND DEBRIDEMENT SUBCUTANEOUS ULCER RIGHT FOOT;  Surgeon: Gwyneth Revels, DPM;  Location: [facility] ORS;  Service: Podiatry;  Laterality: Bilateral;   IRRIGATION AND DEBRIDEMENT FOOT Right 05/01/2022   Procedure: IRRIGATION AND DEBRIDEMENT FOOT;SOFT TISSUE AND BONE;  Surgeon: Linus Galas, DPM;  Location: [facility] ORS;  Service: Podiatry;  Laterality: Right;   LOWER EXTREMITY ANGIOGRAPHY Right 02/04/2022   Procedure: Lower Extremity Angiography;  Surgeon: Renford Dills, MD;  Location: [facility] INV[facility];  Service: Cardiovascular;  Laterality: Right;    Social History   Socioeconomic History   Marital status: Divorced    Spouse name: Not on file   Number of children: 0   Years of education: 14   Highest education level: Not on file  Occupational History   Occupation: Event organiser: [employer]   Occupation: diabled  Tobacco Use   Smoking status: Former    Packs/day: 0.25    Years: 18.00    Additional pack years: 0.00    Total pack years: 4.50    Types: Cigarettes  Quit date: 04/11/2020    Years since quitting: 2.7   Smokeless tobacco: Former    Types: Snuff    Quit date: 2001   Tobacco comments:    patient quit for 3 years and then started back . 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[place], no children   Right handed   caffeine use -  2 cups coffee daily, usually decaf   Social Determinants of Health   Financial Resource Strain: Not on file  Food Insecurity: No Food Insecurity (01/13/2023)   Hunger Vital Sign    Worried About Running Out of Food in the Last Year: Never true    Ran Out of Food in the Last Year: Never true  Transportation Needs: No Transportation Needs (01/13/2023)   PRAPARE - Administrator, Civil Service (Medical): No    Lack of Transportation (Non-Medical): No  Physical Activity: Not on file  Stress: Not on file  Social Connections: Not on file  Intimate Partner Violence: Not At Risk (01/13/2023)   Humiliation, Afraid, Rape, and Kick questionnaire    Fear of Current or Ex-Partner: No    Emotionally Abused: No    Physically Abused: No    Sexually Abused: No    Family History  Problem Relation Age of Onset   Hypertension Mother    Anxiety disorder Mother    Hypertension Father    Hypertension Brother    Leukemia Maternal Grandfather    Lung cancer Paternal Grandfather    No Known Allergies I? Current Facility-Administered Medications  Medication Dose Route Frequency Provider Last Rate Last Admin   0.9 %  sodium chloride infusion   Intravenous Continuous Floydene Flock, MD   Stopped at 01/13/23 0407   acetaminophen (TYLENOL) tablet 650 mg  650 mg Oral Q6H PRN Andris Baumann, MD       Or   acetaminophen (TYLENOL) suppository 650 mg  650 mg Rectal Q6H PRN Andris Baumann, MD       acetaminophen (TYLENOL) tablet 650 mg  650 mg Oral Q6H PRN Floydene Flock, MD   650 mg at 01/12/23 1708   ALPRAZolam (XANAX) tablet 0.25 mg  0.25 mg Oral BID Floydene Flock, MD   0.25 mg at 01/13/23 1250   amLODipine (NORVASC) tablet 5 mg  5 mg Oral Daily Floydene Flock, MD   5 mg at 01/13/23 1023   cefTRIAXone (ROCEPHIN) 2 g in sodium chloride 0.9 % 100 mL IVPB  2 g Intravenous Q24H Floydene Flock, MD   Stopped at  01/12/23 2156   DULoxetine (CYMBALTA) DR capsule 30 mg  30 mg Oral Daily Floydene Flock, MD   30 mg at 01/13/23 1023   enoxaparin (LOVENOX) injection 60 mg  60 mg Subcutaneous Q24H Floydene Flock, MD   60 mg at 01/12/23 2105   lisinopril (ZESTRIL) tablet 20 mg  20 mg Oral Daily Floydene Flock, MD   20 mg at 01/13/23  1022   And   hydrochlorothiazide (HYDRODIURIL) tablet 12.5 mg  12.5 mg Oral Daily Floydene Flock, MD   12.5 mg at 01/13/23 1023   HYDROcodone-acetaminophen (NORCO/VICODIN) 5-325 MG per tablet 1-2 tablet  1-2 tablet Oral Q4H PRN Andris Baumann, MD   2 tablet at 01/13/23 1250   insulin aspart (novoLOG) injection 0-15 Units  0-15 Units Subcutaneous TID WC Floydene Flock, MD   5 Units at 01/13/23 1250   insulin aspart (novoLOG) injection 0-5 Units  0-5 Units Subcutaneous QHS Floydene Flock, MD       insulin aspart (novoLOG) injection 3 Units  3 Units Subcutaneous TID WC Floydene Flock, MD   3 Units at 01/13/23 1157   levETIRAcetam (KEPPRA) tablet 500 mg  500 mg Oral BID Floydene Flock, MD   500 mg at 01/13/23 1022   metoprolol succinate (TOPROL-XL) 24 hr tablet 100 mg  100 mg Oral Daily Floydene Flock, MD   100 mg at 01/13/23 1023   metroNIDAZOLE (FLAGYL) IVPB 500 mg  500 mg Intravenous Q12H Floydene Flock, MD   Stopped at 01/13/23 1134   ondansetron (ZOFRAN) tablet 4 mg  4 mg Oral Q6H PRN Floydene Flock, MD       Or   ondansetron [facility]) injection 4 mg  4 mg Intravenous Q6H PRN Floydene Flock, MD       pantoprazole (PROTONIX) EC tablet 40 mg  40 mg Oral Daily Floydene Flock, MD   40 mg at 01/13/23 1022   vancomycin (VANCOREADY) IVPB 2000 mg/400 mL  2,000 mg Intravenous Q12H Barrie Folk, RPH         Abtx:  Anti-infectives (From admission, onward)    Start     Dose/Rate Route Frequency Ordered Stop   01/13/23 1500  vancomycin (VANCOREADY) IVPB 2000 mg/400 mL        2,000 mg 200 mL/hr over 120 Minutes Intravenous Every 12 hours 01/13/23 1026      01/13/23 0300  vancomycin (VANCOREADY) IVPB 1250 mg/250 mL  Status:  Discontinued        1,250 mg 166.7 mL/hr over 90 Minutes Intravenous Every 12 hours 01/12/23 1350 01/13/23 1026   01/12/23 2200  cefTRIAXone (ROCEPHIN) 2 g in sodium chloride 0.9 % 100 mL IVPB        2 g 200 mL/hr over 30 Minutes Intravenous Every 24 hours 01/12/23 1324 01/19/23 2159   01/12/23 2200  metroNIDAZOLE (FLAGYL) IVPB 500 mg        500 mg 100 mL/hr over 60 Minutes Intravenous Every 12 hours 01/12/23 1324 01/19/23 2159   01/12/23 1600  vancomycin (VANCOREADY) IVPB 500 mg/100 mL        500 mg 100 mL/hr over 60 Minutes Intravenous  Once 01/12/23 1350 01/12/23 1702   01/12/23 1300  vancomycin (VANCOREADY) IVPB 2000 mg/400 mL        2,000 mg 200 mL/hr over 120 Minutes Intravenous  Once 01/12/23 1247 01/12/23 1544   01/12/23 1245  piperacillin-tazobactam (ZOSYN) IVPB 3.375 g        3.375 g 100 mL/hr over 30 Minutes Intravenous  Once 01/12/23 1238 01/12/23 1413       REVIEW OF SYSTEMS:  Const: negative fever, negative chills, negative weight loss Eyes: negative diplopia or visual changes, negative eye pain ENT: negative coryza, negative sore throat Resp: negative cough, hemoptysis, dyspnea Cards: negative for chest pain, palpitations, lower extremity edema GU: negative for frequency, dysuria and hematuria  GI: Negative for abdominal pain, diarrhea, bleeding, constipation Skin: negative for rash and pruritus Heme: negative for easy bruising and gum/nose bleeding MS: negative for myalgias, arthralgias, back pain and muscle weakness Neurolo:negative for headaches, dizziness, vertigo, memory problems negative for feelings of anxiety, depression  Endocrine:  diabetes Allergy/Immunology- negative for any medication or food allergies ?  Objective:  VITALS:  BP 109/80 (BP Location: Left Arm)   Pulse 84   Temp 98.2 F (36.8 C) (Oral)   Resp 16   Ht 6\' 1"  (1.854 m)   Wt 114.3 kg   SpO2 97%   BMI 33.25 kg/m    PHYSICAL EXAM:  General: Alert, cooperative, no distress, appears stated age.  Head: Normocephalic, without obvious abnormality, atraumatic. Eyes: Conjunctivae clear, anicteric sclerae. Pupils are equal ENT Nares normal. No drainage or sinus tenderness. Lips, mucosa, and tongue normal. No Thrush Gingival hypertrophy Neck: Supple, symmetrical, no adenopathy, thyroid: non tender no carotid bruit and no JVD. Back: No CVA tenderness. Lungs: Clear to auscultation bilaterally. No Wheezing or Rhonchi. No rales. Heart: Regular rate and rhythm, no murmur, rub or gallop. Abdomen: Soft, non-tender,not distended. Bowel sounds normal. No masses Extremities: Purpuric, ecchymotic erythema with some redness to it on the dorsum of both feet and also extending up the right leg           Skin: Papular eruption on the back  Lymph: Cervical, supraclavicular normal. Neurologic: Grossly non-focal Pertinent Labs Lab Results CBC    Component Value Date/Time   WBC 7.9 01/13/2023 0415   RBC 3.73 (L) 01/13/2023 0415   HGB 11.9 (L) 01/13/2023 0415   HGB 13.9 10/30/2011 1123   HCT 35.2 (L) 01/13/2023 0415   HCT 37.0 (L) 10/10/2011 1407   PLT 95 (L) 01/13/2023 0415   PLT 191 10/30/2011 1123   MCV 94.4 01/13/2023 0415   MCV 89 10/10/2011 1407   MCH 31.9 01/13/2023 0415   MCHC 33.8 01/13/2023 0415   RDW 12.9 01/13/2023 0415   RDW 13.7 10/10/2011 1407   LYMPHSABS 2.8 01/12/2023 1010   LYMPHSABS 2.2 10/10/2011 1407   MONOABS 1.3 (H) 01/12/2023 1010   MONOABS 0.7 10/10/2011 1407   EOSABS 0.2 01/12/2023 1010   EOSABS 0.6 10/10/2011 1407   BASOSABS 0.2 (H) 01/12/2023 1010   BASOSABS 0.3 (H) 10/10/2011 1407       Latest Ref Rng & Units 01/13/2023    7:46 AM 01/12/2023   10:10 AM 05/02/2022    5:43 AM  CMP  Glucose 70 - 99 mg/dL 829  562  130   BUN 6 - 20 mg/dL 20  23  10    Creatinine 0.61 - 1.24 mg/dL 8.65  7.84  6.96   Sodium 135 - 145 mmol/L 133  131  139   Potassium 3.5 - 5.1 mmol/L  3.8  3.2  4.9   Chloride 98 - 111 mmol/L 102  96  104   CO2 22 - 32 mmol/L 20  17  25    Calcium 8.9 - 10.3 mg/dL 8.7  9.5  8.6   Total Protein 6.5 - 8.1 g/dL 7.3     Total Bilirubin 0.3 - 1.2 mg/dL 0.9     Alkaline Phos 38 - 126 U/L 48     AST 15 - 41 U/L 67     ALT 0 - 44 U/L 69         Microbiology: Recent Results (from the past 240 hour(s))  Culture, blood (routine x 2)  Status: None (Preliminary result)   Collection Time: 01/12/23 11:17 AM   Specimen: BLOOD  Result Value Ref Range Status   Specimen Description BLOOD BLOOD RIGHT ARM  Final   Special Requests   Final    BOTTLES DRAWN AEROBIC AND ANAEROBIC Blood Culture results may not be optimal due to an excessive volume of blood received in culture bottles   Culture   Final    NO GROWTH < 24 HOURS Performed at [facility], [address], [place], [place] [address]    Report Status PENDING  Incomplete  Culture, blood (routine x 2)     Status: None (Preliminary result)   Collection Time: 01/12/23 11:18 AM   Specimen: BLOOD  Result Value Ref Range Status   Specimen Description BLOOD BLOOD RIGHT ARM  Final   Special Requests   Final    BOTTLES DRAWN AEROBIC AND ANAEROBIC Blood Culture results may not be optimal due to an excessive volume of blood received in culture bottles   Culture   Final    NO GROWTH < 24 HOURS Performed at [facility], [address] [place], [place] [address]    Report Status PENDING  Incomplete    IMAGING RESULTS: X-ray of the right foot possible defect of the distal portion of the first Distal phalanx suggestive of osteomyelitis. I have personally reviewed the films ? Impression/Recommendation Bilateral feet erythema and purpura and  rash suggestive of contact dermatitis to the wraps..  This does not pick a looking for cellulitis. May try topical steroids in a very small area to see whether it improves.  History of right fifth toe amputation in the  past  Superficial wound on the left fifth toe.  He does not know how he got it Seen by Dr. Marvis Moeller.  For just observation.  Patient is currently on Vanco and cefepime.   Diabetes mellitus -Peripheral neuropathy    Discussed the management with the patient in detail ___________________________________________________  Note:  This document was prepared using Dragon voice recognition software and may include unintentional dictation errors.

## 2023-01-13 NOTE — Progress Notes (Signed)
PROGRESS NOTE    SIRE POET  ZOX:[id number] DOB: 10/20/1975 DOA: 01/12/2023 PCP: Preston Fleeting, MD  Outpatient Specialists: podiatry    Brief Narrative:   Daniel Ritter is a 47 y.o. male with medical history significant of osteomyelitis status post fifth ray amputation, type 2 diabetes, peripheral neuropathy, hypertension, anxiety depression, seizure disorder presenting with right lower extremity wound.  Patient reports worsening right lower extremity redness and pain over the past 4 to 5 days.  Baseline history of prior right fifth ray amputation secondary to osteomyelitis.  Patient states that he had significant issues in the past secondary to heavy alcohol and tobacco use.  Patient states he quit smoking and drinking multiple years ago.  States has been trying to take better care of himself.  Tries to keep his blood sugars in 100s.  Patient states he accidentally struck his foot on the side of furniture.  Has had worsening pain since then.  No chest pain or shortness of breath.  No nausea or vomiting.  No fevers or chills.  Redness is worsened over the course of the past few days.    Assessment & Plan:   Principal Problem:   Wound of right lower extremity Active Problems:   Type 2 diabetes mellitus (HCC)   Essential hypertension   Seizures (HCC)   Anxiety and depression   History of CVA in adulthood  # Diabetic foot infection # Rash With possible osteo of distal tuft right first toe though clinically doesn't appear to have osteo at that site. Has rash now on bilateral feet that is palpable and non-blanching and for the most part follows the contours of wraps he places on his feet, I assume this is infectious in etiology. Has history osteomyelitis of the foot and is s/p right 5th ray amputation and left 2nd mtp excision. - continue ceftriaxone/metronidazole/vancomycin - ID and podiatry consulted - arterial dopplers as below  # PAD Hx of this, angiogram performed one  year ago (no intervention) - will repeat arterial dopplers  # T2DM Hx poor control though A1c here 7.7. yesterday bicarb low and gap elevated with normal vbg ph. Patient has been on gentle fluids since then and sliding scale. Feeling well - repeat labs this morning pending. If continue to show signs dka will need to be more aggressive with fluids and insulin - home metformin and januvia on hold  # Seizure disorder - cont home keppra  # HTN Here bp appropriate - cont home amlodipine, metop - home lisinopril/hydrochlorothiazide on hold  # Pain - cont home duloxetine  # GAD - cont home xanax   DVT prophylaxis: lovenox Code Status: full Family Communication: none @ bedside  Level of care: Med-Surg Status is: Observation     Consultants:  ID, podiatry  Procedures: pending  Antimicrobials:  Vanc/ceftriaxone/flagyl    Subjective: Reports new rash left foot  Objective: Vitals:   01/13/23 0000 01/13/23 0100 01/13/23 0233 01/13/23 0536  BP: 117/83 109/83 111/84 106/71  Pulse: 69 65 78 71  Resp:   14 16  Temp:   97.9 F (36.6 C) 98.2 F (36.8 C)  TempSrc:   Oral Oral  SpO2: 92% 95% 100% 92%  Weight:      Height:        Intake/Output Summary (Last 24 hours) at 01/13/2023 0831 Last data filed at 01/12/2023 2241 Gross per 24 hour  Intake 1599 ml  Output 400 ml  Net 1199 ml   Filed Weights   01/12/23  1007  Weight: 114.3 kg    Examination:  General exam: Appears calm and comfortable  Respiratory system: Clear to auscultation. Respiratory effort normal. Cardiovascular system: S1 & S2 heard, RRR. No JVD, murmurs, rubs, gallops or clicks. No pedal edema. Gastrointestinal system: Abdomen is nondistended, soft and nontender. No organomegaly or masses felt. Normal bowel sounds heard. Central nervous system: Alert and oriented. No focal neurological deficits. Extremities: no edema, distal lower extremity pulses not palpable Skin:      Psychiatry: Judgement  and insight appear normal. Mood & affect appropriate.     Data Reviewed: I have personally reviewed following labs and imaging studies  CBC: Recent Labs  Lab 01/12/23 1010 01/13/23 0415  WBC 14.4* 7.9  NEUTROABS 9.8*  --   HGB 15.8 11.9*  HCT 45.3 35.2*  MCV 91.7 94.4  PLT 125* 95*   Basic Metabolic Panel: Recent Labs  Lab 01/12/23 1010  NA 131*  K 3.2*  CL 96*  CO2 17*  GLUCOSE 295*  BUN 23*  CREATININE 1.36*  CALCIUM 9.5   GFR: Estimated Creatinine Clearance: 88 mL/min (A) (by C-G formula based on SCr of 1.36 mg/dL (H)). Liver Function Tests: No results for input(s): "AST", "ALT", "ALKPHOS", "BILITOT", "PROT", "ALBUMIN" in the last 168 hours. No results for input(s): "LIPASE", "AMYLASE" in the last 168 hours. No results for input(s): "AMMONIA" in the last 168 hours. Coagulation Profile: No results for input(s): "INR", "PROTIME" in the last 168 hours. Cardiac Enzymes: No results for input(s): "CKTOTAL", "CKMB", "CKMBINDEX", "TROPONINI" in the last 168 hours. BNP (last 3 results) No results for input(s): "PROBNP" in the last 8760 hours. HbA1C: Recent Labs    01/12/23 1546  HGBA1C 7.7*   CBG: Recent Labs  Lab 01/12/23 1659 01/12/23 2159 01/13/23 0810  GLUCAP 183* 132* 172*   Lipid Profile: No results for input(s): "CHOL", "HDL", "LDLCALC", "TRIG", "CHOLHDL", "LDLDIRECT" in the last 72 hours. Thyroid Function Tests: No results for input(s): "TSH", "T4TOTAL", "FREET4", "T3FREE", "THYROIDAB" in the last 72 hours. Anemia Panel: No results for input(s): "VITAMINB12", "FOLATE", "FERRITIN", "TIBC", "IRON", "RETICCTPCT" in the last 72 hours. Urine analysis:    Component Value Date/Time   COLORURINE COLORLESS (A) 05/01/2022 1405   APPEARANCEUR CLEAR (A) 05/01/2022 1405   APPEARANCEUR Clear 09/17/2011 1058   LABSPEC 1.002 (L) 05/01/2022 1405   LABSPEC 1.033 09/17/2011 1058   PHURINE 6.0 05/01/2022 1405   GLUCOSEU NEGATIVE 05/01/2022 1405   GLUCOSEU >=500  09/17/2011 1058   HGBUR NEGATIVE 05/01/2022 1405   BILIRUBINUR NEGATIVE 05/01/2022 1405   BILIRUBINUR Negative 09/17/2011 1058   KETONESUR NEGATIVE 05/01/2022 1405   PROTEINUR NEGATIVE 05/01/2022 1405   NITRITE NEGATIVE 05/01/2022 1405   LEUKOCYTESUR NEGATIVE 05/01/2022 1405   LEUKOCYTESUR Negative 09/17/2011 1058   Sepsis Labs: @LABRCNTIP (procalcitonin:4,lacticidven:4)  )No results found for this or any previous visit (from the past 240 hour(s)).       Radiology Studies: DG Foot Complete Right  Result Date: 01/12/2023 CLINICAL DATA:  Infection. EXAM: RIGHT FOOT COMPLETE - 3+ VIEW COMPARISON:  January 28, 2022. FINDINGS: Status post amputation of fifth metatarsal and phalanges. No acute fracture or dislocation is noted. Focal defect is seen involving the medial aspect of the distal portion of first distal phalanx suggesting osteomyelitis. Vascular calcifications are noted. IMPRESSION: Possible defect seen involving distal portion of first distal phalanx suggesting osteomyelitis. MRI may be performed for further evaluation. Electronically Signed   By: Lupita Raider M.D.   On: 01/12/2023 11:44   DG Ankle  Complete Right  Result Date: 01/12/2023 CLINICAL DATA:  Infection. EXAM: RIGHT ANKLE - COMPLETE 3+ VIEW COMPARISON:  None Available. FINDINGS: There is no evidence of fracture, dislocation, or joint effusion. There is no evidence of arthropathy or other focal bone abnormality. No lytic destruction is noted. Vascular calcifications are noted. IMPRESSION: No acute abnormality seen. Electronically Signed   By: Lupita Raider M.D.   On: 01/12/2023 11:41        Scheduled Meds:  ALPRAZolam  0.25 mg Oral BID   alteplase  2 mg Intracatheter Once   amLODipine  5 mg Oral Daily   DULoxetine  30 mg Oral Daily   enoxaparin (LOVENOX) injection  60 mg Subcutaneous Q24H   lisinopril  20 mg Oral Daily   And   hydrochlorothiazide  12.5 mg Oral Daily   insulin aspart  0-15 Units Subcutaneous TID WC    insulin aspart  0-5 Units Subcutaneous QHS   insulin aspart  3 Units Subcutaneous TID WC   levETIRAcetam  500 mg Oral BID   metoprolol succinate  100 mg Oral Daily   pantoprazole  40 mg Oral Daily   Continuous Infusions:  sodium chloride 50 mL/hr at 01/12/23 1713   cefTRIAXone (ROCEPHIN)  IV Stopped (01/12/23 2156)   metronidazole Stopped (01/12/23 2305)   vancomycin Stopped (01/13/23 0542)     LOS: 0 days     Silvano Bilis, MD [facility]   If 7PM-7AM, please contact night-coverage [email or web address] Password [facility] 01/13/2023, 8:31 AM

## 2023-01-13 NOTE — Consult Note (Signed)
Pharmacy Antibiotic Note  Daniel Ritter is a 47 y.o. male admitted on 01/12/2023 with cellulitis.  Pharmacy has been consulted for vancomycin dosing.   Scr 1.36 >> 0.84 (0.79 05/02/22)  Plan: Change vancomycin to 2000 mg IV every 12 hours Estimated AUC 461, Cmin 11.8 IBW, Scr 0.84, Vd coefficient 0.72 Vancomycin levels at steady state or as clinically indicated Ceftriaxone 2 grams IV every 24 hours per provider Flagyl 500 mg IV every 12 hours per provider  Height: 6\' 1"  (185.4 cm) Weight: 114.3 kg (252 lb) IBW/kg (Calculated) : 79.9  Temp (24hrs), Avg:98 F (36.7 C), Min:97.8 F (36.6 C), Max:98.2 F (36.8 C)  Recent Labs  Lab 01/12/23 1010 01/12/23 1117 01/12/23 1312 01/12/23 1546 01/13/23 0415 01/13/23 0746  WBC 14.4*  --   --   --  7.9  --   CREATININE 1.36*  --   --   --   --  0.84  LATICACIDVEN  --  3.2* 2.7* 2.2*  --  1.1     Estimated Creatinine Clearance: 142.5 mL/min (by C-G formula based on SCr of 0.84 mg/dL).    No Known Allergies  Antimicrobials this admission: Vancomycin 7/8 >>  ceftriaxone 7/8 >>  Flagyl 7/8 >> Zosyn 7/8 x 1  Dose adjustments this admission: Vancomycin 1250mg  q12h>>2000mg  q12h  Microbiology results: 7/8 BCx: ngtd   Thank you for allowing pharmacy to be a part of this patient's care.  Barrie Folk, PharmD 01/13/2023 10:23 AM

## 2023-01-14 DIAGNOSIS — E1100 Type 2 diabetes mellitus with hyperosmolarity without nonketotic hyperglycemic-hyperosmolar coma (NKHHC): Secondary | ICD-10-CM | POA: Diagnosis not present

## 2023-01-14 DIAGNOSIS — E1142 Type 2 diabetes mellitus with diabetic polyneuropathy: Secondary | ICD-10-CM

## 2023-01-14 DIAGNOSIS — I10 Essential (primary) hypertension: Secondary | ICD-10-CM | POA: Diagnosis not present

## 2023-01-14 DIAGNOSIS — Z89421 Acquired absence of other right toe(s): Secondary | ICD-10-CM | POA: Diagnosis not present

## 2023-01-14 DIAGNOSIS — R21 Rash and other nonspecific skin eruption: Secondary | ICD-10-CM | POA: Diagnosis not present

## 2023-01-14 DIAGNOSIS — I739 Peripheral vascular disease, unspecified: Secondary | ICD-10-CM

## 2023-01-14 DIAGNOSIS — L2489 Irritant contact dermatitis due to other agents: Secondary | ICD-10-CM

## 2023-01-14 DIAGNOSIS — L03115 Cellulitis of right lower limb: Secondary | ICD-10-CM

## 2023-01-14 DIAGNOSIS — L539 Erythematous condition, unspecified: Secondary | ICD-10-CM | POA: Diagnosis not present

## 2023-01-14 DIAGNOSIS — I776 Arteritis, unspecified: Secondary | ICD-10-CM

## 2023-01-14 DIAGNOSIS — D692 Other nonthrombocytopenic purpura: Secondary | ICD-10-CM

## 2023-01-14 DIAGNOSIS — S91105D Unspecified open wound of left lesser toe(s) without damage to nail, subsequent encounter: Secondary | ICD-10-CM | POA: Diagnosis not present

## 2023-01-14 LAB — CBC
HCT: 38.3 % — ABNORMAL LOW (ref 39.0–52.0)
Hemoglobin: 13.5 g/dL (ref 13.0–17.0)
MCH: 32.6 pg (ref 26.0–34.0)
MCHC: 35.2 g/dL (ref 30.0–36.0)
MCV: 92.5 fL (ref 80.0–100.0)
Platelets: 113 10*3/uL — ABNORMAL LOW (ref 150–400)
RBC: 4.14 MIL/uL — ABNORMAL LOW (ref 4.22–5.81)
RDW: 12.7 % (ref 11.5–15.5)
WBC: 7.1 10*3/uL (ref 4.0–10.5)
nRBC: 0 % (ref 0.0–0.2)

## 2023-01-14 LAB — BASIC METABOLIC PANEL
Anion gap: 9 (ref 5–15)
BUN: 17 mg/dL (ref 6–20)
CO2: 20 mmol/L — ABNORMAL LOW (ref 22–32)
Calcium: 9.1 mg/dL (ref 8.9–10.3)
Chloride: 104 mmol/L (ref 98–111)
Creatinine, Ser: 0.59 mg/dL — ABNORMAL LOW (ref 0.61–1.24)
GFR, Estimated: >60 mL/min (ref >60)
Glucose, Bld: 165 mg/dL — ABNORMAL HIGH (ref 70–99)
Potassium: 3.7 mmol/L (ref 3.5–5.1)
Sodium: 133 mmol/L — ABNORMAL LOW (ref 135–145)

## 2023-01-14 LAB — GLUCOSE, CAPILLARY
Glucose-Capillary: 159 mg/dL — ABNORMAL HIGH (ref 70–99)
Glucose-Capillary: 139 mg/dL — ABNORMAL HIGH (ref 70–99)
Glucose-Capillary: 152 mg/dL — ABNORMAL HIGH (ref 70–99)
Glucose-Capillary: 120 mg/dL — ABNORMAL HIGH (ref 70–99)

## 2023-01-14 MED ORDER — METFORMIN HCL 500 MG PO TABS
1000.0000 mg | ORAL_TABLET | Freq: Two times a day (BID) | ORAL | Status: DC
  Administered 2023-01-14 – 2023-01-15 (×2): 1000 mg via ORAL
  Filled 2023-01-14 (×2): qty 2

## 2023-01-14 MED ORDER — TRIAMCINOLONE ACETONIDE 0.5 % EX CREA
TOPICAL_CREAM | Freq: Two times a day (BID) | CUTANEOUS | Status: DC
  Filled 2023-01-14: qty 15

## 2023-01-14 MED ORDER — LINAGLIPTIN 5 MG PO TABS
5.0000 mg | ORAL_TABLET | Freq: Every day | ORAL | Status: DC
  Administered 2023-01-14 – 2023-01-15 (×2): 5 mg via ORAL
  Filled 2023-01-14 (×2): qty 1

## 2023-01-14 MED ORDER — DIPHENHYDRAMINE HCL 25 MG PO CAPS
25.0000 mg | ORAL_CAPSULE | Freq: Three times a day (TID) | ORAL | Status: DC | PRN
  Administered 2023-01-14: 25 mg via ORAL
  Filled 2023-01-14: qty 1

## 2023-01-14 MED ORDER — CEFAZOLIN SODIUM-DEXTROSE 2-4 GM/100ML-% IV SOLN
2.0000 g | Freq: Three times a day (TID) | INTRAVENOUS | Status: DC
  Administered 2023-01-14 – 2023-01-15 (×3): 2 g via INTRAVENOUS
  Filled 2023-01-14 (×3): qty 100

## 2023-01-14 NOTE — Plan of Care (Signed)

## 2023-01-14 NOTE — Progress Notes (Addendum)
Date of Admission:  01/12/2023    ID: Daniel Ritter is a 47 y.o. male  Principal Problem:   Wound of right lower extremity Active Problems:   Type 2 diabetes mellitus (HCC)   Essential hypertension   Seizures (HCC)   Anxiety and depression   History of CVA in adulthood   PAD (peripheral artery disease) (HCC)    Subjective: Pt is feeling better  Medications:   ALPRAZolam  0.25 mg Oral BID   amLODipine  5 mg Oral Daily   vitamin C  500 mg Oral BID   DULoxetine  30 mg Oral Daily   enoxaparin (LOVENOX) injection  60 mg Subcutaneous Q24H   lisinopril  20 mg Oral Daily   And   hydrochlorothiazide  12.5 mg Oral Daily   insulin aspart  0-15 Units Subcutaneous TID WC   insulin aspart  0-5 Units Subcutaneous QHS   insulin aspart  3 Units Subcutaneous TID WC   levETIRAcetam  500 mg Oral BID   metoprolol succinate  100 mg Oral Daily   multivitamin with minerals  1 tablet Oral Daily   pantoprazole  40 mg Oral Daily   Ensure Max Protein  11 oz Oral QHS   triamcinolone cream   Topical BID   zinc sulfate  220 mg Oral Daily    Objective: Vital signs in last 24 hours: Patient Vitals for the past 24 hrs:  BP Temp Temp src Pulse Resp SpO2  01/14/23 0737 126/88 97.8 F (36.6 C) -- 78 14 97 %  01/13/23 2343 110/86 97.6 F (36.4 C) -- 73 20 98 %  01/13/23 1440 123/84 98 F (36.7 C) Oral 84 16 99 %      PHYSICAL EXAM:  General: Alert, cooperative, no distress, appears stated age.  Head: Normocephalic, without obvious abnormality, atraumatic. Eyes: Conjunctivae clear, anicteric sclerae. Pupils are equal ENT Nares normal. No drainage or sinus tenderness. Lips, mucosa, and tongue normal. No Thrush Neck: Supple, symmetrical, no adenopathy, thyroid: non tender no carotid bruit and no JVD. Back: No CVA tenderness. Lungs: Clear to auscultation bilaterally. No Wheezing or Rhonchi. No rales. Heart: Regular rate and rhythm, no murmur, rub or gallop. Abdomen: Soft, non-tender,not  distended. Bowel sounds normal. No masses Extremities:  B/l foot- purpuric vasculitis rash     Skin: No rashes or lesions. Or bruising Lymph: Cervical, supraclavicular normal. Neurologic: Grossly non-focal  Lab Results    Latest Ref Rng & Units 01/14/2023    4:16 AM 01/13/2023    4:15 AM 01/12/2023   10:10 AM  CBC  WBC 4.0 - 10.5 K/uL 7.1  7.9  14.4   Hemoglobin 13.0 - 17.0 g/dL 16.1  09.6  04.5   Hematocrit 39.0 - 52.0 % 38.3  35.2  45.3   Platelets 150 - 400 K/uL 113  95  125        Latest Ref Rng & Units 01/14/2023    4:16 AM 01/13/2023    7:46 AM 01/12/2023   10:10 AM  CMP  Glucose 70 - 99 mg/dL 409  811  914   BUN 6 - 20 mg/dL 17  20  23    Creatinine 0.61 - 1.24 mg/dL 7.82  9.56  2.13   Sodium 135 - 145 mmol/L 133  133  131   Potassium 3.5 - 5.1 mmol/L 3.7  3.8  3.2   Chloride 98 - 111 mmol/L 104  102  96   CO2 22 - 32 mmol/L 20  20  17   Calcium 8.9 - 10.3 mg/dL 9.1  8.7  9.5   Total Protein 6.5 - 8.1 g/dL  7.3    Total Bilirubin 0.3 - 1.2 mg/dL  0.9    Alkaline Phos 38 - 126 U/L  48    AST 15 - 41 U/L  67    ALT 0 - 44 U/L  69        Microbiology:  Studies/Results: DG Foot Complete Left  Result Date: 01/13/2023 CLINICAL DATA:  Ulcerated, foot. Ulcer on lateral portion of left foot. EXAM: LEFT FOOT - COMPLETE 3+ VIEW COMPARISON:  Left foot radiographs 01/29/2022 FINDINGS: There is new bone loss at the distal aspect of second metatarsal and proximal aspect of the adjacent proximal phalanx in the region of the plantar ulcer seen on the prior 01/29/2022 radiographs. There is sclerosis at the edges of the bone loss. Mild hallux valgus. No acute fracture dislocation. No definitive soft tissue ulceration is identified. IMPRESSION: Compared to 01/29/2022: New bone loss at the distal aspect of second metatarsal and proximal aspect of the adjacent proximal phalanx in the region of the plantar ulcer seen on the prior 01/29/2022 radiographs. This does not appear to represent  interval postsurgical change given the mildly irregular margins. 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 Recommend clinical correlation for the location of the reported ulcer. Electronically Signed   By: Neita Garnet M.D.   On: 01/13/2023 14:02   US ARTERIAL LOWER EXTREMITY DUPLEX BILATERAL  Result Date: 01/13/2023 CLINICAL DATA:  Osteomyelitis EXAM: BILATERAL LOWER EXTREMITY ARTERIAL DUPLEX SCAN TECHNIQUE: Gray-scale sonography as well as color Doppler and duplex ultrasound was performed to evaluate the arteries of both lower extremities including the common, superficial and profunda femoral arteries, popliteal artery and calf arteries. COMPARISON:  None available FINDINGS: Right Lower Extremity Inflow: Normal common femoral arterial waveforms and velocities. No evidence of inflow (aortoiliac) disease. Outflow: Normal profunda femoral, superficial femoral and popliteal arterial waveforms and velocities. No focal elevation of the PSV to suggest stenosis. Runoff: Normal posterior and anterior tibial arterial waveforms and velocities. Vessels are patent to the ankle. Left Lower Extremity Inflow: Normal common femoral arterial waveforms and velocities. No evidence of inflow (aortoiliac) disease. Outflow: Normal profunda femoral, superficial femoral and popliteal arterial waveforms and velocities. No focal elevation of the PSV to suggest stenosis. Runoff: Normal posterior and anterior tibial arterial waveforms and velocities. Vessels are patent to the ankle. IMPRESSION: 1. Diffusely calcified lower extremity arteries. 2. No Doppler evidence of significant stenosis of lower extremity arteries. Electronically Signed   By: Acquanetta Belling M.D.   On: 01/13/2023 11:51     Assessment/Plan: Bilateral feet erythema and purpura and  rash suggestive of contact dermatitis to the wraps..VS vasculitis   This is not typical for cellulitis. May try topical steroids in a very small area  to see whether it improves.   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  He does not know how he got it Seen by Dr. Marvis Moeller.  For just observation.  Patient is currently on Vanco and cefepime. Dc both and change to cefazolin   Diabetes mellitus -Peripheral neuropathy    Discussed the management with the patient and hospitlaist

## 2023-01-14 NOTE — TOC Progression Note (Signed)
Transition of Care [facility]) - Progression Note    Patient Details  Name: Daniel Ritter MRN: [id number] Date of Birth: [birth year]  Transition of Care [facility]) CM/SW Contact  Marlowe Sax, RN Phone Number: 01/14/2023, 9:10 AM  Clinical Narrative:     Transition of Care [facility]) - Inpatient Brief Assessment   Patient Details  Name: Daniel Ritter MRN: [id number] Date of Birth: [birth year]  Transition of Care [facility]) CM/SW Contact:    Marlowe Sax, RN Phone Number: 01/14/2023, 9:10 AM   Clinical Narrative:  Per podiatry consult, plan to monitor lt fifth metatarsal ulcer (vascular evaluation revealed good circulation) will be monitored peripherally. Suspect bilateral lower extremity rash is dermatological in nature; possibly contact dermatitis vs vasculitis.   Transition of Care Asessment: Insurance and Status: Insurance coverage has been reviewed Patient has primary care physician: Yes (Revelo, Presley Raddle, MD)     Prior/Current Home Services: No current home services Social Determinants of Health Reivew: SDOH reviewed no interventions necessary Readmission risk has been reviewed: Yes Transition of care needs: no transition of care needs at this time        Expected Discharge Plan and Services                                               Social Determinants of Health (SDOH) Interventions SDOH Screenings   Food Insecurity: No Food Insecurity (01/13/2023)  Housing: Low Risk  (01/13/2023)  Transportation Needs: No Transportation Needs (01/13/2023)  Utilities: Not At Risk (01/13/2023)  Depression (PHQ2-9): Low Risk  (03/18/2022)  Tobacco Use: Medium Risk (01/13/2023)    Readmission Risk Interventions    01/14/2023    9:09 AM  Readmission Risk Prevention Plan  Post Dischage Appt Complete  Medication Screening Complete  Transportation Screening Complete

## 2023-01-14 NOTE — Progress Notes (Signed)
[facility]  - [place] [facility]   PATIENT NAME: Daniel Ritter    MR#:  [id number]  DATE OF BIRTH:  1976/01/07  SUBJECTIVE:  patient's mother at bedside. Overall he seems to feel leg rash improving. He is itching in his back and is very eager to take shower.   VITALS:  Blood pressure 112/73, pulse 70, temperature 97.9 F (36.6 C), resp. rate 14, height 6\' 1"  (1.854 m), weight 114.3 kg, SpO2 96 %.  PHYSICAL EXAMINATION:   GENERAL:  47 y.o.-year-old patient with no acute distress.  LUNGS: Normal breath sounds bilaterally, no wheezing CARDIOVASCULAR: S1, S2 normal. No murmur   ABDOMEN: Soft, nontender, nondistended. Bowel sounds present.  EXTREMITIES:        NEUROLOGIC: nonfocal  patient is alert and awake   LABORATORY PANEL:  CBC Recent Labs  Lab 01/14/23 0416  WBC 7.1  HGB 13.5  HCT 38.3*  PLT 113*    Chemistries  Recent Labs  Lab 01/13/23 0746 01/14/23 0416  NA 133* 133*  K 3.8 3.7  CL 102 104  CO2 20* 20*  GLUCOSE 161* 165*  BUN 20 17  CREATININE 0.84 0.59*  CALCIUM 8.7* 9.1  AST 67*  --   ALT 69*  --   ALKPHOS 48  --   BILITOT 0.9  --    Cardiac Enzymes No results for input(s): "TROPONINI" in the last 168 hours. RADIOLOGY:  DG Foot Complete Left  Result Date: 01/13/2023 CLINICAL DATA:  Ulcerated, foot. Ulcer on lateral portion of left foot. EXAM: LEFT FOOT - COMPLETE 3+ VIEW COMPARISON:  Left foot radiographs 01/29/2022 FINDINGS: There is new bone loss at the distal aspect of second metatarsal and proximal aspect of the adjacent proximal phalanx in the region of the plantar ulcer seen on the prior 01/29/2022 radiographs. There is sclerosis at the edges of the bone loss. Mild hallux valgus. No acute fracture dislocation. No definitive soft tissue ulceration is identified. IMPRESSION: Compared to 01/29/2022: New bone loss at the distal aspect of second metatarsal and proximal aspect of the adjacent proximal phalanx in the region of the  plantar ulcer seen on the prior 01/29/2022 radiographs. This does not appear to represent interval postsurgical change given the mildly irregular margins. 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 Recommend clinical correlation for the location of the reported ulcer. Electronically Signed   By: Neita Garnet M.D.   On: 01/13/2023 14:02   US ARTERIAL LOWER EXTREMITY DUPLEX BILATERAL  Result Date: 01/13/2023 CLINICAL DATA:  Osteomyelitis EXAM: BILATERAL LOWER EXTREMITY ARTERIAL DUPLEX SCAN TECHNIQUE: Gray-scale sonography as well as color Doppler and duplex ultrasound was performed to evaluate the arteries of both lower extremities including the common, superficial and profunda femoral arteries, popliteal artery and calf arteries. COMPARISON:  None available FINDINGS: Right Lower Extremity Inflow: Normal common femoral arterial waveforms and velocities. No evidence of inflow (aortoiliac) disease. Outflow: Normal profunda femoral, superficial femoral and popliteal arterial waveforms and velocities. No focal elevation of the PSV to suggest stenosis. Runoff: Normal posterior and anterior tibial arterial waveforms and velocities. Vessels are patent to the ankle. Left Lower Extremity Inflow: Normal common femoral arterial waveforms and velocities. No evidence of inflow (aortoiliac) disease. Outflow: Normal profunda femoral, superficial femoral and popliteal arterial waveforms and velocities. No focal elevation of the PSV to suggest stenosis. Runoff: Normal posterior and anterior tibial arterial waveforms and velocities. Vessels are patent to the ankle. IMPRESSION: 1. Diffusely calcified lower extremity arteries. 2. No  Doppler evidence of significant stenosis of lower extremity arteries. Electronically Signed   By: Acquanetta Belling M.D.   On: 01/13/2023 11:51    Assessment and Plan  Daniel Ritter is a 47 y.o. male with medical history significant of osteomyelitis status post  fifth ray amputation, type 2 diabetes, peripheral neuropathy, hypertension, anxiety depression, seizure disorder presenting with right lower extremity wound.  Patient reports worsening right lower extremity redness and pain over the past 4 to 5 days.  Baseline history of prior right fifth ray amputation secondary to osteomyelitis. Patient states he accidentally struck his foot on the side of furniture. Has had worsening pain since then. No chest pain or shortness of breath. Redness is worsened over the course of the past few days.   # Diabetic foot infection # Rash --With possible osteo of distal tuft right first toe though clinically doesn't appear to have osteo at that site. Has rash now on bilateral feet that is palpable and non-blanching and for the most part follows the contours of wraps he places on his feet could be contact dermatitis Has history osteomyelitis of the foot and is s/p right 5th ray amputation and left 2nd mtp excision. - ID and podiatry consulted --Changed to IV cefazolin - arterial dopplers as below --trial of kenalog cream   # PAD Hx of this, angiogram performed one year ago (no intervention) - ABI shows diffuse calcified lower extremity arteries. No Doppler evidence of significant stenosis over lower extremity artery   # T2DM Hx poor control though A1c here 7.7.  -- Resume home meds Januvia and metformin  # Seizure disorder - cont home keppra   # HTN - cont home amlodipine, metop - home lisinopril/hydrochlorothiazide on hold   # GAD - cont home xanax, duloxetine     DVT prophylaxis: lovenox Code Status: full Family Communication: mother @ bedside Consults : ID, podiatry Level of care: Med-Surg Status is: Inpatient Remains inpatient appropriate because: rash    TOTAL TIME TAKING CARE OF THIS PATIENT: 35 minutes.  >50% time spent on counselling and coordination of care  Note: This dictation was prepared with Dragon dictation along with smaller phrase  technology. Any transcriptional errors that result from this process are unintentional.  Enedina Finner M.D    [facility]   CC: Primary care physician; Neita Goodnight, Presley Raddle, MD

## 2023-01-15 DIAGNOSIS — I739 Peripheral vascular disease, unspecified: Secondary | ICD-10-CM | POA: Diagnosis not present

## 2023-01-15 DIAGNOSIS — L539 Erythematous condition, unspecified: Secondary | ICD-10-CM | POA: Diagnosis not present

## 2023-01-15 DIAGNOSIS — L03115 Cellulitis of right lower limb: Secondary | ICD-10-CM | POA: Diagnosis not present

## 2023-01-15 DIAGNOSIS — S91105D Unspecified open wound of left lesser toe(s) without damage to nail, subsequent encounter: Secondary | ICD-10-CM | POA: Diagnosis not present

## 2023-01-15 DIAGNOSIS — R21 Rash and other nonspecific skin eruption: Secondary | ICD-10-CM | POA: Diagnosis not present

## 2023-01-15 DIAGNOSIS — Z89421 Acquired absence of other right toe(s): Secondary | ICD-10-CM | POA: Diagnosis not present

## 2023-01-15 DIAGNOSIS — L2489 Irritant contact dermatitis due to other agents: Secondary | ICD-10-CM

## 2023-01-15 DIAGNOSIS — E1100 Type 2 diabetes mellitus with hyperosmolarity without nonketotic hyperglycemic-hyperosmolar coma (NKHHC): Secondary | ICD-10-CM | POA: Diagnosis not present

## 2023-01-15 LAB — GLUCOSE, CAPILLARY
Glucose-Capillary: 151 mg/dL — ABNORMAL HIGH (ref 70–99)
Glucose-Capillary: 120 mg/dL — ABNORMAL HIGH (ref 70–99)

## 2023-01-15 MED ORDER — CEFADROXIL 500 MG PO CAPS: 500.0000 mg | ORAL_CAPSULE | Freq: Two times a day (BID) | ORAL | 0 refills | Status: DC

## 2023-01-15 MED ORDER — ZINC SULFATE 220 (50 ZN) MG PO CAPS: 220.0000 mg | ORAL_CAPSULE | Freq: Every day | ORAL | 0 refills | Status: DC

## 2023-01-15 MED ORDER — ASCORBIC ACID 500 MG PO TABS: 500.0000 mg | ORAL_TABLET | Freq: Two times a day (BID) | ORAL | 0 refills | Status: DC

## 2023-01-15 MED ORDER — TRIAMCINOLONE ACETONIDE 0.5 % EX CREA: TOPICAL_CREAM | Freq: Two times a day (BID) | CUTANEOUS | 0 refills | Status: DC

## 2023-01-15 NOTE — Progress Notes (Signed)
Date of Admission:  01/12/2023    ID: Daniel Ritter is a 47 y.o. male  Principal Problem:   Wound of right lower extremity Active Problems:   Type 2 diabetes mellitus (HCC)   Essential hypertension   Seizures (HCC)   Anxiety and depression   History of CVA in adulthood   PAD (peripheral artery disease) (HCC)   Cellulitis of right lower extremity   Irritant contact dermatitis due to other agents   Vasculitis (HCC)    Subjective: Pt says he is feeling better with kenalog cream topically Says it is not red  Medications:   ALPRAZolam  0.25 mg Oral BID   amLODipine  5 mg Oral Daily   vitamin C  500 mg Oral BID   DULoxetine  30 mg Oral Daily   enoxaparin (LOVENOX) injection  60 mg Subcutaneous Q24H   lisinopril  20 mg Oral Daily   And   hydrochlorothiazide  12.5 mg Oral Daily   insulin aspart  0-15 Units Subcutaneous TID WC   insulin aspart  0-5 Units Subcutaneous QHS   insulin aspart  3 Units Subcutaneous TID WC   levETIRAcetam  500 mg Oral BID   linagliptin  5 mg Oral Daily   metFORMIN  1,000 mg Oral BID WC   metoprolol succinate  100 mg Oral Daily   multivitamin with minerals  1 tablet Oral Daily   pantoprazole  40 mg Oral Daily   Ensure Max Protein  11 oz Oral QHS   triamcinolone cream   Topical BID   zinc sulfate  220 mg Oral Daily    Objective: Vital signs in last 24 hours: Patient Vitals for the past 24 hrs:  BP Temp Temp src Pulse Resp SpO2  01/15/23 0803 120/82 97.9 F (36.6 C) -- 70 17 99 %  01/14/23 2345 116/84 97.8 F (36.6 C) Oral 74 16 98 %  01/14/23 1523 112/73 97.9 F (36.6 C) -- 70 14 96 %      PHYSICAL EXAM:  General: Alert, cooperative, no distress, appears stated age.  Lungs: Clear to auscultation bilaterally. No Wheezing or Rhonchi. No rales. Heart: Regular rate and rhythm, no murmur, rub or gallop. Abdomen: Soft, non-tender,not distended. Bowel sounds normal. No masses Extremities:  B/l foot- purpuric vasculitis  rash 7/11   7/10     Skin: No rashes or lesions. Or bruising Lymph: Cervical, supraclavicular normal. Neurologic: Grossly non-focal  Lab Results    Latest Ref Rng & Units 01/14/2023    4:16 AM 01/13/2023    4:15 AM 01/12/2023   10:10 AM  CBC  WBC 4.0 - 10.5 K/uL 7.1  7.9  14.4   Hemoglobin 13.0 - 17.0 g/dL 40.9  81.1  91.4   Hematocrit 39.0 - 52.0 % 38.3  35.2  45.3   Platelets 150 - 400 K/uL 113  95  125        Latest Ref Rng & Units 01/14/2023    4:16 AM 01/13/2023    7:46 AM 01/12/2023   10:10 AM  CMP  Glucose 70 - 99 mg/dL 782  956  213   BUN 6 - 20 mg/dL 17  20  23    Creatinine 0.61 - 1.24 mg/dL 0.86  5.78  4.69   Sodium 135 - 145 mmol/L 133  133  131   Potassium 3.5 - 5.1 mmol/L 3.7  3.8  3.2   Chloride 98 - 111 mmol/L 104  102  96   CO2 22 -  32 mmol/L 20  20  17    Calcium 8.9 - 10.3 mg/dL 9.1  8.7  9.5   Total Protein 6.5 - 8.1 g/dL  7.3    Total Bilirubin 0.3 - 1.2 mg/dL  0.9    Alkaline Phos 38 - 126 U/L  48    AST 15 - 41 U/L  67    ALT 0 - 44 U/L  69        Microbiology: Bc NG Studies/Results: DG Foot Complete Left  Result Date: 01/13/2023 CLINICAL DATA:  Ulcerated, foot. Ulcer on lateral portion of left foot. EXAM: LEFT FOOT - COMPLETE 3+ VIEW COMPARISON:  Left foot radiographs 01/29/2022 FINDINGS: There is new bone loss at the distal aspect of second metatarsal and proximal aspect of the adjacent proximal phalanx in the region of the plantar ulcer seen on the prior 01/29/2022 radiographs. There is sclerosis at the edges of the bone loss. Mild hallux valgus. No acute fracture dislocation. No definitive soft tissue ulceration is identified. IMPRESSION: Compared to 01/29/2022: New bone loss at the distal aspect of second metatarsal and proximal aspect of the adjacent proximal phalanx in the region of the plantar ulcer seen on the prior 01/29/2022 radiographs. This does not appear to represent interval postsurgical change given the mildly irregular margins. Findings  are concerning for age indeterminate osteomyelitis, new from 01/29/2022. Recommend clinical correlation for the location of the reported ulcer. Electronically Signed   By: Neita Garnet M.D.   On: 01/13/2023 14:02   US ARTERIAL LOWER EXTREMITY DUPLEX BILATERAL  Result Date: 01/13/2023 CLINICAL DATA:  Osteomyelitis EXAM: BILATERAL LOWER EXTREMITY ARTERIAL DUPLEX SCAN TECHNIQUE: Gray-scale sonography as well as color Doppler and duplex ultrasound was performed to evaluate the arteries of both lower extremities including the common, superficial and profunda femoral arteries, popliteal artery and calf arteries. COMPARISON:  None available FINDINGS: Right Lower Extremity Inflow: Normal common femoral arterial waveforms and velocities. No evidence of inflow (aortoiliac) disease. Outflow: Normal profunda femoral, superficial femoral and popliteal arterial waveforms and velocities. No focal elevation of the PSV to suggest stenosis. Runoff: Normal posterior and anterior tibial arterial waveforms and velocities. Vessels are patent to the ankle. Left Lower Extremity Inflow: Normal common femoral arterial waveforms and velocities. No evidence of inflow (aortoiliac) disease. Outflow: Normal profunda femoral, superficial femoral and popliteal arterial waveforms and velocities. No focal elevation of the PSV to suggest stenosis. Runoff: Normal posterior and anterior tibial arterial waveforms and velocities. Vessels are patent to the ankle. IMPRESSION: 1. Diffusely calcified lower extremity arteries. 2. No Doppler evidence of significant stenosis of lower extremity arteries. Electronically Signed   By: Acquanetta Belling M.D.   On: 01/13/2023 11:51     Assessment/Plan: Bilateral feet erythema and purpura and  rash suggestive of contact dermatitis to the wraps..VS vasculitis   This is not typical for cellulitis. 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  He does not know how he got it Seen by Dr. Marvis Moeller.  For just observation.  Patient is currently on Vanco and cefepime. Dc both and change to cefazolin   Diabetes mellitus -Peripheral neuropathy    Discussed the management with the patient and hospitlaist

## 2023-01-15 NOTE — Discharge Summary (Signed)
Physician Discharge Summary   Patient: Daniel Ritter MRN: [id number] DOB: 1976-03-19  Admit date:     01/12/2023  Discharge date: 01/15/23  Discharge Physician: Enedina Finner   PCP: Preston Fleeting, MD   Recommendations at discharge:    F/u [facility] in 1-2 weeks or when first appt available F/u PCP in 1 week  Discharge Diagnoses: Principal Problem:   Wound of right lower extremity Active Problems:   Type 2 diabetes mellitus (HCC)   Essential hypertension   Seizures (HCC)   Anxiety and depression   History of CVA in adulthood   PAD (peripheral artery disease) (HCC)   Cellulitis of right lower extremity   Irritant contact dermatitis due to other agents   Vasculitis (HCC) Daniel Ritter is a 47 y.o. male with medical history significant of osteomyelitis status post fifth ray amputation, type 2 diabetes, peripheral neuropathy, hypertension, anxiety depression, seizure disorder presenting with right lower extremity wound.  Patient reports worsening right lower extremity redness and pain over the past 4 to 5 days.  Baseline history of prior right fifth ray amputation secondary to osteomyelitis. Patient states he accidentally struck his foot on the side of furniture. Has had worsening pain since then. No chest pain or shortness of breath. Redness is worsened over the course of the past few days.    # Rash bilateral LE/foot ?contact dermatitis --With possible osteo of distal tuft right first toe though clinically doesn't appear to have osteo at that site. Has rash now on bilateral feet that is palpable and non-blanching and for the most part follows the contours of wraps he places on his feet could be contact dermatitis Has history osteomyelitis of the foot and is s/p right 5th ray amputation and left 2nd mtp excision. - ID and podiatry consulted --Changed to IV cefazolin--change to po cefadroxil for 7 days per ID - arterial dopplers as below --trial of kenalog  cream --Referral sent to Dr [facility] for pt to be seen   # PAD Hx of this, angiogram performed one year ago (no intervention) - ABI shows diffuse calcified lower extremity arteries. No Doppler evidence of significant stenosis over lower extremity artery   # T2DM Hx poor control though A1c here 7.7.  -- Resume home meds Januvia and metformin   # Seizure disorder - cont home keppra   # HTN - cont home amlodipine, metop - home lisinopril/hydrochlorothiazide on hold   # GAD - cont home xanax, duloxetine   patient feeling better. He is eager to go home. I have asked patient to reach out to [facility] dermatology if he does not hear from them to set up an appointment for evaluation. Patient voice understanding.   DVT prophylaxis: lovenox Code Status: full Family Communication:  none today     Pain control - [place] Controlled Substance Reporting System database was reviewed. and patient was instructed, not to drive, operate heavy machinery, perform activities at heights, swimming or participation in water activities or provide baby-sitting services while on Pain, Sleep and Anxiety Medications; until their outpatient Physician has advised to do so again. Also recommended to not to take more than prescribed Pain, Sleep and Anxiety Medications.   Disposition: Home Diet recommendation:  Cardiac diet DISCHARGE MEDICATION: Allergies as of 01/15/2023   No Known Allergies      Medication List     STOP taking these medications    ciprofloxacin 500 MG tablet Commonly known as: CIPRO  TAKE these medications    acetaminophen 325 MG tablet Commonly known as: TYLENOL Take 2 tablets (650 mg total) by mouth every 6 (six) hours as needed for mild pain (or Fever >/= 101).   ALPRAZolam 0.25 MG tablet Commonly known as: XANAX Take 0.25 mg by mouth 2 (two) times daily.   amLODipine 10 MG tablet Commonly known as: NORVASC Take 10 mg by mouth daily as  needed.   ascorbic acid 500 MG tablet Commonly known as: VITAMIN C Take 1 tablet (500 mg total) by mouth 2 (two) times daily.   cefadroxil 500 MG capsule Commonly known as: DURICEF Take 1 capsule (500 mg total) by mouth 2 (two) times daily for 7 days.   chlorhexidine 0.12 % solution Commonly known as: PERIDEX Use as directed 15 mLs in the mouth or throat 2 (two) times daily.   DULoxetine 30 MG capsule Commonly known as: CYMBALTA Take 30 mg by mouth daily.   Januvia 100 MG tablet Generic drug: sitaGLIPtin Take 100 mg by mouth daily.   levETIRAcetam 500 MG tablet Commonly known as: KEPPRA Take 1 tablet (500 mg total) by mouth 2 (two) times daily.   lisinopril-hydrochlorothiazide 20-25 MG tablet Commonly known as: ZESTORETIC Take 1 tablet by mouth daily. What changed: Another medication with the same name was removed. Continue taking this medication, and follow the directions you see here.   metFORMIN 1000 MG tablet Commonly known as: GLUCOPHAGE Take 1,000 mg by mouth 2 (two) times daily with a meal.   metoprolol succinate 100 MG 24 hr tablet Commonly known as: TOPROL-XL Take 100 mg by mouth daily. Take with or immediately following a meal.   multivitamin with minerals tablet Take 1 tablet by mouth daily.   omeprazole 20 MG capsule Commonly known as: PRILOSEC Take 20 mg by mouth 2 (two) times daily before a meal.   triamcinolone cream 0.5 % Commonly known as: KENALOG Apply topically 2 (two) times daily.   zinc sulfate 220 (50 Zn) MG capsule Take 1 capsule (220 mg total) by mouth daily. Start taking on: January 16, 2023        Follow-up Information     [facility], [place]. Schedule an appointment as soon as possible for a visit.   Why: [facility] will call you as soon as they recieve the referral. I would recommend to continue to call them just to check on the status of your referral. Contact information: [address] [place]  [address] [phone]         Revelo, Presley Raddle, MD. Schedule an appointment as soon as possible for a visit in 1 week(s).   Specialty: Family Medicine Why: hospital f/u Contact information: [address] [place] [address] [phone]                Discharge Exam: Ceasar Mons Weights   01/12/23 1007  Weight: 114.3 kg    On 01/15/2023       Condition at discharge: fair  The results of significant diagnostics from this hospitalization (including imaging, microbiology, ancillary and laboratory) are listed below for reference.   Imaging Studies: DG Foot Complete Left  Result Date: 01/13/2023 CLINICAL DATA:  Ulcerated, foot. Ulcer on lateral portion of left foot. EXAM: LEFT FOOT - COMPLETE 3+ VIEW COMPARISON:  Left foot radiographs 01/29/2022 FINDINGS: There is new bone loss at the distal aspect of second metatarsal and proximal aspect of the adjacent proximal phalanx in the region of the plantar ulcer seen on the  prior 01/29/2022 radiographs. There is sclerosis at the edges of the bone loss. Mild hallux valgus. No acute fracture dislocation. No definitive soft tissue ulceration is identified. IMPRESSION: Compared to 01/29/2022: New bone loss at the distal aspect of second metatarsal and proximal aspect of the adjacent proximal phalanx in the region of the plantar ulcer seen on the prior 01/29/2022 radiographs. This does not appear to represent interval postsurgical change given the mildly irregular margins. 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 Recommend clinical correlation for the location of the reported ulcer. Electronically Signed   By: Neita Garnet M.D.   On: 01/13/2023 14:02   US ARTERIAL LOWER EXTREMITY DUPLEX BILATERAL  Result Date: 01/13/2023 CLINICAL DATA:  Osteomyelitis EXAM: BILATERAL LOWER EXTREMITY ARTERIAL DUPLEX SCAN TECHNIQUE: Gray-scale sonography as well as color Doppler and duplex ultrasound was  performed to evaluate the arteries of both lower extremities including the common, superficial and profunda femoral arteries, popliteal artery and calf arteries. COMPARISON:  None available FINDINGS: Right Lower Extremity Inflow: Normal common femoral arterial waveforms and velocities. No evidence of inflow (aortoiliac) disease. Outflow: Normal profunda femoral, superficial femoral and popliteal arterial waveforms and velocities. No focal elevation of the PSV to suggest stenosis. Runoff: Normal posterior and anterior tibial arterial waveforms and velocities. Vessels are patent to the ankle. Left Lower Extremity Inflow: Normal common femoral arterial waveforms and velocities. No evidence of inflow (aortoiliac) disease. Outflow: Normal profunda femoral, superficial femoral and popliteal arterial waveforms and velocities. No focal elevation of the PSV to suggest stenosis. Runoff: Normal posterior and anterior tibial arterial waveforms and velocities. Vessels are patent to the ankle. IMPRESSION: 1. Diffusely calcified lower extremity arteries. 2. No Doppler evidence of significant stenosis of lower extremity arteries. Electronically Signed   By: Acquanetta Belling M.D.   On: 01/13/2023 11:51   DG Foot Complete Right  Result Date: 01/12/2023 CLINICAL DATA:  Infection. EXAM: RIGHT FOOT COMPLETE - 3+ VIEW COMPARISON:  January 28, 2022. FINDINGS: Status post amputation of fifth metatarsal and phalanges. No acute fracture or dislocation is noted. Focal defect is seen involving the medial aspect of the distal portion of first distal phalanx suggesting osteomyelitis. Vascular calcifications are noted. IMPRESSION: Possible defect seen involving distal portion of first distal phalanx suggesting osteomyelitis. MRI may be performed for further evaluation. Electronically Signed   By: Lupita Raider M.D.   On: 01/12/2023 11:44   DG Ankle Complete Right  Result Date: 01/12/2023 CLINICAL DATA:  Infection. EXAM: RIGHT ANKLE - COMPLETE 3+  VIEW COMPARISON:  None Available. FINDINGS: There is no evidence of fracture, dislocation, or joint effusion. There is no evidence of arthropathy or other focal bone abnormality. No lytic destruction is noted. Vascular calcifications are noted. IMPRESSION: No acute abnormality seen. Electronically Signed   By: Lupita Raider M.D.   On: 01/12/2023 11:41    Microbiology: Results for orders placed or performed during the hospital encounter of 01/12/23  Culture, blood (routine x 2)     Status: None (Preliminary result)   Collection Time: 01/12/23 11:17 AM   Specimen: BLOOD  Result Value Ref Range Status   Specimen Description BLOOD BLOOD RIGHT ARM  Final   Special Requests   Final    BOTTLES DRAWN AEROBIC AND ANAEROBIC Blood Culture results may not be optimal due to an excessive volume of blood received in culture bottles   Culture   Final    NO GROWTH 3 DAYS Performed at [facility], [address] [place], [place] [address]    Report Status PENDING  Incomplete  Culture, blood (routine x 2)     Status: None (Preliminary result)   Collection Time: 01/12/23 11:18 AM   Specimen: BLOOD  Result Value Ref Range Status   Specimen Description BLOOD BLOOD RIGHT ARM  Final   Special Requests   Final    BOTTLES DRAWN AEROBIC AND ANAEROBIC Blood Culture results may not be optimal due to an excessive volume of blood received in culture bottles   Culture   Final    NO GROWTH 3 DAYS Performed at [facility], [address] [place], [place] [address]    Report Status PENDING  Incomplete    Labs: CBC: Recent Labs  Lab 01/12/23 1010 01/13/23 0415 01/14/23 0416  WBC 14.4* 7.9 7.1  NEUTROABS 9.8*  --   --   HGB 15.8 11.9* 13.5  HCT 45.3 35.2* 38.3*  MCV 91.7 94.4 92.5  PLT 125* 95* 113*   Basic Metabolic Panel: Recent Labs  Lab 01/12/23 1010 01/13/23 0746 01/14/23 0416  NA 131* 133* 133*  K 3.2* 3.8 3.7  CL 96* 102 104  CO2 17* 20* 20*  GLUCOSE 295* 161* 165*   BUN 23* 20 17  CREATININE 1.36* 0.84 0.59*  CALCIUM 9.5 8.7* 9.1   Liver Function Tests: Recent Labs  Lab 01/13/23 0746  AST 67*  ALT 69*  ALKPHOS 48  BILITOT 0.9  PROT 7.3  ALBUMIN 3.7   CBG: Recent Labs  Lab 01/14/23 1118 01/14/23 1728 01/14/23 2147 01/15/23 0804 01/15/23 1120  GLUCAP 139* 152* 120* 151* 120*    Discharge time spent: greater than 30 minutes.  Signed: Enedina Finner, MD [facility] 01/15/2023

## 2023-01-15 NOTE — Plan of Care (Signed)

## 2023-01-16 LAB — CULTURE, BLOOD (ROUTINE X 2): Culture: NO GROWTH

## 2023-01-17 LAB — CULTURE, BLOOD (ROUTINE X 2): Culture: NO GROWTH

## 2023-01-20 ENCOUNTER — Other Ambulatory Visit: Payer: Self-pay | Admitting: Infectious Diseases

## 2023-01-20 MED ORDER — CEFADROXIL 500 MG PO CAPS: 500.0000 mg | ORAL_CAPSULE | Freq: Two times a day (BID) | ORAL | 0 refills | Status: AC

## 2023-01-27 ENCOUNTER — Ambulatory Visit: Payer: Medicaid Other | Attending: Infectious Diseases | Admitting: Infectious Diseases

## 2023-01-27 ENCOUNTER — Encounter: Payer: Self-pay | Admitting: Infectious Diseases

## 2023-01-27 VITALS — BP 113/78 | HR 82 | Temp 96.6°F | Ht 73.0 in | Wt 244.0 lb

## 2023-01-27 DIAGNOSIS — Z87891 Personal history of nicotine dependence: Secondary | ICD-10-CM | POA: Diagnosis not present

## 2023-01-27 DIAGNOSIS — E1140 Type 2 diabetes mellitus with diabetic neuropathy, unspecified: Secondary | ICD-10-CM | POA: Diagnosis not present

## 2023-01-27 DIAGNOSIS — R21 Rash and other nonspecific skin eruption: Secondary | ICD-10-CM | POA: Diagnosis present

## 2023-01-27 DIAGNOSIS — Z7984 Long term (current) use of oral hypoglycemic drugs: Secondary | ICD-10-CM | POA: Diagnosis not present

## 2023-01-27 DIAGNOSIS — E1142 Type 2 diabetes mellitus with diabetic polyneuropathy: Secondary | ICD-10-CM | POA: Diagnosis not present

## 2023-01-27 DIAGNOSIS — Z89431 Acquired absence of right foot: Secondary | ICD-10-CM | POA: Diagnosis not present

## 2023-01-27 DIAGNOSIS — I10 Essential (primary) hypertension: Secondary | ICD-10-CM | POA: Insufficient documentation

## 2023-01-27 DIAGNOSIS — L249 Irritant contact dermatitis, unspecified cause: Secondary | ICD-10-CM

## 2023-01-27 DIAGNOSIS — G40909 Epilepsy, unspecified, not intractable, without status epilepticus: Secondary | ICD-10-CM | POA: Diagnosis not present

## 2023-01-27 DIAGNOSIS — L539 Erythematous condition, unspecified: Secondary | ICD-10-CM | POA: Diagnosis not present

## 2023-01-27 NOTE — Patient Instructions (Signed)
You are here for b/l foot allergic reactin with infection and it has nearly resolved- use regualr bandage for wrapping- You dont need any more antibiotic- there is an area on the left foot which looks like a traumatic blood blister- follow with podiatrist closely.Daniel Ritter

## 2023-01-27 NOTE — Progress Notes (Signed)
NAME: Daniel Ritter  DOB: 24-Nov-1975  MRN: [id number]  Date/Time: 01/27/2023 10:00 AM   Subjective:   Here for follow-up after recent hospitalization between 01/12/2023 until 01/15/2023 for bilateral rash on his legs. Daniel Ritter is a 47 y.o. with a history of diabetes mellitus, peripheral neuropathy, history of right fifth ray amputation for osteomyelitis, hypertension, seizure disorder, presented with bilateral Foot erythema and itching. Patient had stated that he was wrapping both his feet for many months and recently had used a different wrap which was collared. He noted redness appearing on the dorsum of the right extending to his legs.  It was itchy.  He did not have any fever or chills. He was treated with oral Keflex as well as with topical steroids.  Now he is here for follow-up The rash is almost resolved   Past Medical History:  Diagnosis Date   Allergy    seasonal   Anxiety    Diabetes mellitus without complication (HCC)    GERD (gastroesophageal reflux disease)    H/O ETOH abuse    History of rectal abscess    Hypertension 2007   Neuropathy    Pancreatitis    history of, pt states due to alcohol   Seizures (HCC) 08/2014   " two seizures in the hospital"   Stroke [facility]) 08/31/2013   Thrombocytopenia [facility])     Past Surgical History:  Procedure Laterality Date   AMPUTATION TOE Right 01/30/2022   Procedure: PARTIAL 5TH RAY AMUPTATION RIGHT FOOT;  Surgeon: Gwyneth Revels, DPM;  Location: [facility] ORS;  Service: Podiatry;  Laterality: Right;   ANTERIOR CRUCIATE LIGAMENT REPAIR Left 1992, 1993   COLONOSCOPY N/A 09/04/2021   Procedure: COLONOSCOPY;  Surgeon: Toledo, Boykin Nearing, MD;  Location: [facility] ENDOSCOPY;  Service: Gastroenterology;  Laterality: N/A;  DM   INCISION AND DRAINAGE PERIRECTAL ABSCESS N/A 12/14/2015   Procedure: IRRIGATION AND DEBRIDEMENT PERIRECTAL ABSCESS;  Surgeon: Kieth Brightly, MD;  Location: [facility] ORS;  Service: General;  Laterality: N/A;   IRRIGATION  AND DEBRIDEMENT FOOT Bilateral 01/30/2022   Procedure: IRRIGATION AND DEBRIDEMENT TENDON ULCER LEFT FOOT,  EXCISION OF 2ND TOE JOINT LEFT FOOT, IRRIGATION AND DEBRIDEMENT SUBCUTANEOUS ULCER RIGHT FOOT;  Surgeon: Gwyneth Revels, DPM;  Location: [facility] ORS;  Service: Podiatry;  Laterality: Bilateral;   IRRIGATION AND DEBRIDEMENT FOOT Right 05/01/2022   Procedure: IRRIGATION AND DEBRIDEMENT FOOT;SOFT TISSUE AND BONE;  Surgeon: Linus Galas, DPM;  Location: [facility] ORS;  Service: Podiatry;  Laterality: Right;   LOWER EXTREMITY ANGIOGRAPHY Right 02/04/2022   Procedure: Lower Extremity Angiography;  Surgeon: Renford Dills, MD;  Location: [facility] INVASIVE CV LAB;  Service: Cardiovascular;  Laterality: Right;    Social History   Socioeconomic History   Marital status: Divorced    Spouse name: Not on file   Number of children: 0   Years of education: 14   Highest education level: Not on file  Occupational History   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[place], no children   Right handed   caffeine use -  2 cups coffee daily, usually decaf   Social Determinants  of Health   Financial Resource Strain: Not on file  Food Insecurity: No Food Insecurity (01/13/2023)   Hunger Vital Sign    Worried About Running Out of Food in the Last Year: Never true    Ran Out of Food in the Last Year: Never true  Transportation Needs: No Transportation Needs (01/13/2023)   PRAPARE - Administrator, Civil Service (Medical): No    Lack of Transportation (Non-Medical): No  Physical Activity: Not on file  Stress: Not on file  Social Connections: Not on file  Intimate Partner Violence: Not At Risk (01/13/2023)   Humiliation, Afraid, Rape, and Kick questionnaire    Fear of Current or Ex-Partner: No    Emotionally Abused: No    Physically Abused: No    Sexually Abused: No    Family History  Problem Relation Age of Onset   Hypertension Mother    Anxiety disorder Mother    Hypertension Father    Hypertension Brother    Leukemia Maternal Grandfather    Lung cancer Paternal Grandfather    No Known Allergies I? Current Outpatient Medications  Medication Sig Dispense Refill   acetaminophen (TYLENOL) 325 MG tablet Take 2 tablets (650 mg total) by mouth every 6 (six) hours as needed for mild pain (or Fever >/= 101).     ALPRAZolam (XANAX) 0.25 MG tablet Take 0.25 mg by mouth 2 (two) times daily.     amLODipine (NORVASC) 10 MG tablet Take 10 mg by mouth daily as needed.     ascorbic acid (VITAMIN C) 500 MG tablet Take 1 tablet (500 mg total) by mouth 2 (two) times daily. 30 tablet 0   cefadroxil (DURICEF) 500 MG capsule Take 1 capsule (500 mg total) by mouth 2 (two) times daily for 7 days. 14 capsule 0   chlorhexidine (PERIDEX) 0.12 % solution Use as directed 15 mLs in the mouth or throat 2 (two) times daily.     DULoxetine (CYMBALTA) 30 MG capsule Take 30 mg by mouth daily.     JANUVIA 100 MG tablet Take 100 mg by mouth daily.     levETIRAcetam (KEPPRA) 500 MG tablet Take 1 tablet (500 mg total) by mouth 2 (two) times daily. 30 tablet 2    lisinopril-hydrochlorothiazide (ZESTORETIC) 20-25 MG tablet Take 1 tablet by mouth daily.     metFORMIN (GLUCOPHAGE) 1000 MG tablet Take 1,000 mg by mouth 2 (two) times daily with a meal.     metoprolol succinate (TOPROL-XL) 100 MG 24 hr tablet Take 100 mg by mouth daily. Take with or immediately following a meal.     Multiple Vitamins-Minerals (MULTIVITAMIN WITH MINERALS) tablet Take 1 tablet by mouth daily.     omeprazole (PRILOSEC) 20 MG capsule Take 20 mg by mouth 2 (two) times daily before a  meal.     triamcinolone cream (KENALOG) 0.5 % Apply topically 2 (two) times daily. 30 g 0   zinc sulfate 220 (50 Zn) MG capsule Take 1 capsule (220 mg total) by mouth daily. 30 capsule 0   No current facility-administered medications for this visit.     Abtx:  Anti-infectives (From admission, onward)    None       REVIEW OF SYSTEMS:  Const: negative fever, negative chills, negative weight loss Eyes: negative diplopia or visual changes, negative eye pain ENT: negative coryza, negative sore throat Resp: negative cough, hemoptysis, dyspnea Cards: negative for chest pain, palpitations, lower extremity edema GU: negative for frequency, dysuria and hematuria GI: Negative for abdominal pain, diarrhea, bleeding, constipation Skin: Rash much improved  heme: negative for easy bruising and gum/nose bleeding MS: negative for myalgias, arthralgias, back pain and muscle weakness Neurolo:negative for headaches, dizziness, vertigo, memory problems  Psych: negative for feelings of anxiety, depression  Endocrine: Diabetes  Allergy/Immunology- negative for any medication or food allergies ?  Objective:  VITALS:  BP 113/78   Pulse 82   Temp (!) 96.6 F (35.9 C) (Temporal)   Ht 6\' 1"  (1.854 m)   Wt 244 lb (110.7 kg)   BMI 32.19 kg/m   PHYSICAL EXAM:  General: Alert, cooperative, no distress, appears stated age.  Head: Normocephalic, without obvious abnormality, atraumatic. Eyes: Conjunctivae  clear, anicteric sclerae. Pupils are equal ENT Nares normal. No drainage or sinus tenderness. Lips, mucosa, and tongue normal. No Thrush Neck: Supple, symmetrical, no adenopathy, thyroid: non tender no carotid bruit and no JVD. Back: No CVA tenderness. Lungs: Clear to auscultation bilaterally. No Wheezing or Rhonchi. No rales. Heart: Regular rate and rhythm, no murmur, rub or gallop. Abdomen: Soft, non-tender,not distended. Bowel sounds normal. No masses Extremities: Erythematous purpuric rash is almost resolved Today            While in hospital                  Lymph: Cervical, supraclavicular normal. Neurologic: Grossly non-focal Pertinent Labs None  Impression/Recommendation Bilateral feet erythema and purpura with a rash suggestive of contact dermatitis versus infection it was not typical for cellulitis It looks like Kenalog and cefadroxil both has cleared the rash almost completely he does not need any more antibiotic   History of right fifth toe amputation in the past  Superficial wound on the left fifth toe looks more like a superficial blood blister and is followed by podiatrist.? No obvious infection  Diabetes mellitus  Peripheral neuropathy Discussed with patient in detail.  Follow-up as needed   Note:  This document was prepared using Dragon voice recognition software and may include unintentional dictation errors.

## 2024-02-03 ENCOUNTER — Emergency Department
Admission: EM | Admit: 2024-02-03 | Discharge: 2024-02-03 | Disposition: A | Discharge status: Home / Self Care | Source: Ambulatory Visit | Attending: Emergency Medicine | Admitting: Emergency Medicine

## 2024-02-03 ENCOUNTER — Emergency Department

## 2024-02-03 ENCOUNTER — Other Ambulatory Visit: Payer: Self-pay

## 2024-02-03 DIAGNOSIS — I10 Essential (primary) hypertension: Secondary | ICD-10-CM | POA: Diagnosis not present

## 2024-02-03 DIAGNOSIS — L97419 Non-pressure chronic ulcer of right heel and midfoot with unspecified severity: Secondary | ICD-10-CM | POA: Diagnosis not present

## 2024-02-03 DIAGNOSIS — E11621 Type 2 diabetes mellitus with foot ulcer: Secondary | ICD-10-CM | POA: Diagnosis not present

## 2024-02-03 DIAGNOSIS — L03116 Cellulitis of left lower limb: Secondary | ICD-10-CM | POA: Diagnosis present

## 2024-02-03 DIAGNOSIS — L0390 Cellulitis, unspecified: Secondary | ICD-10-CM

## 2024-02-03 DIAGNOSIS — L97519 Non-pressure chronic ulcer of other part of right foot with unspecified severity: Secondary | ICD-10-CM

## 2024-02-03 LAB — CBC WITH DIFFERENTIAL/PLATELET
Abs Immature Granulocytes: 0.07 K/uL (ref 0.00–0.07)
Basophils Absolute: 0.1 K/uL (ref 0.0–0.1)
Basophils Relative: 1 %
Eosinophils Absolute: 0.2 K/uL (ref 0.0–0.5)
Eosinophils Relative: 2 %
HCT: 40.5 % (ref 39.0–52.0)
Hemoglobin: 14.3 g/dL (ref 13.0–17.0)
Immature Granulocytes: 1 %
Lymphocytes Relative: 21 %
Lymphs Abs: 2 K/uL (ref 0.7–4.0)
MCH: 31.1 pg (ref 26.0–34.0)
MCHC: 35.3 g/dL (ref 30.0–36.0)
MCV: 88 fL (ref 80.0–100.0)
Monocytes Absolute: 0.6 K/uL (ref 0.1–1.0)
Monocytes Relative: 6 %
Neutro Abs: 6.7 K/uL (ref 1.7–7.7)
Neutrophils Relative %: 69 %
Platelets: 154 K/uL (ref 150–400)
RBC: 4.6 MIL/uL (ref 4.22–5.81)
RDW: 12.8 % (ref 11.5–15.5)
WBC: 9.7 K/uL (ref 4.0–10.5)
nRBC: 0 % (ref 0.0–0.2)

## 2024-02-03 LAB — COMPREHENSIVE METABOLIC PANEL WITH GFR
ALT: 40 U/L (ref 0–44)
AST: 34 U/L (ref 15–41)
Albumin: 4.1 g/dL (ref 3.5–5.0)
Alkaline Phosphatase: 59 U/L (ref 38–126)
Anion gap: 10 (ref 5–15)
BUN: 14 mg/dL (ref 6–20)
CO2: 24 mmol/L (ref 22–32)
Calcium: 9.2 mg/dL (ref 8.9–10.3)
Chloride: 101 mmol/L (ref 98–111)
Creatinine, Ser: 0.84 mg/dL (ref 0.61–1.24)
GFR, Estimated: >60 mL/min (ref >60)
Glucose, Bld: 184 mg/dL — ABNORMAL HIGH (ref 70–99)
Potassium: 4.4 mmol/L (ref 3.5–5.1)
Sodium: 135 mmol/L (ref 135–145)
Total Bilirubin: 0.6 mg/dL (ref 0.0–1.2)
Total Protein: 7.7 g/dL (ref 6.5–8.1)

## 2024-02-03 LAB — LACTIC ACID, PLASMA: Lactic Acid, Venous: 1.3 mmol/L (ref 0.5–1.9)

## 2024-02-03 LAB — URINALYSIS, W/ REFLEX TO CULTURE (INFECTION SUSPECTED)
Bacteria, UA: NONE SEEN
Bilirubin Urine: NEGATIVE
Glucose, UA: 50 mg/dL — AB
Hgb urine dipstick: NEGATIVE
Ketones, ur: 5 mg/dL — AB
Leukocytes,Ua: NEGATIVE
Nitrite: NEGATIVE
Protein, ur: 30 mg/dL — AB
Specific Gravity, Urine: 1.018 (ref 1.005–1.030)
Squamous Epithelial / HPF: 0 /HPF (ref 0–5)
pH: 5 (ref 5.0–8.0)

## 2024-02-03 MED ORDER — OXYCODONE HCL 5 MG PO TABS: 5.0000 mg | ORAL_TABLET | Freq: Four times a day (QID) | ORAL | 0 refills | Status: AC | PRN

## 2024-02-03 MED ORDER — DOXYCYCLINE HYCLATE 100 MG PO TABS: 100.0000 mg | ORAL_TABLET | Freq: Two times a day (BID) | ORAL | 0 refills | Status: AC

## 2024-02-03 MED ORDER — DOXYCYCLINE HYCLATE 100 MG PO TABS
100.0000 mg | ORAL_TABLET | Freq: Once | ORAL | Status: AC
  Administered 2024-02-03: 100 mg via ORAL
  Filled 2024-02-03: qty 1

## 2024-02-03 MED ORDER — CEFADROXIL 500 MG PO CAPS: 500.0000 mg | ORAL_CAPSULE | Freq: Two times a day (BID) | ORAL | 0 refills | Status: AC

## 2024-02-03 MED ORDER — CEFADROXIL 500 MG PO CAPS
500.0000 mg | ORAL_CAPSULE | Freq: Once | ORAL | Status: AC
  Administered 2024-02-03: 500 mg via ORAL
  Filled 2024-02-03: qty 1

## 2024-02-03 NOTE — ED Notes (Signed)
#  1st set of blood cultures sent to lab.

## 2024-02-03 NOTE — ED Triage Notes (Signed)
 Patient sent over from [facility] for wound infection to right foot.

## 2024-02-03 NOTE — ED Provider Notes (Signed)
 [facility] Provider Note    Event Date/Time   First MD Initiated Contact with Patient 02/03/24 1451     (approximate)   History   Wound Infection   HPI  Daniel Ritter is a 48 y.o. male with history of osteomyelitis, type 2 diabetes, hypertension, seizure disorder who comes in with wound on the left leg.  Patient reports that he had been putting some antibiotic cream over the ulcer when he noticed that the ulcer was ripped open and then he developed some redness on the leg.  He still able to move the leg good distal pulse no fevers.  He took some leftover cefuroxime and pills that he had on Saturday and Sunday and reported that it was getting a little bit better but then he ran out so started getting worse again.  He reports that he wears boots that rub up against this area which he feels like is contributing as well.     Physical Exam   Triage Vital Signs: ED Triage Vitals  Encounter Vitals Group     BP 02/03/24 1401 (!) 155/91     Girls Systolic BP Percentile --      Girls Diastolic BP Percentile --      Boys Systolic BP Percentile --      Boys Diastolic BP Percentile --      Pulse Rate 02/03/24 1401 70     Resp 02/03/24 1401 18     Temp 02/03/24 1401 99.2 F (37.3 C)     Temp Source 02/03/24 1401 Oral     SpO2 02/03/24 1401 98 %     Weight 02/03/24 1402 252 lb (114.3 kg)     Height 02/03/24 1402 6' 1 (1.854 m)     Head Circumference --      Peak Flow --      Pain Score 02/03/24 1401 8     Pain Loc --      Pain Education --      Exclude from Growth Chart --     Most recent vital signs: Vitals:   02/03/24 1401  BP: (!) 155/91  Pulse: 70  Resp: 18  Temp: 99.2 F (37.3 C)  SpO2: 98%     General: Awake, no distress.  CV:  Good peripheral perfusion.  Resp:  Normal effort.  Abd:  No distention.  Other:  Good distal pulse.  Ulceration noted on the medial malleolus with some surrounding erythema.  No obvious fluctuation noted.  Able  to flex and extend the ankle   ED Results / Procedures / Treatments   Labs (all labs ordered are listed, but only abnormal results are displayed) Labs Reviewed  COMPREHENSIVE METABOLIC PANEL WITH GFR - Abnormal; Notable for the following components:      Result Value   Glucose, Bld 184 (*)    All other components within normal limits  URINALYSIS, W/ REFLEX TO CULTURE (INFECTION SUSPECTED) - Abnormal; Notable for the following components:   Color, Urine YELLOW (*)    APPearance CLEAR (*)    Glucose, UA 50 (*)    Ketones, ur 5 (*)    Protein, ur 30 (*)    All other components within normal limits  LACTIC ACID, PLASMA  CBC WITH DIFFERENTIAL/PLATELET  LACTIC ACID, PLASMA     RADIOLOGY I have reviewed the xray personally and interpreted no evidence of bony erosion   PROCEDURES:  Critical Care performed: No  Procedures   MEDICATIONS ORDERED IN ED:  Medications  doxycycline  (VIBRA -TABS) tablet 100 mg (has no administration in time range)  cefadroxil  (DURICEF) capsule 500 mg (has no administration in time range)     IMPRESSION / MDM / ASSESSMENT AND PLAN / ED COURSE  I reviewed the triage vital signs and the nursing notes.   Patient's presentation is most consistent with acute presentation with potential threat to life or bodily function.   Patient comes in with concerns for ulceration, cellulitis good distal pulse unlikely arterial issue foot is warm and well-perfused.  Blood work ordered evaluate for electrolyte abnormalities, DKA.  No evidence of septic joint based upon examination  UA with some ketones Lactate is normal CMP glucose elevated at 184 CBC normal  Discussed with Dr. Ashley from podiatry did show him a picture in the media tab.  Given patient is afebrile, normal white count x-ray without evidence of osteo would be reasonable to trial outpatient antibiotics, they will follow-up in clinic and recommend he call to make an appointment.  We discussed  admission versus outpatient antibiotics and patient was comfortable with outpatient antibiotics.  He understands the importance of calling podiatry to make a follow-up appointment.  Will give him his first dose of antibiotics here.  Based upon his discharge summary last year I will do the cefadroxil  but I will also add on Doxy just to cover for MRSA as well.  Patient also requesting something for pain.  Reports being on oxycodone  previously and tolerating well.  Will give a short course of oxycodone  to help with pain.  Also recommended he not wear his boots for work until this is healed as this will continue to rub up against it and make it worse.  He expressed understanding.  The patient is on the cardiac monitor to evaluate for evidence of arrhythmia and/or significant heart rate changes.      FINAL CLINICAL IMPRESSION(S) / ED DIAGNOSES   Final diagnoses:  Cellulitis, unspecified cellulitis site  Ulcer of right foot, unspecified ulcer stage (HCC)     Rx / DC Orders   ED Discharge Orders          Ordered    cefadroxil  (DURICEF) 500 MG capsule  2 times daily        02/03/24 1645    doxycycline  (VIBRA -TABS) 100 MG tablet  2 times daily        02/03/24 1645    oxyCODONE  (ROXICODONE ) 5 MG immediate release tablet  Every 6 hours PRN        02/03/24 1645             Note:  This document was prepared using Dragon voice recognition software and may include unintentional dictation errors.   Ernest Ronal BRAVO, MD 02/03/24 [phone]

## 2024-02-03 NOTE — Discharge Instructions (Addendum)
 Take both antibiotics to help with infection.  Please call podiatry to make a follow-up appointment.  State you need an ER follow-up.  Avoid wearing your boots until this is completely healed.  If you develop fevers, worsening spreading of the redness or any other concerns please return to the ER for repeat evaluation  Take MiraLAX to prevent constipation with oxycodone .   Take oxycodone  as prescribed. Do not drink alcohol, drive or participate in any other potentially dangerous activities while taking this medication as it may make you sleepy. Do not take this medication with any other sedating medications, either prescription or over-the-counter. If you were prescribed Percocet or Vicodin, do not take these with acetaminophen  (Tylenol ) as it is already contained within these medications.  This medication is an opiate (or narcotic) pain medication and can be habit forming. Use it as little as possible to achieve adequate pain control. Do not use or use it with extreme caution if you have a history of opiate abuse or dependence. If you are on a pain contract with your primary care doctor or a pain specialist, be sure to let them know you were prescribed this medication today from the Emergency Department. This medication is intended for your use only - do not give any to anyone else and keep it in a secure place where nobody else, especially children, have access to it.

## 2024-03-10 ENCOUNTER — Other Ambulatory Visit (INDEPENDENT_AMBULATORY_CARE_PROVIDER_SITE_OTHER): Payer: Self-pay | Admitting: Vascular Surgery

## 2024-03-10 DIAGNOSIS — I739 Peripheral vascular disease, unspecified: Secondary | ICD-10-CM

## 2024-03-14 ENCOUNTER — Encounter (INDEPENDENT_AMBULATORY_CARE_PROVIDER_SITE_OTHER): Payer: Self-pay | Admitting: Nurse Practitioner

## 2024-03-14 ENCOUNTER — Ambulatory Visit (INDEPENDENT_AMBULATORY_CARE_PROVIDER_SITE_OTHER)

## 2024-03-14 ENCOUNTER — Ambulatory Visit (INDEPENDENT_AMBULATORY_CARE_PROVIDER_SITE_OTHER): Admitting: Nurse Practitioner

## 2024-03-14 VITALS — BP 137/89 | HR 76 | Resp 18 | Ht 73.0 in | Wt 247.0 lb

## 2024-03-14 DIAGNOSIS — I7025 Atherosclerosis of native arteries of other extremities with ulceration: Secondary | ICD-10-CM

## 2024-03-14 DIAGNOSIS — I10 Essential (primary) hypertension: Secondary | ICD-10-CM

## 2024-03-14 DIAGNOSIS — I739 Peripheral vascular disease, unspecified: Secondary | ICD-10-CM

## 2024-03-14 DIAGNOSIS — E1100 Type 2 diabetes mellitus with hyperosmolarity without nonketotic hyperglycemic-hyperosmolar coma (NKHHC): Secondary | ICD-10-CM | POA: Diagnosis not present

## 2024-03-17 ENCOUNTER — Telehealth (INDEPENDENT_AMBULATORY_CARE_PROVIDER_SITE_OTHER): Payer: Self-pay

## 2024-03-17 NOTE — Telephone Encounter (Signed)
 I attempted to contact the patient to schedule him for a RLE angio with Dr. Gilda Crease. A message was left for a return call.

## 2024-03-17 NOTE — Telephone Encounter (Signed)
 Patient returned my call and is now scheduled with Dr. Jama for a RLE angio on 03/22/24 with a 12:00 pm arrival time to the [facility]. Pre-procedure instructions were discussed and will be sent to Mychart and mailed.

## 2024-03-20 ENCOUNTER — Encounter (INDEPENDENT_AMBULATORY_CARE_PROVIDER_SITE_OTHER): Payer: Self-pay | Admitting: Nurse Practitioner

## 2024-03-20 NOTE — H&P (View-Only) (Signed)
 Subjective:    Patient ID: Daniel Ritter, male    DOB: 1976-01-21, 48 y.o.   MRN: [id number] Chief Complaint  Patient presents with   New Patient (Initial Visit)    Ref Neill consult PAD    The patient is seen for evaluation of painful lower extremities and diminished pulses associated with ulceration of the foot.  The patient notes the ulcer has been present for multiple months and slowly improving.  It is very painful and has had some drainage.  No specific history of trauma noted by the patient.  The patient denies fever or chills.  the patient does have diabetes which has been difficult to control.  Patient notes prior to the ulcer developing the extremities were painful particularly with walking.  The patient denies rest pain or dangling of an extremity off the side of the bed during the night for relief.  No history of back problems or DJD of the lumbar sacral spine.   The patient denies amaurosis fugax or recent TIA symptoms. There are no recent neurological changes noted. The patient denies history of DVT, PE or superficial thrombophlebitis. The patient denies recent episodes of angina or shortness of breath.   The patient had an ABI that is noncompressible on the right with a diminished TBI of 0.53 with an ABI of 1.16 on the left.  There is monophasic waveforms with dampened toe waveforms on the right with multiphasic waveforms and good toe waveforms on the left    Review of Systems  Skin:  Positive for wound.  All other systems reviewed and are negative.      Objective:   Physical Exam Vitals reviewed.  HENT:     Head: Normocephalic.  Cardiovascular:     Rate and Rhythm: Normal rate.     Pulses:          Dorsalis pedis pulses are detected w/ Doppler on the right side and detected w/ Doppler on the left side.       Posterior tibial pulses are detected w/ Doppler on the right side and detected w/ Doppler on the left side.  Pulmonary:     Effort: Pulmonary effort  is normal.  Skin:    General: Skin is warm and dry.  Neurological:     Mental Status: He is alert and oriented to person, place, and time.     Gait: Gait abnormal.  Psychiatric:        Mood and Affect: Mood normal.        Behavior: Behavior normal.        Thought Content: Thought content normal.        Judgment: Judgment normal.     BP 137/89   Pulse 76   Resp 18   Ht 6' 1 (1.854 m)   Wt 247 lb (112 kg)   BMI 32.59 kg/m   Past Medical History:  Diagnosis Date   Allergy    seasonal   Anxiety    Diabetes mellitus without complication (HCC)    GERD (gastroesophageal reflux disease)    H/O ETOH abuse    History of rectal abscess    Hypertension 2007   Neuropathy    Pancreatitis    history of, pt states due to alcohol   Seizures (HCC) 08/2014    two seizures in the hospital   Stroke [facility]) 08/31/2013   Thrombocytopenia [facility])     Social History   Socioeconomic History   Marital status: Divorced    Spouse  name: Not on file   Number of children: 0   Years of education: 14   Highest education level: Not on file  Occupational History   Occupation: Event organiser: [employer]   Occupation: diabled  Tobacco Use   Smoking status: Former    Current packs/day: 0.00    Average packs/day: 0.3 packs/day for 18.0 years (4.5 ttl pk-yrs)    Types: Cigarettes    Start date: 04/11/2002    Quit date: 04/11/2020    Years since quitting: 3.9   Smokeless tobacco: Former    Types: Snuff    Quit date: 2001   Tobacco comments:    patient quit for 3 years and then started back . 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[place], no children   Right handed   caffeine use -  2 cups coffee daily, usually decaf   Social Drivers of Health   Financial Resource Strain: High Risk (06/17/2023)   Received from [facility]   Overall Financial Resource Strain (CARDIA)    Difficulty of Paying Living Expenses: Very hard  Food Insecurity: Food Insecurity Present (06/17/2023)   Received from [facility]   Hunger Vital Sign    Within the past 12 months, you worried that your food would run out before you got the money to buy more.: Sometimes true    Within the past 12 months, the food you bought just didn't last and you didn't have money to get more.: Sometimes true  Transportation Needs: Unmet Transportation Needs (06/17/2023)   Received from [facility] - Transportation    In the past 12 months, has lack of transportation kept you from medical appointments or from getting medications?: Yes    Lack of Transportation (Non-Medical): Yes  Physical Activity: Unknown (06/17/2023)   Received from [facility]   Exercise Vital Sign    On average, how many days per week do you engage in moderate to strenuous exercise (like a brisk walk)?: 6 days    Minutes of Exercise per Session: Not on file  Stress: Stress Concern Present (06/17/2023)   Received from [facility] - Occupational Stress Questionnaire    Feeling of Stress : Rather much  Social Connections: Moderately Isolated (06/17/2023)   Received from [facility]   Social Connection and Isolation Panel    In a typical week, how many times do you talk on the phone with family, friends, or neighbors?: More than three times a week    How often do you get together with friends or relatives?: Three times a week    How often do you attend church or religious services?: 1  to 4 times per year    Do you belong to any clubs or organizations such as church groups, unions, fraternal or athletic groups, or school groups?: No    How  often do you attend meetings of the clubs or organizations you belong to?: Never    Are you married, widowed, divorced, separated, never married, or living with a partner?: Divorced  Intimate Partner Violence: Not At Risk (01/13/2023)   Humiliation, Afraid, Rape, and Kick questionnaire    Fear of Current or Ex-Partner: No    Emotionally Abused: No    Physically Abused: No    Sexually Abused: No    Past Surgical History:  Procedure Laterality Date   AMPUTATION TOE Right 01/30/2022   Procedure: PARTIAL 5TH RAY AMUPTATION RIGHT FOOT;  Surgeon: Ashley Soulier, DPM;  Location: [facility] ORS;  Service: Podiatry;  Laterality: Right;   ANTERIOR CRUCIATE LIGAMENT REPAIR Left 1992, 1993   COLONOSCOPY N/A 09/04/2021   Procedure: COLONOSCOPY;  Surgeon: Toledo, Ladell POUR, MD;  Location: [facility] ENDOSCOPY;  Service: Gastroenterology;  Laterality: N/A;  DM   INCISION AND DRAINAGE PERIRECTAL ABSCESS N/A 12/14/2015   Procedure: IRRIGATION AND DEBRIDEMENT PERIRECTAL ABSCESS;  Surgeon: Louanne KANDICE Muse, MD;  Location: [facility] ORS;  Service: General;  Laterality: N/A;   IRRIGATION AND DEBRIDEMENT FOOT Bilateral 01/30/2022   Procedure: IRRIGATION AND DEBRIDEMENT TENDON ULCER LEFT FOOT,  EXCISION OF 2ND TOE JOINT LEFT FOOT, IRRIGATION AND DEBRIDEMENT SUBCUTANEOUS ULCER RIGHT FOOT;  Surgeon: Ashley Soulier, DPM;  Location: [facility] ORS;  Service: Podiatry;  Laterality: Bilateral;   IRRIGATION AND DEBRIDEMENT FOOT Right 05/01/2022   Procedure: IRRIGATION AND DEBRIDEMENT FOOT;SOFT TISSUE AND BONE;  Surgeon: Neill Boas, DPM;  Location: [facility] ORS;  Service: Podiatry;  Laterality: Right;   LOWER EXTREMITY ANGIOGRAPHY Right 02/04/2022   Procedure: Lower Extremity Angiography;  Surgeon: Jama Cordella KANDICE, MD;  Location: [facility] INVASIVE CV LAB;  Service: Cardiovascular;  Laterality:  Right;    Family History  Problem Relation Age of Onset   Hypertension Mother    Anxiety disorder Mother    Hypertension Father    Hypertension Brother    Leukemia Maternal Grandfather    Lung cancer Paternal Grandfather     No Known Allergies     Latest Ref Rng & Units 02/03/2024    2:04 PM 01/14/2023    4:16 AM 01/13/2023    4:15 AM  CBC  WBC 4.0 - 10.5 K/uL 9.7  7.1  7.9   Hemoglobin 13.0 - 17.0 g/dL 85.6  86.4  88.0   Hematocrit 39.0 - 52.0 % 40.5  38.3  35.2   Platelets 150 - 400 K/uL 154  113  95       CMP     Component Value Date/Time   NA 135 02/03/2024 1404   NA 140 11/08/2014 0851   NA 136 09/20/2011 0727   K 4.4 02/03/2024 1404   K 4.0 10/10/2011 1407   CL 101 02/03/2024 1404   CL 103 09/20/2011 0727   CO2 24 02/03/2024 1404   CO2 21 09/20/2011 0727   GLUCOSE 184 (H) 02/03/2024 1404   GLUCOSE 153 (H) 09/20/2011 0727   BUN 14 02/03/2024 1404   BUN 13 11/08/2014 0851   BUN 11 09/20/2011 0727   CREATININE 0.84 02/03/2024 1404   CREATININE 0.75 10/10/2011 1407   CALCIUM 9.2 02/03/2024 1404   CALCIUM 9.2 09/20/2011 0727   PROT 7.7 02/03/2024 1404   PROT 8.9 (H) 09/17/2011 1058   ALBUMIN 4.1 02/03/2024 1404   ALBUMIN 4.3 09/17/2011 1058   AST 34 02/03/2024 1404   AST 10 (L) 09/17/2011 1058   ALT 40 02/03/2024 1404   ALT 25 09/17/2011 1058   ALKPHOS  59 02/03/2024 1404   ALKPHOS 132 09/17/2011 1058   BILITOT 0.6 02/03/2024 1404   BILITOT 0.6 09/17/2011 1058   GFRNONAA >60 02/03/2024 1404   GFRNONAA >60 10/10/2011 1407     VAS US  ABI WITH/WO TBI Result Date: 03/16/2024  LOWER EXTREMITY DOPPLER STUDY Patient Name:  JAMIE BELGER  Date of Exam:   03/14/2024 Medical Rec #: [id number]        Accession #:    [id number] Date of Birth: Nov 18, 1975        Patient Gender: M Patient Age:   82 years Exam Location:  [facility] Procedure:      VAS US  ABI WITH/WO TBI Referring Phys:  --------------------------------------------------------------------------------  Indications: Peripheral artery disease.  Performing Technologist: Jerel Croak RVT  Examination Guidelines: A complete evaluation includes at minimum, Doppler waveform signals and systolic blood pressure reading at the level of bilateral brachial, anterior tibial, and posterior tibial arteries, when vessel segments are accessible. Bilateral testing is considered an integral part of a complete examination. Photoelectric Plethysmograph (PPG) waveforms and toe systolic pressure readings are included as required and additional duplex testing as needed. Limited examinations for reoccurring indications may be performed as noted.  ABI Findings: +---------+------------------+-----+----------+--------+ Right    Rt Pressure (mmHg)IndexWaveform  Comment  +---------+------------------+-----+----------+--------+ Brachial 137                                       +---------+------------------+-----+----------+--------+ PTA      255               1.86 monophasic         +---------+------------------+-----+----------+--------+ DP       130               0.95 monophasic         +---------+------------------+-----+----------+--------+ Great Toe72                0.53 Abnormal           +---------+------------------+-----+----------+--------+ +---------+------------------+-----+---------+-------+ Left     Lt Pressure (mmHg)IndexWaveform Comment +---------+------------------+-----+---------+-------+ Brachial 135                                     +---------+------------------+-----+---------+-------+ PTA      158               1.15 triphasic        +---------+------------------+-----+---------+-------+ DP       159               1.16 biphasic         +---------+------------------+-----+---------+-------+ [place] Number               0.93 Normal            +---------+------------------+-----+---------+-------+  Summary: Right: Resting right ankle-brachial index indicates noncompressible right lower extremity arteries. The right toe-brachial index is abnormal.  Left: Resting left ankle-brachial index is within normal range. The left toe-brachial index is normal.  *See table(s) above for measurements and observations.  Electronically signed by Selinda Gu MD on 03/16/2024 at 7:25:18 AM.    Final        Assessment & Plan:   1. Atherosclerosis of native arteries of the extremities with ulceration (HCC) (Primary)  Recommend:  The patient has evidence of severe atherosclerotic changes of both lower extremities  associated with ulceration and tissue loss of the right foot.  This represents a limb threatening ischemia and places the patient at the risk for left limb loss.  Patient should undergo angiography of the right lower extremity with the hope for intervention for limb salvage.  The risks and benefits as well as the alternative therapies was discussed in detail with the patient.  All questions were answered.  Patient agrees to proceed with left lower extremity angiography.  The patient will follow up with me in the office after the procedure.   2. Essential hypertension Continue antihypertensive medications as already ordered, these medications have been reviewed and there are no changes at this time.  3. Type 2 diabetes mellitus with hyperosmolarity without coma, unspecified whether long term insulin  use (HCC) Continue hypoglycemic medications as already ordered, these medications have been reviewed and there are no changes at this time.  Hgb A1C to be monitored as already arranged by primary service   Current Outpatient Medications on File Prior to Visit  Medication Sig Dispense Refill   acetaminophen  (TYLENOL ) 325 MG tablet Take 2 tablets (650 mg total) by mouth every 6 (six) hours as needed for mild pain (or Fever >/= 101).     ALPRAZolam   (XANAX ) 0.25 MG tablet Take 0.25 mg by mouth 2 (two) times daily. (Patient taking differently: Take 0.25 mg by mouth 2 (two) times daily as needed.)     amLODipine  (NORVASC ) 10 MG tablet Take 10 mg by mouth daily as needed.     ascorbic acid  (VITAMIN C ) 500 MG tablet Take 1 tablet (500 mg total) by mouth 2 (two) times daily. 30 tablet 0   atorvastatin (LIPITOR) 20 MG tablet Take 20 mg by mouth daily.     chlorhexidine  (PERIDEX ) 0.12 % solution Use as directed 15 mLs in the mouth or throat 2 (two) times daily.     DULoxetine  (CYMBALTA ) 30 MG capsule Take 30 mg by mouth daily.     JANUVIA 100 MG tablet Take 100 mg by mouth daily.     levETIRAcetam  (KEPPRA ) 500 MG tablet Take 1 tablet (500 mg total) by mouth 2 (two) times daily. 30 tablet 2   lisinopril -hydrochlorothiazide  (ZESTORETIC ) 20-25 MG tablet Take 1 tablet by mouth daily.     metFORMIN  (GLUCOPHAGE ) 1000 MG tablet Take 1,000 mg by mouth 2 (two) times daily with a meal.     metoprolol  succinate (TOPROL -XL) 100 MG 24 hr tablet Take 100 mg by mouth daily. Take with or immediately following a meal.     Multiple Vitamins-Minerals (MULTIVITAMIN WITH MINERALS) tablet Take 1 tablet by mouth daily.     omeprazole (PRILOSEC) 20 MG capsule Take 20 mg by mouth 2 (two) times daily before a meal.     triamcinolone  cream (KENALOG ) 0.5 % Apply topically 2 (two) times daily. 30 g 0   zinc  sulfate 220 (50 Zn) MG capsule Take 1 capsule (220 mg total) by mouth daily. 30 capsule 0   No current facility-administered medications on file prior to visit.    There are no Patient Instructions on file for this visit. No follow-ups on file.   Tiwanda Threats E Elaijah Munoz, NP

## 2024-03-20 NOTE — Progress Notes (Addendum)
 Subjective:    Patient ID: Daniel Ritter, male    DOB: 1976-01-21, 48 y.o.   MRN: [id number] Chief Complaint  Patient presents with   New Patient (Initial Visit)    Ref Neill consult PAD    The patient is seen for evaluation of painful lower extremities and diminished pulses associated with ulceration of the foot.  The patient notes the ulcer has been present for multiple months and slowly improving.  It is very painful and has had some drainage.  No specific history of trauma noted by the patient.  The patient denies fever or chills.  the patient does have diabetes which has been difficult to control.  Patient notes prior to the ulcer developing the extremities were painful particularly with walking.  The patient denies rest pain or dangling of an extremity off the side of the bed during the night for relief.  No history of back problems or DJD of the lumbar sacral spine.   The patient denies amaurosis fugax or recent TIA symptoms. There are no recent neurological changes noted. The patient denies history of DVT, PE or superficial thrombophlebitis. The patient denies recent episodes of angina or shortness of breath.   The patient had an ABI that is noncompressible on the right with a diminished TBI of 0.53 with an ABI of 1.16 on the left.  There is monophasic waveforms with dampened toe waveforms on the right with multiphasic waveforms and good toe waveforms on the left    Review of Systems  Skin:  Positive for wound.  All other systems reviewed and are negative.      Objective:   Physical Exam Vitals reviewed.  HENT:     Head: Normocephalic.  Cardiovascular:     Rate and Rhythm: Normal rate.     Pulses:          Dorsalis pedis pulses are detected w/ Doppler on the right side and detected w/ Doppler on the left side.       Posterior tibial pulses are detected w/ Doppler on the right side and detected w/ Doppler on the left side.  Pulmonary:     Effort: Pulmonary effort  is normal.  Skin:    General: Skin is warm and dry.  Neurological:     Mental Status: He is alert and oriented to person, place, and time.     Gait: Gait abnormal.  Psychiatric:        Mood and Affect: Mood normal.        Behavior: Behavior normal.        Thought Content: Thought content normal.        Judgment: Judgment normal.     BP 137/89   Pulse 76   Resp 18   Ht 6' 1 (1.854 m)   Wt 247 lb (112 kg)   BMI 32.59 kg/m   Past Medical History:  Diagnosis Date   Allergy    seasonal   Anxiety    Diabetes mellitus without complication (HCC)    GERD (gastroesophageal reflux disease)    H/O ETOH abuse    History of rectal abscess    Hypertension 2007   Neuropathy    Pancreatitis    history of, pt states due to alcohol   Seizures (HCC) 08/2014    two seizures in the hospital   Stroke [facility]) 08/31/2013   Thrombocytopenia [facility])     Social History   Socioeconomic History   Marital status: Divorced    Spouse  name: Not on file   Number of children: 0   Years of education: 14   Highest education level: Not on file  Occupational History   Occupation: Event organiser: [employer]   Occupation: diabled  Tobacco Use   Smoking status: Former    Current packs/day: 0.00    Average packs/day: 0.3 packs/day for 18.0 years (4.5 ttl pk-yrs)    Types: Cigarettes    Start date: 04/11/2002    Quit date: 04/11/2020    Years since quitting: 3.9   Smokeless tobacco: Former    Types: Snuff    Quit date: 2001   Tobacco comments:    patient quit for 3 years and then started back . 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[place], no children   Right handed   caffeine use -  2 cups coffee daily, usually decaf   Social Drivers of Health   Financial Resource Strain: High Risk (06/17/2023)   Received from [facility]   Overall Financial Resource Strain (CARDIA)    Difficulty of Paying Living Expenses: Very hard  Food Insecurity: Food Insecurity Present (06/17/2023)   Received from [facility]   Hunger Vital Sign    Within the past 12 months, you worried that your food would run out before you got the money to buy more.: Sometimes true    Within the past 12 months, the food you bought just didn't last and you didn't have money to get more.: Sometimes true  Transportation Needs: Unmet Transportation Needs (06/17/2023)   Received from [facility] - Transportation    In the past 12 months, has lack of transportation kept you from medical appointments or from getting medications?: Yes    Lack of Transportation (Non-Medical): Yes  Physical Activity: Unknown (06/17/2023)   Received from [facility]   Exercise Vital Sign    On average, how many days per week do you engage in moderate to strenuous exercise (like a brisk walk)?: 6 days    Minutes of Exercise per Session: Not on file  Stress: Stress Concern Present (06/17/2023)   Received from [facility] - Occupational Stress Questionnaire    Feeling of Stress : Rather much  Social Connections: Moderately Isolated (06/17/2023)   Received from [facility]   Social Connection and Isolation Panel    In a typical week, how many times do you talk on the phone with family, friends, or neighbors?: More than three times a week    How often do you get together with friends or relatives?: Three times a week    How often do you attend church or religious services?: 1  to 4 times per year    Do you belong to any clubs or organizations such as church groups, unions, fraternal or athletic groups, or school groups?: No    How  often do you attend meetings of the clubs or organizations you belong to?: Never    Are you married, widowed, divorced, separated, never married, or living with a partner?: Divorced  Intimate Partner Violence: Not At Risk (01/13/2023)   Humiliation, Afraid, Rape, and Kick questionnaire    Fear of Current or Ex-Partner: No    Emotionally Abused: No    Physically Abused: No    Sexually Abused: No    Past Surgical History:  Procedure Laterality Date   AMPUTATION TOE Right 01/30/2022   Procedure: PARTIAL 5TH RAY AMUPTATION RIGHT FOOT;  Surgeon: Ashley Soulier, DPM;  Location: [facility] ORS;  Service: Podiatry;  Laterality: Right;   ANTERIOR CRUCIATE LIGAMENT REPAIR Left 1992, 1993   COLONOSCOPY N/A 09/04/2021   Procedure: COLONOSCOPY;  Surgeon: Toledo, Ladell POUR, MD;  Location: [facility] ENDOSCOPY;  Service: Gastroenterology;  Laterality: N/A;  DM   INCISION AND DRAINAGE PERIRECTAL ABSCESS N/A 12/14/2015   Procedure: IRRIGATION AND DEBRIDEMENT PERIRECTAL ABSCESS;  Surgeon: Louanne KANDICE Muse, MD;  Location: [facility] ORS;  Service: General;  Laterality: N/A;   IRRIGATION AND DEBRIDEMENT FOOT Bilateral 01/30/2022   Procedure: IRRIGATION AND DEBRIDEMENT TENDON ULCER LEFT FOOT,  EXCISION OF 2ND TOE JOINT LEFT FOOT, IRRIGATION AND DEBRIDEMENT SUBCUTANEOUS ULCER RIGHT FOOT;  Surgeon: Ashley Soulier, DPM;  Location: [facility] ORS;  Service: Podiatry;  Laterality: Bilateral;   IRRIGATION AND DEBRIDEMENT FOOT Right 05/01/2022   Procedure: IRRIGATION AND DEBRIDEMENT FOOT;SOFT TISSUE AND BONE;  Surgeon: Neill Boas, DPM;  Location: [facility] ORS;  Service: Podiatry;  Laterality: Right;   LOWER EXTREMITY ANGIOGRAPHY Right 02/04/2022   Procedure: Lower Extremity Angiography;  Surgeon: Jama Cordella KANDICE, MD;  Location: [facility] IN[facility];  Service: Cardiovascular;  Laterality:  Right;    Family History  Problem Relation Age of Onset   Hypertension Mother    Anxiety disorder Mother    Hypertension Father    Hypertension Brother    Leukemia Maternal Grandfather    Lung cancer Paternal Grandfather     No Known Allergies     Latest Ref Rng & Units 02/03/2024    2:04 PM 01/14/2023    4:16 AM 01/13/2023    4:15 AM  CBC  WBC 4.0 - 10.5 K/uL 9.7  7.1  7.9   Hemoglobin 13.0 - 17.0 g/dL 85.6  86.4  88.0   Hematocrit 39.0 - 52.0 % 40.5  38.3  35.2   Platelets 150 - 400 K/uL 154  113  95       CMP     Component Value Date/Time   NA 135 02/03/2024 1404   NA 140 11/08/2014 0851   NA 136 09/20/2011 0727   K 4.4 02/03/2024 1404   K 4.0 10/10/2011 1407   CL 101 02/03/2024 1404   CL 103 09/20/2011 0727   CO2 24 02/03/2024 1404   CO2 21 09/20/2011 0727   GLUCOSE 184 (H) 02/03/2024 1404   GLUCOSE 153 (H) 09/20/2011 0727   BUN 14 02/03/2024 1404   BUN 13 11/08/2014 0851   BUN 11 09/20/2011 0727   CREATININE 0.84 02/03/2024 1404   CREATININE 0.75 10/10/2011 1407   CALCIUM 9.2 02/03/2024 1404   CALCIUM 9.2 09/20/2011 0727   PROT 7.7 02/03/2024 1404   PROT 8.9 (H) 09/17/2011 1058   ALBUMIN 4.1 02/03/2024 1404   ALBUMIN 4.3 09/17/2011 1058   AST 34 02/03/2024 1404   AST 10 (L) 09/17/2011 1058   ALT 40 02/03/2024 1404   ALT 25 09/17/2011 1058   ALKPHOS  59 02/03/2024 1404   ALKPHOS 132 09/17/2011 1058   BILITOT 0.6 02/03/2024 1404   BILITOT 0.6 09/17/2011 1058   GFRNONAA >60 02/03/2024 1404   GFRNONAA >60 10/10/2011 1407     VAS US  ABI WITH/WO TBI Result Date: 03/16/2024  LOWER EXTREMITY DOPPLER STUDY Patient Name:  JAMIE BELGER  Date of Exam:   03/14/2024 Medical Rec #: [id number]        Accession #:    [id number] Date of Birth: Nov 18, 1975        Patient Gender: M Patient Age:   82 years Exam Location:  [facility] Procedure:      VAS US  ABI WITH/WO TBI Referring Phys:  --------------------------------------------------------------------------------  Indications: Peripheral artery disease.  Performing Technologist: Jerel Croak RVT  Examination Guidelines: A complete evaluation includes at minimum, Doppler waveform signals and systolic blood pressure reading at the level of bilateral brachial, anterior tibial, and posterior tibial arteries, when vessel segments are accessible. Bilateral testing is considered an integral part of a complete examination. Photoelectric Plethysmograph (PPG) waveforms and toe systolic pressure readings are included as required and additional duplex testing as needed. Limited examinations for reoccurring indications may be performed as noted.  ABI Findings: +---------+------------------+-----+----------+--------+ Right    Rt Pressure (mmHg)IndexWaveform  Comment  +---------+------------------+-----+----------+--------+ Brachial 137                                       +---------+------------------+-----+----------+--------+ PTA      255               1.86 monophasic         +---------+------------------+-----+----------+--------+ DP       130               0.95 monophasic         +---------+------------------+-----+----------+--------+ Great Toe72                0.53 Abnormal           +---------+------------------+-----+----------+--------+ +---------+------------------+-----+---------+-------+ Left     Lt Pressure (mmHg)IndexWaveform Comment +---------+------------------+-----+---------+-------+ Brachial 135                                     +---------+------------------+-----+---------+-------+ PTA      158               1.15 triphasic        +---------+------------------+-----+---------+-------+ DP       159               1.16 biphasic         +---------+------------------+-----+---------+-------+ Burnetta Number               0.93 Normal            +---------+------------------+-----+---------+-------+  Summary: Right: Resting right ankle-brachial index indicates noncompressible right lower extremity arteries. The right toe-brachial index is abnormal.  Left: Resting left ankle-brachial index is within normal range. The left toe-brachial index is normal.  *See table(s) above for measurements and observations.  Electronically signed by Selinda Gu MD on 03/16/2024 at 7:25:18 AM.    Final        Assessment & Plan:   1. Atherosclerosis of native arteries of the extremities with ulceration (HCC) (Primary)  Recommend:  The patient has evidence of severe atherosclerotic changes of both lower extremities  associated with ulceration and tissue loss of the right foot.  This represents a limb threatening ischemia and places the patient at the risk for left limb loss.  Patient should undergo angiography of the right lower extremity with the hope for intervention for limb salvage.  The risks and benefits as well as the alternative therapies was discussed in detail with the patient.  All questions were answered.  Patient agrees to proceed with left lower extremity angiography.  The patient will follow up with me in the office after the procedure.   2. Essential hypertension Continue antihypertensive medications as already ordered, these medications have been reviewed and there are no changes at this time.  3. Type 2 diabetes mellitus with hyperosmolarity without coma, unspecified whether long term insulin  use (HCC) Continue hypoglycemic medications as already ordered, these medications have been reviewed and there are no changes at this time.  Hgb A1C to be monitored as already arranged by primary service   Current Outpatient Medications on File Prior to Visit  Medication Sig Dispense Refill   acetaminophen  (TYLENOL ) 325 MG tablet Take 2 tablets (650 mg total) by mouth every 6 (six) hours as needed for mild pain (or Fever >/= 101).     ALPRAZolam   (XANAX ) 0.25 MG tablet Take 0.25 mg by mouth 2 (two) times daily. (Patient taking differently: Take 0.25 mg by mouth 2 (two) times daily as needed.)     amLODipine  (NORVASC ) 10 MG tablet Take 10 mg by mouth daily as needed.     ascorbic acid  (VITAMIN C ) 500 MG tablet Take 1 tablet (500 mg total) by mouth 2 (two) times daily. 30 tablet 0   atorvastatin (LIPITOR) 20 MG tablet Take 20 mg by mouth daily.     chlorhexidine  (PERIDEX ) 0.12 % solution Use as directed 15 mLs in the mouth or throat 2 (two) times daily.     DULoxetine  (CYMBALTA ) 30 MG capsule Take 30 mg by mouth daily.     JANUVIA 100 MG tablet Take 100 mg by mouth daily.     levETIRAcetam  (KEPPRA ) 500 MG tablet Take 1 tablet (500 mg total) by mouth 2 (two) times daily. 30 tablet 2   lisinopril -hydrochlorothiazide  (ZESTORETIC ) 20-25 MG tablet Take 1 tablet by mouth daily.     metFORMIN  (GLUCOPHAGE ) 1000 MG tablet Take 1,000 mg by mouth 2 (two) times daily with a meal.     metoprolol  succinate (TOPROL -XL) 100 MG 24 hr tablet Take 100 mg by mouth daily. Take with or immediately following a meal.     Multiple Vitamins-Minerals (MULTIVITAMIN WITH MINERALS) tablet Take 1 tablet by mouth daily.     omeprazole (PRILOSEC) 20 MG capsule Take 20 mg by mouth 2 (two) times daily before a meal.     triamcinolone  cream (KENALOG ) 0.5 % Apply topically 2 (two) times daily. 30 g 0   zinc  sulfate 220 (50 Zn) MG capsule Take 1 capsule (220 mg total) by mouth daily. 30 capsule 0   No current facility-administered medications on file prior to visit.    There are no Patient Instructions on file for this visit. No follow-ups on file.   Tiwanda Threats E Elaijah Munoz, NP

## 2024-03-22 ENCOUNTER — Ambulatory Visit
Admission: RE | Admit: 2024-03-22 | Discharge: 2024-03-22 | Disposition: A | Discharge status: Home / Self Care | Attending: Vascular Surgery | Admitting: Vascular Surgery

## 2024-03-22 ENCOUNTER — Encounter
Admission: RE | Disposition: A | Discharge status: Home / Self Care | Payer: Self-pay | Source: Home / Self Care | Attending: Vascular Surgery

## 2024-03-22 ENCOUNTER — Other Ambulatory Visit: Payer: Self-pay

## 2024-03-22 ENCOUNTER — Encounter: Payer: Self-pay | Admitting: Vascular Surgery

## 2024-03-22 DIAGNOSIS — E11621 Type 2 diabetes mellitus with foot ulcer: Secondary | ICD-10-CM | POA: Diagnosis not present

## 2024-03-22 DIAGNOSIS — L97511 Non-pressure chronic ulcer of other part of right foot limited to breakdown of skin: Secondary | ICD-10-CM | POA: Insufficient documentation

## 2024-03-22 DIAGNOSIS — I10 Essential (primary) hypertension: Secondary | ICD-10-CM | POA: Diagnosis not present

## 2024-03-22 DIAGNOSIS — I70299 Other atherosclerosis of native arteries of extremities, unspecified extremity: Secondary | ICD-10-CM | POA: Diagnosis present

## 2024-03-22 DIAGNOSIS — E1100 Type 2 diabetes mellitus with hyperosmolarity without nonketotic hyperglycemic-hyperosmolar coma (NKHHC): Secondary | ICD-10-CM | POA: Insufficient documentation

## 2024-03-22 DIAGNOSIS — L97909 Non-pressure chronic ulcer of unspecified part of unspecified lower leg with unspecified severity: Secondary | ICD-10-CM

## 2024-03-22 DIAGNOSIS — I70233 Atherosclerosis of native arteries of right leg with ulceration of ankle: Secondary | ICD-10-CM | POA: Diagnosis not present

## 2024-03-22 DIAGNOSIS — I70235 Atherosclerosis of native arteries of right leg with ulceration of other part of foot: Secondary | ICD-10-CM | POA: Insufficient documentation

## 2024-03-22 DIAGNOSIS — Z5986 Financial insecurity: Secondary | ICD-10-CM | POA: Diagnosis not present

## 2024-03-22 DIAGNOSIS — Z5941 Food insecurity: Secondary | ICD-10-CM | POA: Diagnosis not present

## 2024-03-22 DIAGNOSIS — Z7984 Long term (current) use of oral hypoglycemic drugs: Secondary | ICD-10-CM | POA: Diagnosis not present

## 2024-03-22 DIAGNOSIS — L97319 Non-pressure chronic ulcer of right ankle with unspecified severity: Secondary | ICD-10-CM

## 2024-03-22 DIAGNOSIS — M869 Osteomyelitis, unspecified: Secondary | ICD-10-CM

## 2024-03-22 DIAGNOSIS — Z87891 Personal history of nicotine dependence: Secondary | ICD-10-CM | POA: Diagnosis not present

## 2024-03-22 LAB — CREATININE, SERUM
Creatinine, Ser: 0.79 mg/dL (ref 0.61–1.24)
GFR, Estimated: >60 mL/min (ref >60)

## 2024-03-22 LAB — BUN: BUN: 9 mg/dL (ref 6–20)

## 2024-03-22 LAB — GLUCOSE, CAPILLARY: Glucose-Capillary: 147 mg/dL — ABNORMAL HIGH (ref 70–99)

## 2024-03-22 SURGERY — LOWER EXTREMITY ANGIOGRAPHY
Anesthesia: Moderate Sedation | Site: Leg Lower | Laterality: Right

## 2024-03-22 MED ORDER — PANTOPRAZOLE SODIUM 40 MG PO TBEC: 40.0000 mg | DELAYED_RELEASE_TABLET | Freq: Every day | ORAL | 11 refills | Status: DC

## 2024-03-22 MED ORDER — ACETAMINOPHEN 325 MG PO TABS: 650.0000 mg | ORAL_TABLET | ORAL | Status: DC | PRN

## 2024-03-22 MED ORDER — OXYCODONE HCL 5 MG PO TABS: 5.0000 mg | ORAL_TABLET | ORAL | Status: DC | PRN

## 2024-03-22 MED ORDER — MIDAZOLAM HCL 5 MG/5ML IJ SOLN
INTRAMUSCULAR | Status: AC
  Filled 2024-03-22: qty 5

## 2024-03-22 MED ORDER — HYDRALAZINE HCL 20 MG/ML IJ SOLN: 5.0000 mg | INTRAMUSCULAR | Status: DC | PRN

## 2024-03-22 MED ORDER — FAMOTIDINE 20 MG PO TABS: 40.0000 mg | ORAL_TABLET | Freq: Once | ORAL | Status: DC | PRN

## 2024-03-22 MED ORDER — HYDROMORPHONE HCL 1 MG/ML IJ SOLN: 1.0000 mg | Freq: Once | INTRAMUSCULAR | Status: DC | PRN

## 2024-03-22 MED ORDER — METHYLPREDNISOLONE SODIUM SUCC 125 MG IJ SOLR: 125.0000 mg | Freq: Once | INTRAMUSCULAR | Status: DC | PRN

## 2024-03-22 MED ORDER — SODIUM CHLORIDE 0.9 % IV SOLN: 250.0000 mL | INTRAVENOUS | Status: DC | PRN

## 2024-03-22 MED ORDER — HEPARIN SODIUM (PORCINE) 1000 UNIT/ML IJ SOLN
INTRAMUSCULAR | Status: DC | PRN
  Administered 2024-03-22: 6000 [IU] via INTRAVENOUS

## 2024-03-22 MED ORDER — ASPIRIN 81 MG PO TBEC: 81.0000 mg | DELAYED_RELEASE_TABLET | Freq: Every day | ORAL | 1 refills | Status: DC

## 2024-03-22 MED ORDER — ONDANSETRON HCL 4 MG/2ML IJ SOLN: 4.0000 mg | Freq: Four times a day (QID) | INTRAMUSCULAR | Status: DC | PRN

## 2024-03-22 MED ORDER — MIDAZOLAM HCL 2 MG/2ML IJ SOLN
INTRAMUSCULAR | Status: AC
  Filled 2024-03-22: qty 2

## 2024-03-22 MED ORDER — SODIUM CHLORIDE 0.9% FLUSH: 3.0000 mL | Freq: Two times a day (BID) | INTRAVENOUS | Status: DC

## 2024-03-22 MED ORDER — HEPARIN (PORCINE) IN NACL 1000-0.9 UT/500ML-% IV SOLN
INTRAVENOUS | Status: DC | PRN
  Administered 2024-03-22: 1000 mL

## 2024-03-22 MED ORDER — SODIUM CHLORIDE 0.9 % IV SOLN: INTRAVENOUS | Status: DC

## 2024-03-22 MED ORDER — MIDAZOLAM HCL 2 MG/2ML IJ SOLN
INTRAMUSCULAR | Status: DC | PRN
  Administered 2024-03-22: 2 mg via INTRAVENOUS
  Administered 2024-03-22 (×4): 1 mg via INTRAVENOUS

## 2024-03-22 MED ORDER — MORPHINE SULFATE (PF) 2 MG/ML IV SOLN: 2.0000 mg | INTRAVENOUS | Status: DC | PRN

## 2024-03-22 MED ORDER — CEFAZOLIN SODIUM-DEXTROSE 2-4 GM/100ML-% IV SOLN
2.0000 g | INTRAVENOUS | Status: AC
  Administered 2024-03-22: 2 g via INTRAVENOUS

## 2024-03-22 MED ORDER — CLOPIDOGREL BISULFATE 75 MG PO TABS: 75.0000 mg | ORAL_TABLET | Freq: Every day | ORAL | 5 refills | Status: DC

## 2024-03-22 MED ORDER — DIPHENHYDRAMINE HCL 50 MG/ML IJ SOLN: 50.0000 mg | Freq: Once | INTRAMUSCULAR | Status: DC | PRN

## 2024-03-22 MED ORDER — FENTANYL CITRATE PF 50 MCG/ML IJ SOSY
PREFILLED_SYRINGE | INTRAMUSCULAR | Status: AC
  Filled 2024-03-22: qty 2

## 2024-03-22 MED ORDER — SODIUM CHLORIDE 0.9% FLUSH: 3.0000 mL | INTRAVENOUS | Status: DC | PRN

## 2024-03-22 MED ORDER — MIDAZOLAM HCL 2 MG/ML PO SYRP: 8.0000 mg | ORAL_SOLUTION | Freq: Once | ORAL | Status: DC | PRN

## 2024-03-22 MED ORDER — HEPARIN SODIUM (PORCINE) 1000 UNIT/ML IJ SOLN
INTRAMUSCULAR | Status: AC
Start: 2024-03-22 — End: 2024-03-22
  Filled 2024-03-22: qty 10

## 2024-03-22 MED ORDER — LABETALOL HCL 5 MG/ML IV SOLN: 10.0000 mg | INTRAVENOUS | Status: DC | PRN

## 2024-03-22 MED ORDER — FENTANYL CITRATE (PF) 100 MCG/2ML IJ SOLN
INTRAMUSCULAR | Status: DC | PRN
  Administered 2024-03-22 (×2): 25 ug via INTRAVENOUS
  Administered 2024-03-22: 50 ug via INTRAVENOUS
  Administered 2024-03-22: 25 ug via INTRAVENOUS
  Administered 2024-03-22: 50 ug via INTRAVENOUS

## 2024-03-22 MED ORDER — LIDOCAINE HCL (PF) 1 % IJ SOLN
INTRAMUSCULAR | Status: DC | PRN
  Administered 2024-03-22: 10 mL

## 2024-03-22 MED ORDER — IODIXANOL 320 MG/ML IV SOLN
INTRAVENOUS | Status: DC | PRN
  Administered 2024-03-22: 70 mL

## 2024-03-22 MED ORDER — CEFAZOLIN SODIUM-DEXTROSE 2-4 GM/100ML-% IV SOLN
INTRAVENOUS | Status: AC
  Filled 2024-03-22: qty 100

## 2024-03-22 MED ORDER — CLOPIDOGREL BISULFATE 300 MG PO TABS: 300.0000 mg | ORAL_TABLET | ORAL | Status: DC

## 2024-03-22 SURGICAL SUPPLY — 23 items
BALLOON ARMADA 2.5X40X150 (BALLOONS) IMPLANT
BALLOON JADE .014 4.0 X 40 0 (BALLOONS) IMPLANT
BALLOON LUTONIX 018 4X100X130 (BALLOONS) IMPLANT
CATH ANGIO 5F PIGTAIL 65CM (CATHETERS) IMPLANT
CATH BEACON TIP VERT 5FR 125 (CATHETERS) IMPLANT
CATH ROTAREX 135 6FR (CATHETERS) IMPLANT
COVER PROBE ULTRASOUND 5X96 (MISCELLANEOUS) IMPLANT
DEVICE PRESTO INFLATION (MISCELLANEOUS) IMPLANT
DEVICE STARCLOSE SE CLOSURE (Vascular Products) IMPLANT
GLIDEWIRE ADV .014X300CM (WIRE) IMPLANT
GLIDEWIRE ADV .035X260CM (WIRE) IMPLANT
GOWN STRL REUS W/ TWL LRG LVL3 (GOWN DISPOSABLE) ×1 IMPLANT
NDL ENTRY 21GA 7CM ECHOTIP (NEEDLE) IMPLANT
NEEDLE ENTRY 21GA 7CM ECHOTIP (NEEDLE) ×1 IMPLANT
PACK ANGIOGRAPHY (CUSTOM PROCEDURE TRAY) ×1 IMPLANT
SET INTRO CAPELLA COAXIAL (SET/KITS/TRAYS/PACK) IMPLANT
SHEATH BRITE TIP 5FRX11 (SHEATH) IMPLANT
SHEATH RAABE 6FR (SHEATH) IMPLANT
STENT ESPRIT BTK 3.7X38 SCAFF (Permanent Stent) IMPLANT
SYR MEDRAD MARK 7 150ML (SYRINGE) IMPLANT
TUBING CONTRAST HIGH PRESS 72 (TUBING) IMPLANT
WIRE G V18X300CM (WIRE) IMPLANT
WIRE J 3MM .035X145CM (WIRE) IMPLANT

## 2024-03-22 NOTE — Interval H&P Note (Signed)
 History and Physical Interval Note:  03/22/2024 1:24 PM  Daniel Ritter  has presented today for surgery, with the diagnosis of RLE Angio   ASO w ulceration.  The various methods of treatment have been discussed with the patient and family. After consideration of risks, benefits and other options for treatment, the patient has consented to  Procedure(s): Lower Extremity Angiography (Right) as a surgical intervention.  The patient's history has been reviewed, patient examined, no change in status, stable for surgery.  I have reviewed the patient's chart and labs.  Questions were answered to the patient's satisfaction.     Cordella Shawl

## 2024-03-22 NOTE — Op Note (Signed)
 [facility]  Percutaneous Study/Intervention Procedural Note   Date of Surgery: 03/22/2024  Surgeon:  Cordella Shawl  Pre-operative Diagnosis: Atherosclerotic occlusive disease bilateral lower extremities with right lower extremity with rest pain and ulceration.  Post-operative diagnosis:  Same  Procedure(s) Performed:             1.  Introduction catheter into right lower extremity 3rd order catheter placement               2.    Contrast injection right lower extremity for distal runoff             3.  Percutaneous transluminal angioplasty and stent placement right popliteal 4.25 mm             4.  Atherectomy right popliteal artery using the Rota Rex catheter.               5.   Percutaneous transluminal angioplasty right posterior tibial to 2.5 mm             6.  Star close closure left common femoral arteriotomy  Anesthesia: Conscious sedation was administered under my direct supervision by the interventional radiology RN. IV Versed  plus fentanyl  were utilized. Continuous ECG, pulse oximetry and blood pressure was monitored throughout the entire procedure.  Conscious sedation was for a total of 1 hour 14 minutes 45 seconds.  Sheath: 6 [place] Rabie left common femoral retrograde  Contrast: 70 cc  Fluoroscopy Time: 13.4 minutes  Indications:  [name] presents with increasing pain of the right lower extremity.  He has developed an open wound of the right ankle and has documented osteomyelitis.  This has been nonhealing.  Noninvasive studies demonstrate significant occlusive disease.  This suggests the patient is having limb threatening ischemia. The risks and benefits are reviewed all questions answered patient agrees to proceed.  Procedure:   Daniel Ritter is a 48 y.o. y.o. male who was identified and appropriate procedural time out was performed.  The patient was then placed supine on the table and prepped and draped in the usual sterile fashion.     Ultrasound was placed in the sterile sleeve and the left groin was evaluated the left common femoral artery was echolucent and pulsatile indicating patency.  Image was recorded for the permanent record and under real-time visualization a microneedle was inserted into the common femoral artery followed by the microwire and then the micro-sheath.  A J-wire was then advanced through the micro-sheath and a  5 [place] sheath was then inserted over a J-wire. J-wire was then advanced and a 5 French pigtail catheter was positioned at the level of T12.  AP projection of the aorta was then obtained. Pigtail catheter was repositioned to above the bifurcation and a Vila view of the pelvis was obtained.  Subsequently a pigtail catheter with an Advantage wire was used to cross the aortic bifurcation.  The catheter and wire were advanced down into the right distal external iliac artery. Oblique view of the femoral bifurcation was then obtained and subsequently the wire was reintroduced and the pigtail catheter negotiated into the SFA representing third order catheter placement. Distal runoff was then performed.  6000 units of heparin  was then given and allowed to circulate for several minutes.  A 6 French Rabie sheath was advanced up and over the bifurcation and positioned in the femoral artery  KMP catheter and advantage Glidewire were then negotiated down into the distal popliteal and subsequently the  anterior tibial artery. Catheter was then advanced. Hand injection contrast demonstrated the tibial anatomy in further detail.    A V18 wire was exchanged through the Kumpe catheter and the [place] Rex catheter prepped on the field.  Rota Rex catheter was then advanced down to the mid popliteal artery where the string sign began.  Total of 3 passes were made with the [place] Rex catheter.  Follow-up imaging demonstrated successful recanalization with greater than 50% residual stenosis at several spots.  A 4 mm x 100 mm Lutonix  drug-eluting balloon was then advanced across this lesion essentially the distal marker was in line with the distal popliteal artery.  Inflation was then to 10 atm for approximately 1 minute.  Follow-up imaging demonstrated greater than 50% residual stenosis in the distalmost aspect of the popliteal artery.  The V18 wire and Kumpe catheter were then used to attempt selection of the posterior tibial which demonstrated a string sign in its first 2 cm.  Ultimately I was able to negotiate a 0.014 advantage wire into the posterior tibial.  Kumpe was removed and a 2.5 mm x 40 mm [place] balloon was advanced across this lesion inflated to 10 atm for approximately 1 minute.  Follow-up imaging demonstrated less than 10% residual stenosis within the posterior tibial.  At this point I was able to address the persistent residual stenosis in the distal aspect of the popliteal artery.  A 3.75 x 38 Esprit stent was then advanced down and lined up so that it was just proximal to the origin of the posterior tibial artery.  It should be noted that the patient has aberrant anatomy with his posterior tibial being the most proximal tibial to branch from the popliteal.  The Esprit stent was then deployed to 20 atm for a maximal dimension of 4.00 mm diameter.  A 4 mm x 40 mm Jade balloon was then advanced across the stent and inflated to 20 atm for a maximal diameter of 4.25 mm.  Follow-up imaging demonstrated wide patency of the popliteal with less than 10% residual stenosis and three-vessel runoff to the foot.  After review of these images the sheath is pulled into the left external iliac oblique of the common femoral is obtained and a Star close device deployed. There no immediate Complications.  Findings:  The abdominal aorta is opacified with a bolus injection contrast. Renal arteries are single and widely patent without evidence of hemodynamically significant stenosis.  The aorta itself has diffuse disease but no  hemodynamically significant lesions. The common and external iliac arteries are widely patent bilaterally.  The right common femoral is widely patent as is the profunda femoris.  The SFA is widely patent down to Hunter's canal.  The mid to distal popliteal demonstrates increasing disease with a string sign at the level of the tibial plateau and the trifurcation is patent with the anterior tibial being the dominant runoff and the peroneal being patent as well.  The posterior tibial is the most proximal branch of the trifurcation which is unusual.  It demonstrates a string sign in its first 2 cm.  Following atherectomy there is now recanalization with a marked improvement in the lumen there is however a residual 40 to 50% stenosis in the distalmost aspect of the popliteal artery.  Following angioplasty posterior tibial now is in-line flow and looks quite nice with less than 10% residual stenosis.  Angioplasty and stent placement of the distal popliteal yields an excellent result with less than 10% residual stenosis.  Summary: Successful recanalization right lower extremity for limb salvage                           Disposition: Patient was taken to the recovery room in stable condition having tolerated the procedure well.  Cordella Jaylani Mcguinn 03/22/2024,3:08 PM

## 2024-03-23 ENCOUNTER — Encounter: Payer: Self-pay | Admitting: Vascular Surgery

## 2024-04-01 NOTE — Progress Notes (Signed)
 CC: Chief Complaint  Patient presents with  . Follow-up    Daniel Ritter presents for follow up of ulcerations on the right heel and ankle.  States the foot is feeling much better since he had revascularization procedure last week..   Objective: Continued ulcerations noted on the medial aspect of the right ankle and beneath the right heel.  Ankle ulceration measures approximately 15 mm x 10 mm with depth of 1 to 2 mm with fibrous base with no sign of infection.  Heel ulcer measures approximately 22 mm x 20 mm with depth of 2 mm with some surrounding maceration.  Again no sign of infection.  Assessment: Encounter Diagnoses  Name Primary?  Daniel Ritter Ankle ulcer, right, with fat layer exposed (CMS/HHS-HCC) Yes  . Ulcer of right heel and midfoot, limited to breakdown of skin (CMS/HHS-HCC)   . Diabetic polyneuropathy associated with type 2 diabetes mellitus (CMS/HHS-HCC)   . PAD (peripheral artery disease) ()     Plan: Minimal debridement performed around the plantar ulceration sharply using tissue nippers of the macerated tissue.  Bacitracin and gauze dressings applied to both ulcerations.  Continue his current local wound care.  Patient will return to clinic in 4 weeks for follow-up.  No orders of the defined types were placed in this encounter.

## 2024-04-13 ENCOUNTER — Other Ambulatory Visit (INDEPENDENT_AMBULATORY_CARE_PROVIDER_SITE_OTHER): Payer: Self-pay | Admitting: Vascular Surgery

## 2024-04-13 DIAGNOSIS — I739 Peripheral vascular disease, unspecified: Secondary | ICD-10-CM

## 2024-04-14 ENCOUNTER — Other Ambulatory Visit (INDEPENDENT_AMBULATORY_CARE_PROVIDER_SITE_OTHER)

## 2024-04-14 ENCOUNTER — Ambulatory Visit (INDEPENDENT_AMBULATORY_CARE_PROVIDER_SITE_OTHER): Admitting: Nurse Practitioner

## 2024-04-14 ENCOUNTER — Encounter (INDEPENDENT_AMBULATORY_CARE_PROVIDER_SITE_OTHER): Payer: Self-pay | Admitting: Nurse Practitioner

## 2024-04-14 VITALS — BP 157/94 | HR 91 | Resp 18 | Wt 253.2 lb

## 2024-04-14 DIAGNOSIS — I739 Peripheral vascular disease, unspecified: Secondary | ICD-10-CM | POA: Diagnosis not present

## 2024-04-14 DIAGNOSIS — E1100 Type 2 diabetes mellitus with hyperosmolarity without nonketotic hyperglycemic-hyperosmolar coma (NKHHC): Secondary | ICD-10-CM | POA: Diagnosis not present

## 2024-04-14 DIAGNOSIS — I10 Essential (primary) hypertension: Secondary | ICD-10-CM

## 2024-04-14 DIAGNOSIS — I7025 Atherosclerosis of native arteries of other extremities with ulceration: Secondary | ICD-10-CM | POA: Diagnosis not present

## 2024-04-14 DIAGNOSIS — Z9889 Other specified postprocedural states: Secondary | ICD-10-CM | POA: Diagnosis not present

## 2024-04-14 NOTE — Progress Notes (Unsigned)
 Subjective:    Patient ID: Daniel Ritter, male    DOB: [birth year], 48 y.o.   MRN: [id number] Chief Complaint  Patient presents with  . Follow-up    [facility] 3 week with abi    The patient returns to the office for followup and review status post angiogram with intervention on 03/22/2024.   Procedure: Procedure(s) Performed:             1.  Introduction catheter into right lower extremity 3rd order catheter placement               2.    Contrast injection right lower extremity for distal runoff             3.  Percutaneous transluminal angioplasty and stent placement right popliteal 4.25 mm             4.  Atherectomy right popliteal artery using the [place] catheter.               5.   Percutaneous transluminal angioplasty right posterior tibial to 2.5 mm             6.  Star close closure left common femoral arteriotomy   The patient notes improvement in the lower extremity symptoms. No interval shortening of the patient's claudication distance or rest pain symptoms. No new ulcers or wounds have occurred since the last visit.  There have been no significant changes to the patient's overall health care.  No documented history of amaurosis fugax or recent TIA symptoms. There are no recent neurological changes noted. No documented history of DVT, PE or superficial thrombophlebitis. The patient denies recent episodes of angina or shortness of breath.   ABI's Rt=[place] and Lt=[place]  (previous ABI's Rt=[place] and Lt=1.16) Duplex US  of the *** lower extremity arterial system shows ***    Review of Systems     Objective:   Physical Exam  BP (!) 157/94   Pulse 91   Resp 18   Wt 253 lb 3.2 oz (114.9 kg)   BMI 33.41 kg/m   Past Medical History:  Diagnosis Date  . Allergy    seasonal  . Anxiety   . Diabetes mellitus without complication (HCC)   . GERD (gastroesophageal reflux disease)   . H/O ETOH abuse   . History of rectal abscess   . Hypertension 2007  . Neuropathy   .  Pancreatitis    history of, pt states due to alcohol  . Seizures (HCC) 08/2014    two seizures in the hospital  . Stroke [facility]) 08/31/2013  . Thrombocytopenia     Social History   Socioeconomic History  . Marital status: Divorced    Spouse name: Not on file  . Number of children: 0  . Years of education: 42  . Highest education level: Not on file  Occupational History  . Occupation: Event organiser: [employer]   . Occupation: diabled  Tobacco Use  . Smoking status: Former    Current packs/day: 0.00    Average packs/day: 0.3 packs/day for 18.0 years (4.5 ttl pk-yrs)    Types: Cigarettes    Start date: 04/11/2002    Quit date: 04/11/2020    Years since quitting: 4.0  . Smokeless tobacco: Former    Types: Snuff    Quit date: 2001  . Tobacco comments:    Patient hasn't smoked in years.  Vaping Use  . Vaping status: Never Used  Substance and Sexual  Activity  . Alcohol use: Not Currently    Comment: hasn't drank it about 2 years  . Drug use: Not Currently    Types: Marijuana    Comment: gummies daily for pain  . Sexual activity: Not on file  Other Topics Concern  . 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 Difficulty of Paying Living Expenses: Not hard at all  Food Insecurity: No Food Insecurity (04/01/2024)   Received from [facility]   Hunger Vital Sign   . Within the past 12 months, you worried that your food would run out before you got the money to buy more.: Never true   . Within the past 12 months, the food you bought just didn't last and you didn't have money to get more.: Never true  Transportation Needs: No Transportation Needs  (04/01/2024)   Received from [facility]   [facility] - Transportation   . In the past 12 months, has lack of transportation kept you from medical appointments or from getting medications?: No   . Lack of Transportation (Non-Medical): No  Physical Activity: Unknown (06/17/2023)   Received from [facility]   Exercise Vital Sign   . On average, how many days per week do you engage in moderate to strenuous exercise (like a brisk walk)?: 6 days   . Minutes of Exercise per Session: Not on file  Stress: Stress Concern Present (06/17/2023)   Received from [facility] - Occupational Stress Questionnaire   . Feeling of Stress : Rather much  Social Connections: Moderately Isolated (06/17/2023)   Received from [facility]   Social Connection and Isolation Panel   . In a typical week, how many times do you talk on the phone with family, friends, or neighbors?: More than three times a week   . How often do you get together with friends or relatives?: Three times a week   . How often do you attend church or religious services?: 1 to 4 times per year   . Do you belong to any clubs or organizations such as church groups, unions, fraternal or athletic groups, or school groups?: No   . How often do you attend meetings of the clubs or organizations you belong to?: Never   . Are you married, widowed, divorced, separated, never married, or living with a partner?: Divorced  Intimate Partner Violence: Not At Risk (01/13/2023)   Humiliation, Afraid, Rape, and Kick questionnaire   . Fear of Current or Ex-Partner: No   . Emotionally Abused: No   . Physically Abused: No   . Sexually Abused: No    Past Surgical History:  Procedure Laterality Date  . AMPUTATION TOE Right 01/30/2022   Procedure: PARTIAL 5TH RAY AMUPTATION RIGHT FOOT;  Surgeon: Ashley Soulier, DPM;  Location: [facility] ORS;  Service: Podiatry;   Laterality: Right;  . ANTERIOR CRUCIATE LIGAMENT REPAIR Left 1992, 1993  . COLONOSCOPY N/A 09/04/2021   Procedure: COLONOSCOPY;  Surgeon: Toledo, Ladell POUR, MD;  Location: [facility] ENDOSCOPY;  Service: Gastroenterology;  Laterality: N/A;  DM  . INCISION AND DRAINAGE  PERIRECTAL ABSCESS N/A 12/14/2015   Procedure: IRRIGATION AND DEBRIDEMENT PERIRECTAL ABSCESS;  Surgeon: Louanne KANDICE Muse, MD;  Location: [facility] ORS;  Service: General;  Laterality: N/A;  . IRRIGATION AND DEBRIDEMENT FOOT Bilateral 01/30/2022   Procedure: IRRIGATION AND DEBRIDEMENT TENDON ULCER LEFT FOOT,  EXCISION OF 2ND TOE JOINT LEFT FOOT, IRRIGATION AND DEBRIDEMENT SUBCUTANEOUS ULCER RIGHT FOOT;  Surgeon: Ashley Soulier, DPM;  Location: [facility] ORS;  Service: Podiatry;  Laterality: Bilateral;  . IRRIGATION AND DEBRIDEMENT FOOT Right 05/01/2022   Procedure: IRRIGATION AND DEBRIDEMENT FOOT;SOFT TISSUE AND BONE;  Surgeon: Neill Boas, DPM;  Location: [facility] ORS;  Service: Podiatry;  Laterality: Right;  . LOWER EXTREMITY ANGIOGRAPHY Right 02/04/2022   Procedure: Lower Extremity Angiography;  Surgeon: Jama Cordella KANDICE, MD;  Location: [facility] INVASIVE CV LAB;  Service: Cardiovascular;  Laterality: Right;  . LOWER EXTREMITY ANGIOGRAPHY Right 03/22/2024   Procedure: Lower Extremity Angiography;  Surgeon: Jama Cordella KANDICE, MD;  Location: [facility] INVASIVE CV LAB;  Service: Cardiovascular;  Laterality: Right;    Family History  Problem Relation Age of Onset  . Hypertension Mother   . Anxiety disorder Mother   . Hypertension Father   . Hypertension Brother   . Leukemia Maternal Grandfather   . Lung cancer Paternal Grandfather     No Known Allergies     Latest Ref Rng & Units 02/03/2024    2:04 PM 01/14/2023    4:16 AM 01/13/2023    4:15 AM  CBC  WBC 4.0 - 10.5 K/uL 9.7  7.1  7.9   Hemoglobin 13.0 - 17.0 g/dL 85.6  86.4  88.0   Hematocrit 39.0 - 52.0 % 40.5  38.3  35.2   Platelets 150 - 400 K/uL 154  113  95       CMP     Component Value  Date/Time   NA 135 02/03/2024 1404   NA 140 11/08/2014 0851   NA 136 09/20/2011 0727   K 4.4 02/03/2024 1404   K 4.0 10/10/2011 1407   CL 101 02/03/2024 1404   CL 103 09/20/2011 0727   CO2 24 02/03/2024 1404   CO2 21 09/20/2011 0727   GLUCOSE 184 (H) 02/03/2024 1404   GLUCOSE 153 (H) 09/20/2011 0727   BUN 9 03/22/2024 1241   BUN 13 11/08/2014 0851   BUN 11 09/20/2011 0727   CREATININE 0.79 03/22/2024 1241   CREATININE 0.75 10/10/2011 1407   CALCIUM 9.2 02/03/2024 1404   CALCIUM 9.2 09/20/2011 0727   PROT 7.7 02/03/2024 1404   PROT 8.9 (H) 09/17/2011 1058   ALBUMIN 4.1 02/03/2024 1404   ALBUMIN 4.3 09/17/2011 1058   AST 34 02/03/2024 1404   AST 10 (L) 09/17/2011 1058   ALT 40 02/03/2024 1404   ALT 25 09/17/2011 1058   ALKPHOS 59 02/03/2024 1404   ALKPHOS 132 09/17/2011 1058   BILITOT 0.6 02/03/2024 1404   BILITOT 0.6 09/17/2011 1058   GFRNONAA >60 03/22/2024 1241   GFRNONAA >60 10/10/2011 1407     VAS US  ABI WITH/WO TBI Result Date: 03/16/2024  LOWER EXTREMITY DOPPLER STUDY Patient Name:  Daniel Ritter  Date of Exam:   03/14/2024 Medical Rec #: [id number]        Accession #:    [id number] Date of Birth: February 26, 1976        Patient Gender: M Patient Age:   65 years Exam Location:  [facility] Procedure:      VAS US  ABI WITH/WO TBI Referring Phys: --------------------------------------------------------------------------------  Indications: Peripheral  artery disease.  Performing Technologist: Jerel Croak RVT  Examination Guidelines: A complete evaluation includes at minimum, Doppler waveform signals and systolic blood pressure reading at the level of bilateral brachial, anterior tibial, and posterior tibial arteries, when vessel segments are accessible. Bilateral testing is considered an integral part of a complete examination. Photoelectric Plethysmograph (PPG) waveforms and toe systolic pressure readings are included as required and additional duplex testing as needed.  Limited examinations for reoccurring indications may be performed as noted.  ABI Findings: +---------+------------------+-----+----------+--------+ Right    Rt Pressure (mmHg)IndexWaveform  Comment  +---------+------------------+-----+----------+--------+ Brachial 137                                       +---------+------------------+-----+----------+--------+ PTA      255               1.86 monophasic         +---------+------------------+-----+----------+--------+ DP       130               0.95 monophasic         +---------+------------------+-----+----------+--------+ Great Toe72                0.53 Abnormal           +---------+------------------+-----+----------+--------+ +---------+------------------+-----+---------+-------+ Left     Lt Pressure (mmHg)IndexWaveform Comment +---------+------------------+-----+---------+-------+ Brachial 135                                     +---------+------------------+-----+---------+-------+ PTA      158               1.15 triphasic        +---------+------------------+-----+---------+-------+ DP       159               1.16 biphasic         +---------+------------------+-----+---------+-------+ [place] Number               0.93 Normal           +---------+------------------+-----+---------+-------+  Summary: Right: Resting right ankle-brachial index indicates noncompressible right lower extremity arteries. The right toe-brachial index is abnormal.  Left: Resting left ankle-brachial index is within normal range. The left toe-brachial index is normal.  *See table(s) above for measurements and observations.  Electronically signed by Selinda Gu MD on 03/16/2024 at 7:25:18 AM.    Final        Assessment & Plan:   1. Atherosclerosis of native arteries of the extremities with ulceration (HCC) (Primary) Recommend:  The patient is status post successful angiogram with intervention.  The patient reports that the  claudication symptoms and leg pain has improved.   The patient denies lifestyle limiting changes at this point in time.  No further invasive studies, angiography or surgery at this time. The patient should continue walking and begin a more formal exercise program.  The patient should continue antiplatelet therapy and aggressive treatment of the lipid abnormalities  Continued surveillance is indicated as atherosclerosis is likely to progress with time.    Patient should undergo noninvasive studies as ordered. The patient will follow up with me to review the studies.   2. Essential hypertension Continue antihypertensive medications as already ordered, these medications have been reviewed and there are no changes at this time.  3. Type 2 diabetes mellitus with  hyperosmolarity without coma, unspecified whether long term insulin  use (HCC) Continue hypoglycemic medications as already ordered, these medications have been reviewed and there are no changes at this time.  Hgb A1C to be monitored as already arranged by primary service   Current Outpatient Medications on File Prior to Visit  Medication Sig Dispense Refill  . acetaminophen  (TYLENOL ) 325 MG tablet Take 2 tablets (650 mg total) by mouth every 6 (six) hours as needed for mild pain (or Fever >/= 101).    . ALPRAZolam  (XANAX ) 0.25 MG tablet Take 0.25 mg by mouth 2 (two) times daily. (Patient taking differently: Take 0.25 mg by mouth 2 (two) times daily as needed.)    . amLODipine  (NORVASC ) 10 MG tablet Take 10 mg by mouth daily as needed.    . ascorbic acid  (VITAMIN C ) 500 MG tablet Take 1 tablet (500 mg total) by mouth 2 (two) times daily. 30 tablet 0  . aspirin  EC 81 MG tablet Take 1 tablet (81 mg total) by mouth daily. Swallow whole. 150 tablet 1  . atorvastatin (LIPITOR) 20 MG tablet Take 20 mg by mouth daily.    . clopidogrel  (PLAVIX ) 75 MG tablet Take 1 tablet (75 mg total) by mouth daily. 30 tablet 5  . DULoxetine  (CYMBALTA ) 30 MG  capsule Take 30 mg by mouth daily.    . levETIRAcetam  (KEPPRA ) 500 MG tablet Take 1 tablet (500 mg total) by mouth 2 (two) times daily. 30 tablet 2  . lisinopril -hydrochlorothiazide  (ZESTORETIC ) 20-25 MG tablet Take 1 tablet by mouth daily.    . metFORMIN  (GLUCOPHAGE ) 1000 MG tablet Take 1,000 mg by mouth 2 (two) times daily with a meal.    . metoprolol  succinate (TOPROL -XL) 100 MG 24 hr tablet Take 100 mg by mouth daily. Take with or immediately following a meal.    . Multiple Vitamins-Minerals (MULTIVITAMIN WITH MINERALS) tablet Take 1 tablet by mouth daily.    . pantoprazole  (PROTONIX ) 40 MG tablet Take 1 tablet (40 mg total) by mouth daily. 30 tablet 11  . triamcinolone  cream (KENALOG ) 0.5 % Apply topically 2 (two) times daily. 30 g 0  . zinc  sulfate 220 (50 Zn) MG capsule Take 1 capsule (220 mg total) by mouth daily. 30 capsule 0  . chlorhexidine  (PERIDEX ) 0.12 % solution Use as directed 15 mLs in the mouth or throat 2 (two) times daily. (Patient not taking: Reported on 04/14/2024)    . JANUVIA 100 MG tablet Take 100 mg by mouth daily. (Patient not taking: Reported on 04/14/2024)     No current facility-administered medications on file prior to visit.    There are no Patient Instructions on file for this visit. No follow-ups on file.   Klohe Lovering E Daphyne Miguez, NP

## 2024-04-18 LAB — VAS US ABI WITH/WO TBI
Left ABI: >1
Right ABI: >1

## 2024-07-06 ENCOUNTER — Other Ambulatory Visit (INDEPENDENT_AMBULATORY_CARE_PROVIDER_SITE_OTHER): Payer: Self-pay | Admitting: Vascular Surgery

## 2024-07-06 DIAGNOSIS — Z9889 Other specified postprocedural states: Secondary | ICD-10-CM

## 2024-07-16 DIAGNOSIS — K219 Gastro-esophageal reflux disease without esophagitis: Secondary | ICD-10-CM | POA: Insufficient documentation

## 2024-07-16 NOTE — Progress Notes (Unsigned)
 "                                                                      MRN : [id number]  Daniel Ritter is a 49 y.o. ([birth year]) male who presents with chief complaint of check circulation.  History of Present Illness:   The patient returns to the office for followup and review status post angiogram with intervention on 03/22/2024.    Procedure: Procedure(s) Performed:             1.  Introduction catheter into right lower extremity 3rd order catheter placement               2.    Contrast injection right lower extremity for distal runoff             3.  Percutaneous transluminal angioplasty and stent placement right popliteal 4.25 mm             4.  Atherectomy right popliteal artery using the [place] catheter.               5.   Percutaneous transluminal angioplasty right posterior tibial to 2.5 mm             6.  Star close closure left common femoral arteriotomy     The patient notes improvement in the lower extremity symptoms. No interval shortening of the patient's claudication distance or rest pain symptoms. No new ulcers or wounds have occurred since the last visit.  He notes that the wound has been improving since his intervention.  He continues to follow with Dr. Neill   There have been no significant changes to the patient's overall health care.   No documented history of amaurosis fugax or recent TIA symptoms. There are no recent neurological changes noted. No documented history of DVT, PE or superficial thrombophlebitis. The patient denies recent episodes of angina or shortness of breath.    ABI's Rt=[place] and Lt=[place]  (previous ABI's Rt=[place] and Lt=1.16) he has normal TBI's bilaterally Duplex US  of the bilateral tibial vessels show strong triphasic waveforms with good toe waveforms bilaterally.    Active Medications[1]  Past Medical History:  Diagnosis Date   Allergy    seasonal   Anxiety    Diabetes mellitus without complication (HCC)    GERD (gastroesophageal  reflux disease)    H/O ETOH abuse    History of rectal abscess    Hypertension 2007   Neuropathy    Pancreatitis    history of, pt states due to alcohol   Seizures (HCC) 08/2014    two seizures in the hospital   Stroke [facility]) 08/31/2013   Thrombocytopenia     Past Surgical History:  Procedure Laterality Date   AMPUTATION TOE Right 01/30/2022   Procedure: PARTIAL 5TH RAY AMUPTATION RIGHT FOOT;  Surgeon: Ashley Soulier, DPM;  Location: [facility] ORS;  Service: Podiatry;  Laterality: Right;   ANTERIOR CRUCIATE LIGAMENT REPAIR Left 1992, 1993   COLONOSCOPY N/A 09/04/2021   Procedure: COLONOSCOPY;  Surgeon: Toledo, Ladell POUR, MD;  Location: [facility] ENDOSCOPY;  Service: Gastroenterology;  Laterality: N/A;  DM   INCISION AND DRAINAGE PERIRECTAL ABSCESS N/A 12/14/2015   Procedure: IRRIGATION AND DEBRIDEMENT PERIRECTAL ABSCESS;  Surgeon: Louanne MATSU  Dellie, MD;  Location: [facility] ORS;  Service: General;  Laterality: N/A;   IRRIGATION AND DEBRIDEMENT FOOT Bilateral 01/30/2022   Procedure: IRRIGATION AND DEBRIDEMENT TENDON ULCER LEFT FOOT,  EXCISION OF 2ND TOE JOINT LEFT FOOT, IRRIGATION AND DEBRIDEMENT SUBCUTANEOUS ULCER RIGHT FOOT;  Surgeon: Ashley Soulier, DPM;  Location: [facility] ORS;  Service: Podiatry;  Laterality: Bilateral;   IRRIGATION AND DEBRIDEMENT FOOT Right 05/01/2022   Procedure: IRRIGATION AND DEBRIDEMENT FOOT;SOFT TISSUE AND BONE;  Surgeon: Neill Boas, DPM;  Location: [facility] ORS;  Service: Podiatry;  Laterality: Right;   LOWER EXTREMITY ANGIOGRAPHY Right 02/04/2022   Procedure: Lower Extremity Angiography;  Surgeon: Jama Cordella MATSU, MD;  Location: [facility] INVAS[facility] CV LAB;  Service: Cardiovascular;  Laterality: Right;   LOWER EXTREMITY ANGIOGRAPHY Right 03/22/2024   Procedure: Lower Extremity Angiography;  Surgeon: Jama Cordella MATSU, MD;  Location: [facility] INVASIVE CV LAB;  Service: Cardiovascular;  Laterality: Right;    Social History Social History[2]  Family History Family History  Problem  Relation Age of Onset   Hypertension Mother    Anxiety disorder Mother    Hypertension Father    Hypertension Brother    Leukemia Maternal Grandfather    Lung cancer Paternal Grandfather     Allergies[3]   REVIEW OF SYSTEMS (Negative unless checked)  Constitutional: [] Weight loss  [] Fever  [] Chills Cardiac: [] Chest pain   [] Chest pressure   [] Palpitations   [] Shortness of breath when laying flat   [] Shortness of breath with exertion. Vascular:  [x] Pain in legs with walking   [] Pain in legs at rest  [] History of DVT   [] Phlebitis   [] Swelling in legs   [] Varicose veins   [] Non-healing ulcers Pulmonary:   [] Uses home oxygen   [] Productive cough   [] Hemoptysis   [] Wheeze  [] COPD   [] Asthma Neurologic:  [] Dizziness   [] Seizures   [] History of stroke   [] History of TIA  [] Aphasia   [] Vissual changes   [] Weakness or numbness in arm   [] Weakness or numbness in leg Musculoskeletal:   [] Joint swelling   [] Joint pain   [] Low back pain Hematologic:  [] Easy bruising  [] Easy bleeding   [] Hypercoagulable state   [] Anemic Gastrointestinal:  [] Diarrhea   [] Vomiting  [x] Gastroesophageal reflux/heartburn   [] Difficulty swallowing. Genitourinary:  [] Chronic kidney disease   [] Difficult urination  [] Frequent urination   [] Blood in urine Skin:  [] Rashes   [] Ulcers  Psychological:  [x] History of anxiety   []  History of major depression.  Physical Examination  There were no vitals filed for this visit. There is no height or weight on file to calculate BMI. Gen: WD/WN, NAD Head: [place]/AT, No temporalis wasting.  Ear/Nose/Throat: Hearing grossly intact, nares w/o erythema or drainage Eyes: PER, EOMI, sclera nonicteric.  Neck: Supple, no masses.  No bruit or JVD.  Pulmonary:  Good air movement, no audible wheezing, no use of accessory muscles.  Cardiac: RRR, normal S1, S2, no Murmurs. Vascular:  mild trophic changes, no open wounds Vessel Right Left  Radial Palpable Palpable  PT Not Palpable Not Palpable   DP Not Palpable Not Palpable  Gastrointestinal: soft, non-distended. No guarding/no peritoneal signs.  Musculoskeletal: M/S 5/5 throughout.  No visible deformity.  Neurologic: CN 2-12 intact. Pain and light touch intact in extremities.  Symmetrical.  Speech is fluent. Motor exam as listed above. Psychiatric: Judgment intact, Mood & affect appropriate for pt's clinical situation. Dermatologic: No rashes or ulcers noted.  No changes consistent with cellulitis.   CBC Lab Results  Component Value Date  WBC 9.7 02/03/2024   HGB 14.3 02/03/2024   HCT 40.5 02/03/2024   MCV 88.0 02/03/2024   PLT 154 02/03/2024    BMET    Component Value Date/Time   NA 135 02/03/2024 1404   NA 140 11/08/2014 0851   NA 136 09/20/2011 0727   K 4.4 02/03/2024 1404   K 4.0 10/10/2011 1407   CL 101 02/03/2024 1404   CL 103 09/20/2011 0727   CO2 24 02/03/2024 1404   CO2 21 09/20/2011 0727   GLUCOSE 184 (H) 02/03/2024 1404   GLUCOSE 153 (H) 09/20/2011 0727   BUN 9 03/22/2024 1241   BUN 13 11/08/2014 0851   BUN 11 09/20/2011 0727   CREATININE 0.79 03/22/2024 1241   CREATININE 0.75 10/10/2011 1407   CALCIUM 9.2 02/03/2024 1404   CALCIUM 9.2 09/20/2011 0727   GFRNONAA >60 03/22/2024 1241   GFRNONAA >60 10/10/2011 1407   GFRAA >60 12/12/2015 1540   GFRAA >60 10/10/2011 1407   CrCl cannot be calculated (Patient's most recent lab result is older than the maximum 21 days allowed.).  COAG Lab Results  Component Value Date   INR 1.0 05/01/2022   INR 1.1 01/29/2022   INR 1.1 01/28/2022    Radiology No results found.   Assessment/Plan There are no diagnoses linked to this encounter.   Cordella Shawl, MD  07/16/2024 1:18 PM      [1]  No outpatient medications have been marked as taking for the 07/18/24 encounter (Appointment) with Shawl, Cordella MATSU, MD.  [2]  Social History Tobacco Use   Smoking status: Former    Current packs/day: 0.00    Average packs/day: 0.3 packs/day for 18.0  years (4.5 ttl pk-yrs)    Types: Cigarettes    Start date: 04/11/2002    Quit date: 04/11/2020    Years since quitting: 4.2   Smokeless tobacco: Former    Types: Snuff    Quit date: 2001   Tobacco comments:    Patient hasn't smoked in years.  Vaping Use   Vaping status: Never Used  Substance Use Topics   Alcohol use: Not Currently    Comment: hasn't drank it about 2 years   Drug use: Not Currently    Types: Marijuana    Comment: gummies daily for pain  [3] No Known Allergies  "

## 2024-07-18 ENCOUNTER — Encounter (INDEPENDENT_AMBULATORY_CARE_PROVIDER_SITE_OTHER)

## 2024-07-18 ENCOUNTER — Ambulatory Visit (INDEPENDENT_AMBULATORY_CARE_PROVIDER_SITE_OTHER): Admitting: Vascular Surgery

## 2024-07-18 DIAGNOSIS — I10 Essential (primary) hypertension: Secondary | ICD-10-CM

## 2024-07-18 DIAGNOSIS — I739 Peripheral vascular disease, unspecified: Secondary | ICD-10-CM

## 2024-07-18 DIAGNOSIS — E1100 Type 2 diabetes mellitus with hyperosmolarity without nonketotic hyperglycemic-hyperosmolar coma (NKHHC): Secondary | ICD-10-CM

## 2024-07-18 DIAGNOSIS — K219 Gastro-esophageal reflux disease without esophagitis: Secondary | ICD-10-CM

## 2024-07-30 ENCOUNTER — Emergency Department

## 2024-07-30 ENCOUNTER — Inpatient Hospital Stay

## 2024-07-30 ENCOUNTER — Inpatient Hospital Stay
Admission: EM | Admit: 2024-07-30 | Discharge: 2024-08-04 | DRG: 623 | Disposition: A | Discharge status: Home / Self Care | Attending: Internal Medicine | Admitting: Internal Medicine

## 2024-07-30 DIAGNOSIS — F419 Anxiety disorder, unspecified: Secondary | ICD-10-CM | POA: Diagnosis present

## 2024-07-30 DIAGNOSIS — Z8673 Personal history of transient ischemic attack (TIA), and cerebral infarction without residual deficits: Secondary | ICD-10-CM

## 2024-07-30 DIAGNOSIS — Z8249 Family history of ischemic heart disease and other diseases of the circulatory system: Secondary | ICD-10-CM | POA: Diagnosis not present

## 2024-07-30 DIAGNOSIS — L97419 Non-pressure chronic ulcer of right heel and midfoot with unspecified severity: Secondary | ICD-10-CM | POA: Diagnosis present

## 2024-07-30 DIAGNOSIS — E1140 Type 2 diabetes mellitus with diabetic neuropathy, unspecified: Secondary | ICD-10-CM | POA: Diagnosis present

## 2024-07-30 DIAGNOSIS — I10 Essential (primary) hypertension: Secondary | ICD-10-CM | POA: Diagnosis present

## 2024-07-30 DIAGNOSIS — M86171 Other acute osteomyelitis, right ankle and foot: Secondary | ICD-10-CM | POA: Diagnosis present

## 2024-07-30 DIAGNOSIS — Z818 Family history of other mental and behavioral disorders: Secondary | ICD-10-CM

## 2024-07-30 DIAGNOSIS — Z8614 Personal history of Methicillin resistant Staphylococcus aureus infection: Secondary | ICD-10-CM | POA: Diagnosis not present

## 2024-07-30 DIAGNOSIS — M8610 Other acute osteomyelitis, unspecified site: Secondary | ICD-10-CM | POA: Diagnosis present

## 2024-07-30 DIAGNOSIS — K219 Gastro-esophageal reflux disease without esophagitis: Secondary | ICD-10-CM | POA: Diagnosis present

## 2024-07-30 DIAGNOSIS — Z9862 Peripheral vascular angioplasty status: Secondary | ICD-10-CM

## 2024-07-30 DIAGNOSIS — Z87891 Personal history of nicotine dependence: Secondary | ICD-10-CM | POA: Diagnosis not present

## 2024-07-30 DIAGNOSIS — E785 Hyperlipidemia, unspecified: Secondary | ICD-10-CM | POA: Diagnosis present

## 2024-07-30 DIAGNOSIS — Z7902 Long term (current) use of antithrombotics/antiplatelets: Secondary | ICD-10-CM

## 2024-07-30 DIAGNOSIS — Z89421 Acquired absence of other right toe(s): Secondary | ICD-10-CM

## 2024-07-30 DIAGNOSIS — G40909 Epilepsy, unspecified, not intractable, without status epilepticus: Secondary | ICD-10-CM | POA: Diagnosis present

## 2024-07-30 DIAGNOSIS — E1165 Type 2 diabetes mellitus with hyperglycemia: Secondary | ICD-10-CM | POA: Diagnosis not present

## 2024-07-30 DIAGNOSIS — Z79899 Other long term (current) drug therapy: Secondary | ICD-10-CM

## 2024-07-30 DIAGNOSIS — Z7984 Long term (current) use of oral hypoglycemic drugs: Secondary | ICD-10-CM | POA: Diagnosis not present

## 2024-07-30 DIAGNOSIS — F32A Depression, unspecified: Secondary | ICD-10-CM | POA: Diagnosis present

## 2024-07-30 DIAGNOSIS — I739 Peripheral vascular disease, unspecified: Secondary | ICD-10-CM | POA: Diagnosis not present

## 2024-07-30 DIAGNOSIS — E1151 Type 2 diabetes mellitus with diabetic peripheral angiopathy without gangrene: Secondary | ICD-10-CM | POA: Diagnosis present

## 2024-07-30 DIAGNOSIS — E11621 Type 2 diabetes mellitus with foot ulcer: Secondary | ICD-10-CM | POA: Diagnosis present

## 2024-07-30 DIAGNOSIS — Z7982 Long term (current) use of aspirin: Secondary | ICD-10-CM

## 2024-07-30 DIAGNOSIS — L03115 Cellulitis of right lower limb: Secondary | ICD-10-CM | POA: Diagnosis present

## 2024-07-30 DIAGNOSIS — E1100 Type 2 diabetes mellitus with hyperosmolarity without nonketotic hyperglycemic-hyperosmolar coma (NKHHC): Secondary | ICD-10-CM

## 2024-07-30 DIAGNOSIS — E1110 Type 2 diabetes mellitus with ketoacidosis without coma: Secondary | ICD-10-CM | POA: Diagnosis present

## 2024-07-30 DIAGNOSIS — Z794 Long term (current) use of insulin: Secondary | ICD-10-CM

## 2024-07-30 DIAGNOSIS — E11628 Type 2 diabetes mellitus with other skin complications: Principal | ICD-10-CM | POA: Diagnosis present

## 2024-07-30 DIAGNOSIS — L089 Local infection of the skin and subcutaneous tissue, unspecified: Secondary | ICD-10-CM | POA: Diagnosis not present

## 2024-07-30 DIAGNOSIS — L97519 Non-pressure chronic ulcer of other part of right foot with unspecified severity: Secondary | ICD-10-CM | POA: Diagnosis not present

## 2024-07-30 DIAGNOSIS — R569 Unspecified convulsions: Secondary | ICD-10-CM

## 2024-07-30 LAB — BASIC METABOLIC PANEL WITH GFR
Anion gap: 19 — ABNORMAL HIGH (ref 5–15)
Anion gap: 10 (ref 5–15)
Anion gap: 11 (ref 5–15)
Anion gap: 11 (ref 5–15)
BUN: 15 mg/dL (ref 6–20)
BUN: 13 mg/dL (ref 6–20)
BUN: 11 mg/dL (ref 6–20)
BUN: 11 mg/dL (ref 6–20)
CO2: 18 mmol/L — ABNORMAL LOW (ref 22–32)
CO2: 24 mmol/L (ref 22–32)
CO2: 25 mmol/L (ref 22–32)
CO2: 25 mmol/L (ref 22–32)
Calcium: 9.1 mg/dL (ref 8.9–10.3)
Calcium: 8.7 mg/dL — ABNORMAL LOW (ref 8.9–10.3)
Calcium: 9.2 mg/dL (ref 8.9–10.3)
Calcium: 9.2 mg/dL (ref 8.9–10.3)
Chloride: 92 mmol/L — ABNORMAL LOW (ref 98–111)
Chloride: 101 mmol/L (ref 98–111)
Chloride: 102 mmol/L (ref 98–111)
Chloride: 101 mmol/L (ref 98–111)
Creatinine, Ser: 0.92 mg/dL (ref 0.61–1.24)
Creatinine, Ser: 0.73 mg/dL (ref 0.61–1.24)
Creatinine, Ser: 0.61 mg/dL (ref 0.61–1.24)
Creatinine, Ser: 0.66 mg/dL (ref 0.61–1.24)
GFR, Estimated: >60 mL/min
GFR, Estimated: >60 mL/min
GFR, Estimated: >60 mL/min
GFR, Estimated: >60 mL/min
Glucose, Bld: 592 mg/dL (ref 70–99)
Glucose, Bld: 421 mg/dL — ABNORMAL HIGH (ref 70–99)
Glucose, Bld: 178 mg/dL — ABNORMAL HIGH (ref 70–99)
Glucose, Bld: 154 mg/dL — ABNORMAL HIGH (ref 70–99)
Potassium: 4.6 mmol/L (ref 3.5–5.1)
Potassium: 3.7 mmol/L (ref 3.5–5.1)
Potassium: 3.5 mmol/L (ref 3.5–5.1)
Potassium: 3.5 mmol/L (ref 3.5–5.1)
Sodium: 129 mmol/L — ABNORMAL LOW (ref 135–145)
Sodium: 136 mmol/L (ref 135–145)
Sodium: 138 mmol/L (ref 135–145)
Sodium: 137 mmol/L (ref 135–145)

## 2024-07-30 LAB — URINALYSIS, ROUTINE W REFLEX MICROSCOPIC
Bacteria, UA: NONE SEEN
Bilirubin Urine: NEGATIVE
Glucose, UA: >=500 mg/dL — AB
Ketones, ur: 5 mg/dL — AB
Nitrite: NEGATIVE
Protein, ur: NEGATIVE mg/dL
Specific Gravity, Urine: 1.027 (ref 1.005–1.030)
pH: 6 (ref 5.0–8.0)

## 2024-07-30 LAB — CBC
HCT: 36 % — ABNORMAL LOW (ref 39.0–52.0)
Hemoglobin: 12.6 g/dL — ABNORMAL LOW (ref 13.0–17.0)
MCH: 29.6 pg (ref 26.0–34.0)
MCHC: 35 g/dL (ref 30.0–36.0)
MCV: 84.5 fL (ref 80.0–100.0)
Platelets: 187 10*3/uL (ref 150–400)
RBC: 4.26 MIL/uL (ref 4.22–5.81)
RDW: 12.5 % (ref 11.5–15.5)
WBC: 9 10*3/uL (ref 4.0–10.5)
nRBC: 0 % (ref 0.0–0.2)

## 2024-07-30 LAB — BETA-HYDROXYBUTYRIC ACID
Beta-Hydroxybutyric Acid: 1.12 mmol/L — ABNORMAL HIGH (ref 0.05–0.27)
Beta-Hydroxybutyric Acid: 0.78 mmol/L — ABNORMAL HIGH (ref 0.05–0.27)
Beta-Hydroxybutyric Acid: 0.34 mmol/L — ABNORMAL HIGH (ref 0.05–0.27)
Beta-Hydroxybutyric Acid: 0.27 mmol/L (ref 0.05–0.27)

## 2024-07-30 LAB — BLOOD GAS, VENOUS
Acid-Base Excess: 1 mmol/L (ref 0.0–2.0)
Bicarbonate: 26.6 mmol/L (ref 20.0–28.0)
O2 Saturation: 53.8 %
Patient temperature: 37
pCO2, Ven: 45 mmHg (ref 44–60)
pH, Ven: 7.38 (ref 7.25–7.43)

## 2024-07-30 LAB — GLUCOSE, CAPILLARY
Glucose-Capillary: 154 mg/dL — ABNORMAL HIGH (ref 70–99)
Glucose-Capillary: 156 mg/dL — ABNORMAL HIGH (ref 70–99)
Glucose-Capillary: 158 mg/dL — ABNORMAL HIGH (ref 70–99)
Glucose-Capillary: 161 mg/dL — ABNORMAL HIGH (ref 70–99)

## 2024-07-30 LAB — CBG MONITORING, ED
Glucose-Capillary: 414 mg/dL — ABNORMAL HIGH (ref 70–99)
Glucose-Capillary: 384 mg/dL — ABNORMAL HIGH (ref 70–99)
Glucose-Capillary: 305 mg/dL — ABNORMAL HIGH (ref 70–99)
Glucose-Capillary: 219 mg/dL — ABNORMAL HIGH (ref 70–99)

## 2024-07-30 MED ORDER — LACTATED RINGERS IV SOLN: INTRAVENOUS | Status: DC

## 2024-07-30 MED ORDER — METOPROLOL SUCCINATE ER 50 MG PO TB24
100.0000 mg | ORAL_TABLET | Freq: Every day | ORAL | Status: DC
  Administered 2024-07-31 – 2024-08-04 (×5): 100 mg via ORAL
  Filled 2024-07-30 (×3): qty 1
  Filled 2024-07-30: qty 2
  Filled 2024-07-30: qty 1

## 2024-07-30 MED ORDER — HYDROCHLOROTHIAZIDE 25 MG PO TABS
25.0000 mg | ORAL_TABLET | Freq: Every day | ORAL | Status: DC
  Administered 2024-07-31 – 2024-08-04 (×5): 25 mg via ORAL
  Filled 2024-07-30 (×5): qty 1

## 2024-07-30 MED ORDER — VANCOMYCIN HCL IN DEXTROSE 1-5 GM/200ML-% IV SOLN
1000.0000 mg | Freq: Once | INTRAVENOUS | Status: AC
  Administered 2024-07-30: 1000 mg via INTRAVENOUS
  Filled 2024-07-30: qty 200

## 2024-07-30 MED ORDER — INSULIN REGULAR(HUMAN) IN NACL 100-0.9 UT/100ML-% IV SOLN
INTRAVENOUS | Status: AC
  Administered 2024-07-30: 19 [IU]/h via INTRAVENOUS
  Filled 2024-07-30: qty 100

## 2024-07-30 MED ORDER — DEXTROSE IN LACTATED RINGERS 5 % IV SOLN: INTRAVENOUS | Status: AC

## 2024-07-30 MED ORDER — TRAZODONE HCL 50 MG PO TABS
25.0000 mg | ORAL_TABLET | Freq: Every evening | ORAL | Status: DC | PRN
  Filled 2024-07-30: qty 1

## 2024-07-30 MED ORDER — VANCOMYCIN HCL 1500 MG/300ML IV SOLN
1500.0000 mg | Freq: Once | INTRAVENOUS | Status: AC
  Administered 2024-07-30: 1500 mg via INTRAVENOUS
  Filled 2024-07-30 (×2): qty 300

## 2024-07-30 MED ORDER — INSULIN GLARGINE-YFGN 100 UNIT/ML ~~LOC~~ SOLN
22.0000 [IU] | Freq: Once | SUBCUTANEOUS | Status: AC
  Administered 2024-07-31: 22 [IU] via SUBCUTANEOUS
  Filled 2024-07-30: qty 0.22

## 2024-07-30 MED ORDER — POTASSIUM CHLORIDE CRYS ER 20 MEQ PO TBCR
40.0000 meq | EXTENDED_RELEASE_TABLET | Freq: Once | ORAL | Status: AC
  Administered 2024-07-30: 40 meq via ORAL
  Filled 2024-07-30: qty 2

## 2024-07-30 MED ORDER — LEVETIRACETAM 500 MG PO TABS
500.0000 mg | ORAL_TABLET | Freq: Two times a day (BID) | ORAL | Status: DC
  Administered 2024-07-30 – 2024-08-04 (×10): 500 mg via ORAL
  Filled 2024-07-30 (×10): qty 1

## 2024-07-30 MED ORDER — SODIUM CHLORIDE 0.9 % IV BOLUS
1000.0000 mL | Freq: Once | INTRAVENOUS | Status: AC
  Administered 2024-07-30: 1000 mL via INTRAVENOUS

## 2024-07-30 MED ORDER — GADOBUTROL 1 MMOL/ML IV SOLN
10.0000 mL | Freq: Once | INTRAVENOUS | Status: AC | PRN
  Administered 2024-07-30: 10 mL via INTRAVENOUS

## 2024-07-30 MED ORDER — LISINOPRIL 5 MG PO TABS
20.0000 mg | ORAL_TABLET | Freq: Every day | ORAL | Status: DC
  Administered 2024-07-31 – 2024-08-04 (×5): 20 mg via ORAL
  Filled 2024-07-30 (×3): qty 1
  Filled 2024-07-30: qty 4
  Filled 2024-07-30: qty 1

## 2024-07-30 MED ORDER — ONDANSETRON HCL 4 MG PO TABS: 4.0000 mg | ORAL_TABLET | Freq: Four times a day (QID) | ORAL | Status: DC | PRN

## 2024-07-30 MED ORDER — POTASSIUM CHLORIDE 10 MEQ/100ML IV SOLN
10.0000 meq | INTRAVENOUS | Status: AC
  Administered 2024-07-30 (×2): 10 meq via INTRAVENOUS
  Filled 2024-07-30 (×2): qty 100

## 2024-07-30 MED ORDER — ENOXAPARIN SODIUM 60 MG/0.6ML IJ SOSY
55.0000 mg | PREFILLED_SYRINGE | INTRAMUSCULAR | Status: DC
  Administered 2024-07-30 – 2024-08-03 (×5): 55 mg via SUBCUTANEOUS
  Filled 2024-07-30 (×5): qty 0.6

## 2024-07-30 MED ORDER — SODIUM CHLORIDE 0.9 % IV SOLN
1.0000 g | Freq: Once | INTRAVENOUS | Status: AC
  Administered 2024-07-30: 1 g via INTRAVENOUS
  Filled 2024-07-30: qty 10

## 2024-07-30 MED ORDER — LACTATED RINGERS IV BOLUS
20.0000 mL/kg | Freq: Once | INTRAVENOUS | Status: AC
  Administered 2024-07-30: 2280 mL via INTRAVENOUS

## 2024-07-30 MED ORDER — HYDROMORPHONE HCL 1 MG/ML IJ SOLN
0.5000 mg | INTRAMUSCULAR | Status: DC | PRN
  Administered 2024-07-31 – 2024-08-04 (×16): 1 mg via INTRAVENOUS
  Filled 2024-07-30 (×16): qty 1

## 2024-07-30 MED ORDER — LISINOPRIL-HYDROCHLOROTHIAZIDE 20-25 MG PO TABS: 1.0000 | ORAL_TABLET | Freq: Every day | ORAL | Status: DC

## 2024-07-30 MED ORDER — LACTATED RINGERS IV BOLUS: 20.0000 mL/kg | Freq: Once | INTRAVENOUS | Status: DC

## 2024-07-30 MED ORDER — DEXTROSE 50 % IV SOLN: 0.0000 mL | INTRAVENOUS | Status: DC | PRN

## 2024-07-30 MED ORDER — INSULIN ASPART 100 UNIT/ML IJ SOLN
0.0000 [IU] | Freq: Three times a day (TID) | INTRAMUSCULAR | Status: DC
  Administered 2024-07-31 (×2): 11 [IU] via SUBCUTANEOUS
  Administered 2024-07-31: 7 [IU] via SUBCUTANEOUS
  Administered 2024-08-01: 11 [IU] via SUBCUTANEOUS
  Administered 2024-08-01 – 2024-08-02 (×3): 7 [IU] via SUBCUTANEOUS
  Administered 2024-08-02: 15 [IU] via SUBCUTANEOUS
  Administered 2024-08-02: 4 [IU] via SUBCUTANEOUS
  Administered 2024-08-03: 7 [IU] via SUBCUTANEOUS
  Administered 2024-08-03: 11 [IU] via SUBCUTANEOUS
  Administered 2024-08-03: 7 [IU] via SUBCUTANEOUS
  Administered 2024-08-04: 4 [IU] via SUBCUTANEOUS
  Administered 2024-08-04: 7 [IU] via SUBCUTANEOUS
  Filled 2024-07-30 (×2): qty 7
  Filled 2024-07-30: qty 4
  Filled 2024-07-30: qty 7
  Filled 2024-07-30 (×2): qty 11
  Filled 2024-07-30: qty 7
  Filled 2024-07-30: qty 11
  Filled 2024-07-30 (×2): qty 7
  Filled 2024-07-30: qty 11
  Filled 2024-07-30: qty 7
  Filled 2024-07-30: qty 11

## 2024-07-30 MED ORDER — CHLORHEXIDINE GLUCONATE CLOTH 2 % EX PADS
6.0000 | MEDICATED_PAD | Freq: Every day | CUTANEOUS | Status: DC
  Administered 2024-07-30 – 2024-08-02 (×4): 6 via TOPICAL

## 2024-07-30 MED ORDER — INSULIN ASPART 100 UNIT/ML IJ SOLN
0.0000 [IU] | Freq: Every day | INTRAMUSCULAR | Status: DC
  Administered 2024-07-31: 4 [IU] via SUBCUTANEOUS
  Administered 2024-08-01 – 2024-08-02 (×2): 2 [IU] via SUBCUTANEOUS
  Filled 2024-07-30 (×2): qty 3
  Filled 2024-07-30: qty 2

## 2024-07-30 MED ORDER — ASPIRIN 81 MG PO TBEC: 81.0000 mg | DELAYED_RELEASE_TABLET | Freq: Every day | ORAL | Status: DC

## 2024-07-30 MED ORDER — ONDANSETRON HCL 4 MG/2ML IJ SOLN
4.0000 mg | Freq: Four times a day (QID) | INTRAMUSCULAR | Status: DC | PRN
  Administered 2024-08-02: 4 mg via INTRAVENOUS

## 2024-07-30 MED ORDER — SENNOSIDES-DOCUSATE SODIUM 8.6-50 MG PO TABS: 1.0000 | ORAL_TABLET | Freq: Every evening | ORAL | Status: DC | PRN

## 2024-07-30 MED ORDER — PIPERACILLIN-TAZOBACTAM 3.375 G IVPB
3.3750 g | Freq: Three times a day (TID) | INTRAVENOUS | Status: DC
  Administered 2024-07-30 – 2024-08-04 (×15): 3.375 g via INTRAVENOUS
  Filled 2024-07-30 (×15): qty 50

## 2024-07-30 MED ORDER — PIPERACILLIN-TAZOBACTAM 3.375 G IVPB: 3.3750 g | Freq: Three times a day (TID) | INTRAVENOUS | Status: DC

## 2024-07-30 MED ORDER — PANTOPRAZOLE SODIUM 40 MG PO TBEC: 40.0000 mg | DELAYED_RELEASE_TABLET | Freq: Every day | ORAL | Status: DC

## 2024-07-30 MED ORDER — ALPRAZOLAM 0.25 MG PO TABS
0.2500 mg | ORAL_TABLET | Freq: Two times a day (BID) | ORAL | Status: DC | PRN
  Administered 2024-08-02: 0.25 mg via ORAL
  Filled 2024-07-30: qty 1

## 2024-07-30 MED ORDER — ATORVASTATIN CALCIUM 20 MG PO TABS: 20.0000 mg | ORAL_TABLET | Freq: Every day | ORAL | Status: DC

## 2024-07-30 MED ORDER — DULOXETINE HCL 30 MG PO CPEP: 30.0000 mg | ORAL_CAPSULE | Freq: Every day | ORAL | Status: DC

## 2024-07-30 NOTE — ED Notes (Addendum)
 Right foot appears red and edematous. 1+ edema noted, pt denies pain. Wound on heel and inner front foot, dressings covering at this time. Pt reports increased swelling due to working and standing/walking a lot recently. Pt is diabetic, states blood sugars have been in the 120-150 range.

## 2024-07-30 NOTE — H&P (Signed)
 " History and Physical    Daniel Ritter FMW:[id number] DOB: 03-01-1976 DOA: 07/30/2024  PCP: Ernie Yancy Roof, MD (Confirm with patient/family/NH records and if not entered, this has to be entered at [facility] point of entry) Patient coming from: Home  I have personally briefly reviewed patient's old medical records in [facility]  Chief Complaint: Worsening of right foot infection  HPI: Daniel Ritter is a 49 y.o. male with medical history significant of IIDM, chronic right foot diabetic ulcer, PVD status post angioplasty and right popliteal stenting in September 2025, HTN, HLD, peripheral neuropathy, presented with worsening of right foot infection.  Patient reported that he has 2 ulcers on the right foot, 1 on the right heel developed 6 months ago, the second 1 is on the bottom of first metatarsal developed 2 months ago, and he has been following with Dr. Fernande for the 2 ulcers and was recently debrided in the office 2 weeks ago.  He was told both ulcers appear to be noninfected.  2 days ago he woke up and noticed swelling of his right foot and since yesterday he developed pain on the right forefoot but denied any fever no nauseous vomiting.  ED Course: Afebrile, nontachycardic no hypotension, blood work showed WBC 9.0 hemoglobin 12.6 BUN 15 creatinine 0.9 glucose 592 bicarb 18, beta hydroxy 1.1, K4.6.  Patient was given ceftriaxone  and vancomycin  in the ED and started on insulin  drip.  Review of Systems: As per HPI otherwise 14 point review of systems negative.    Past Medical History:  Diagnosis Date   Allergy    seasonal   Anxiety    Diabetes mellitus without complication (HCC)    GERD (gastroesophageal reflux disease)    H/O ETOH abuse    History of rectal abscess    Hypertension 2007   Neuropathy    Pancreatitis    history of, pt states due to alcohol   Seizures (HCC) 08/2014    two seizures in the hospital   Stroke [facility]) 08/31/2013   Thrombocytopenia      Past Surgical History:  Procedure Laterality Date   AMPUTATION TOE Right 01/30/2022   Procedure: PARTIAL 5TH RAY AMUPTATION RIGHT FOOT;  Surgeon: Ashley Soulier, DPM;  Location: [facility] OR[facility];  Service: Podiatry;  Laterality: Right;   ANTERIOR CRUCIATE LIGAMENT REPAIR Left 1992, 1993   COLONOSCOPY N/A 09/04/2021   Procedure: COLONOSCOPY;  Surgeon: Toledo, Ladell POUR, MD;  Location: [facility];  Service: Gastroenterology;  Laterality: N/A;  DM   INCISION AND DRAINAGE PERIRECTAL ABSCESS N/A 12/14/2015   Procedure: IRRIGATION AND DEBRIDEMENT PERIRECTAL ABSCESS;  Surgeon: Louanne KANDICE Muse, MD;  Location: [facility] ORS;  Service: General;  Laterality: N/A;   IRRIGATION AND DEBRIDEMENT FOOT Bilateral 01/30/2022   Procedure: IRRIGATION AND DEBRIDEMENT TENDON ULCER LEFT FOOT,  EXCISION OF 2ND TOE JOINT LEFT FOOT, IRRIGATION AND DEBRIDEMENT SUBCUTANEOUS ULCER RIGHT FOOT;  Surgeon: Ashley Soulier, DPM;  Location: [facility] OR[facility];  Service: Podiatry;  Laterality: Bilateral;   IRRIGATION AND DEBRIDEMENT FOOT Right 05/01/2022   Procedure: IRRIGATION AND DEBRIDEMENT FOOT;SOFT TISSUE AND BONE;  Surgeon: Neill Krystal, DPM;  Location: [facility] OR[facility];  Service: Podiatry;  Laterality: Right;   LOWER EXTREMITY ANGIOGRAPHY Right 02/04/2022   Procedure: Lower Extremity Angiography;  Surgeon: Jama Cordella KANDICE, MD;  Location: [facility];  Service: Cardiovascular;  Laterality: Right;   LOWER EXTREMITY ANGIOGRAPHY Right 03/22/2024   Procedure: Lower Extremity Angiography;  Surgeon: Jama Cordella KANDICE, MD;  Location: [facility];  Service: Cardiovascular;  Laterality: Right;     reports that he quit smoking about 4 years ago. His smoking use included cigarettes. He started smoking about 22 years ago. He has a 4.5 pack-year smoking history. He quit smokeless tobacco use about 25 years ago.  His smokeless tobacco use included snuff. He reports that he does not currently use alcohol. He reports that he does not currently use  drugs after having used the following drugs: Marijuana.  Allergies[1]  Family History  Problem Relation Age of Onset   Hypertension Mother    Anxiety disorder Mother    Hypertension Father    Hypertension Brother    Leukemia Maternal Grandfather    Lung cancer Paternal Grandfather      Prior to Admission medications  Medication Sig Start Date End Date Taking? Authorizing Provider  acetaminophen  (TYLENOL ) 325 MG tablet Take 2 tablets (650 mg total) by mouth every 6 (six) hours as needed for mild pain (or Fever >/= 101). 02/05/22   Jens Durand, MD  ALPRAZolam  (XANAX ) 0.25 MG tablet Take 0.25 mg by mouth 2 (two) times daily. Patient taking differently: Take 0.25 mg by mouth 2 (two) times daily as needed.    [provider]  amLODipine  (NORVASC ) 10 MG tablet Take 10 mg by mouth daily as needed. 09/02/21   [provider]  ascorbic acid  (VITAMIN C ) 500 MG tablet Take 1 tablet (500 mg total) by mouth 2 (two) times daily. 01/15/23   Patel, Sona, MD  aspirin  EC 81 MG tablet Take 1 tablet (81 mg total) by mouth daily. Swallow whole. 03/23/24 03/23/25  Schnier, Cordella MATSU, MD  atorvastatin  (LIPITOR) 20 MG tablet Take 20 mg by mouth daily. 03/04/24   [provider]  chlorhexidine  (PERIDEX ) 0.12 % solution Use as directed 15 mLs in the mouth or throat 2 (two) times daily. Patient not taking: Reported on 04/14/2024 10/22/22   [provider]  clopidogrel  (PLAVIX ) 75 MG tablet Take 1 tablet (75 mg total) by mouth daily. 03/23/24   Schnier, Cordella MATSU, MD  DULoxetine  (CYMBALTA ) 30 MG capsule Take 30 mg by mouth daily. 11/08/21   [provider]  JANUVIA 100 MG tablet Take 100 mg by mouth daily. Patient not taking: Reported on 04/14/2024 04/30/20   [provider]  levETIRAcetam  (KEPPRA ) 500 MG tablet Take 1 tablet (500 mg total) by mouth 2 (two) times daily. 09/04/14   Noemi Reena CROME, NP  lisinopril -hydrochlorothiazide  (ZESTORETIC ) 20-25 MG tablet Take 1  tablet by mouth daily. 10/31/22   [provider]  metFORMIN  (GLUCOPHAGE ) 1000 MG tablet Take 1,000 mg by mouth 2 (two) times daily with a meal.    [provider]  metoprolol  succinate (TOPROL -XL) 100 MG 24 hr tablet Take 100 mg by mouth daily. Take with or immediately following a meal.    [provider]  Multiple Vitamins-Minerals (MULTIVITAMIN WITH MINERALS) tablet Take 1 tablet by mouth daily.    [provider]  pantoprazole  (PROTONIX ) 40 MG tablet Take 1 tablet (40 mg total) by mouth daily. 03/22/24 03/22/25  Schnier, Cordella MATSU, MD  triamcinolone  cream (KENALOG ) 0.5 % Apply topically 2 (two) times daily. 01/15/23   Patel, Sona, MD  zinc  sulfate 220 (50 Zn) MG capsule Take 1 capsule (220 mg total) by mouth daily. 01/16/23   Tobie Calix, MD    Physical Exam: Vitals:   07/30/24 1130 07/30/24 1200 07/30/24 1230 07/30/24 1300  BP: (!) 141/84 (!) 141/86 (!) 140/84 136/87  Pulse: 98 (!)  101 92 92  Resp: 19 16 17 17   Temp:      SpO2: 99% 98% 96% 94%    Constitutional: NAD, calm, comfortable Vitals:   07/30/24 1130 07/30/24 1200 07/30/24 1230 07/30/24 1300  BP: (!) 141/84 (!) 141/86 (!) 140/84 136/87  Pulse: 98 (!) 101 92 92  Resp: 19 16 17 17   Temp:      SpO2: 99% 98% 96% 94%   Eyes: PERRL, lids and conjunctivae normal ENMT: Mucous membranes are moist. Posterior pharynx clear of any exudate or lesions.Normal dentition.  Neck: normal, supple, no masses, no thyromegaly Respiratory: clear to auscultation bilaterally, no wheezing, no crackles. Normal respiratory effort. No accessory muscle use.  Cardiovascular: Regular rate and rhythm, no murmurs / rubs / gallops. No extremity edema. 2+ pedal pulses. No carotid bruits.  Abdomen: no tenderness, no masses palpated. No hepatosplenomegaly. Bowel sounds positive.  Musculoskeletal: no clubbing / cyanosis. No joint deformity upper and lower extremities. Good ROM, no contractures. Normal muscle tone.  Skin: 2  ulcers on the bottom of the foot, a chronic stable looking ulcer on the heel, measuring about 3 x 3 cm, the 1 located on the bottom side of fourth metatarsal appears to be irregular based deep to the bone with some purulent discharge on top, macular rash and swelling around the ulcer Neurologic: CN 2-12 grossly intact. Sensation intact, DTR normal. Strength 5/5 in all 4.  Psychiatric: Normal judgment and insight. Alert and oriented x 3. Normal mood.       Labs on Admission: I have personally reviewed following labs and imaging studies  CBC: Recent Labs  Lab 07/30/24 1129  WBC 9.0  HGB 12.6*  HCT 36.0*  MCV 84.5  PLT 187   Basic Metabolic Panel: Recent Labs  Lab 07/30/24 1129  NA 129*  K 4.6  CL 92*  CO2 18*  GLUCOSE 592*  BUN 15  CREATININE 0.92  CALCIUM  9.1   GFR: CrCl cannot be calculated (Unknown ideal weight.). Liver Function Tests: No results for input(s): AST, ALT, ALKPHOS, BILITOT, PROT, ALBUMIN in the last 168 hours. No results for input(s): LIPASE, AMYLASE in the last 168 hours. No results for input(s): AMMONIA in the last 168 hours. Coagulation Profile: No results for input(s): INR, PROTIME in the last 168 hours. Cardiac Enzymes: No results for input(s): CKTOTAL, CKMB, CKMBINDEX, TROPONINI in the last 168 hours. BNP (last 3 results) No results for input(s): PROBNP in the last 8760 hours. HbA1C: No results for input(s): HGBA1C in the last 72 hours. CBG: No results for input(s): GLUCAP in the last 168 hours. Lipid Profile: No results for input(s): CHOL, HDL, LDLCALC, TRIG, CHOLHDL, LDLDIRECT in the last 72 hours. Thyroid Function Tests: No results for input(s): TSH, T4TOTAL, FREET4, T3FREE, THYROIDAB in the last 72 hours. Anemia Panel: No results for input(s): VITAMINB12, FOLATE, FERRITIN, TIBC, IRON, RETICCTPCT in the last 72 hours. Urine analysis:    Component Value Date/Time    COLORURINE YELLOW (A) 02/03/2024 1404   APPEARANCEUR CLEAR (A) 02/03/2024 1404   APPEARANCEUR Clear 09/17/2011 1058   LABSPEC 1.018 02/03/2024 1404   LABSPEC 1.033 09/17/2011 1058   PHURINE 5.0 02/03/2024 1404   GLUCOSEU 50 (A) 02/03/2024 1404   GLUCOSEU >=500 09/17/2011 1058   HGBUR NEGATIVE 02/03/2024 1404   BILIRUBINUR NEGATIVE 02/03/2024 1404   BILIRUBINUR Negative 09/17/2011 1058   KETONESUR 5 (A) 02/03/2024 1404   PROTEINUR 30 (A) 02/03/2024 1404   NITRITE NEGATIVE 02/03/2024 1404   LEUKOCYTESUR NEGATIVE 02/03/2024 1404  LEUKOCYTESUR Negative 09/17/2011 1058    Radiological Exams on Admission: DG Foot Complete Right Result Date: 07/30/2024 EXAM: 3 OR MORE VIEW(S) XRAY OF THE RIGHT FOOT 07/30/2024 12:45:07 PM COMPARISON: 02/03/2024 CLINICAL HISTORY: Infection, pain, swelling. 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 Navicular fracture of uncertain chronicity. Osteomyelitis is not excluded. Mild degenerative changes at the 1st metatarsophalangeal joint. No malalignment. SOFT TISSUES: Soft tissue ulceration along the plantar surface of the forefoot and hindfoot. Diffuse forefoot and soft tissue swelling. Soft tissue swelling about the plantar aspect of the hind foot. Diffuse vascular calcifications. IMPRESSION: 1. Navicular fracture of uncertain chronicity. Osteomyelitis is difficult to exclude. 2. Soft tissue ulceration along the plantar surface of the forefoot and hindfoot with diffuse soft tissue swelling. Electronically signed by: norman gatlin MD 07/30/2024 01:19 PM EST RP Workstation: [id number]    EKG: None  Assessment/Plan Principal Problem:   DKA (diabetic ketoacidosis) (HCC) Active Problems:   Acute osteomyelitis (HCC)   Cellulitis of right lower extremity   DKA, type 2 (HCC)  (please populate well all problems here in Problem List. (For example, if patient is on BP meds at home and you resume or decide to hold them, it is a problem that needs  to be her. Same for CAD, COPD, HLD and so on)  Right foot diabetic ulcer infection - The ulcer located on the bottom side of the forefoot is more concerning for deep infection and osteomyelitis, MRI is ordered by ED physician - Agree with broad-spectrum antibiotics vancomycin  and Zosyn  - Message sent to Dr. Ashley to podiatry - DVT study - Review of patient record showed patient had right popliteal stenting and angioplasty in September 2025. - Increasing risk of losing at least right-sided first big toe and part of first metatarsal, discussed with patient.  Type II DKA IIDM with hyperglycemia - Probably secondary to foot ulcer infection - Insulin  drip  HTN - Continue hydrochlorothiazide  and lisinopril   HLD - Continue statin  PVD status post recent angioplasty and right popliteal stenting - Continue aspirin , hold off Plavix  for the incoming podiatry intervention  DVT prophylaxis: Lovenox  Code Status: Full code Family Communication: None at bedside Disposition Plan: Patient is sick with worsening of right foot diabetic ulcer infection and possible osteomyelitis requiring IV antibiotics and inpatient podiatry consultation, expect more than 2 midnight hospital stay. Consults called: Podiatry Admission status: Stepdown unit admission for insulin  drip   Cort ONEIDA Mana MD [facility] Pager [phone]  07/30/2024, 2:39 PM        [1] No Known Allergies  "

## 2024-07-30 NOTE — ED Notes (Signed)
 First set of cultures sent down by this RN

## 2024-07-30 NOTE — ED Notes (Signed)
 Patient transported to MRI

## 2024-07-30 NOTE — ED Triage Notes (Signed)
 Pt to ED for right foot infection. States has been admitted for cellulitis several times for same. 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

## 2024-07-30 NOTE — ED Provider Notes (Signed)
 "  [facility] Provider Note    Event Date/Time   First MD Initiated Contact with Patient 07/30/24 1120     (approximate)   History   Wound Check   HPI  Daniel Ritter is a 49 y.o. male with a past medical history of hypertension, diabetes, seizures, presenting to the emergency department for concerns for right foot infection.  Patient reports that he has noticed increased swelling and redness to the foot.  He reports that he is needed to be admitted multiple times for the same.  Reports that he has had MRSA in the foot before.  Denies any fevers, sweats, chills, nausea, vomiting, or diarrhea.     Physical Exam   Triage Vital Signs: ED Triage Vitals  Encounter Vitals Group     BP 07/30/24 1115 (!) 150/86     Girls Systolic BP Percentile --      Girls Diastolic BP Percentile --      Boys Systolic BP Percentile --      Boys Diastolic BP Percentile --      Pulse Rate 07/30/24 1115 (!) 116     Resp 07/30/24 1115 20     Temp 07/30/24 1116 97.6 F (36.4 C)     Temp src --      SpO2 07/30/24 1115 98 %     Weight --      Height --      Head Circumference --      Peak Flow --      Pain Score --      Pain Loc --      Pain Education --      Exclude from Growth Chart --     Most recent vital signs: Vitals:   07/30/24 1130 07/30/24 1200  BP: (!) 141/84 (!) 141/86  Pulse: 98 (!) 101  Resp: 19   Temp:    SpO2: 99% 98%     General: Awake, no distress.  CV:  Good peripheral perfusion.  Resp:  Normal effort.  Abd:  No distention.  Other:  Erythema over the plantar and dorsal surfaces of the right foot extending midway up the calf   ED Results / Procedures / Treatments   Labs (all labs ordered are listed, but only abnormal results are displayed) Labs Reviewed  CBC - Abnormal; Notable for the following components:      Result Value   Hemoglobin 12.6 (*)    HCT 36.0 (*)    All other components within normal limits  BASIC METABOLIC PANEL WITH  GFR - Abnormal; Notable for the following components:   Sodium 129 (*)    Chloride 92 (*)    CO2 18 (*)    Glucose, Bld 592 (*)    Anion gap 19 (*)    All other components within normal limits  BETA-HYDROXYBUTYRIC ACID     EKG     RADIOLOGY I, Reche CHRISTELLA Leventhal, personally viewed and evaluated these images (plain radiographs) as part of my medical decision making, as well as reviewing the written report by the radiologist.  Right foot XR: IMPRESSION: 1. Navicular fracture of uncertain chronicity. Osteomyelitis is difficult to exclude. 2. Soft tissue ulceration along the plantar surface of the forefoot and hindfoot with diffuse soft tissue swelling.    PROCEDURES:  Critical Care performed: No  Procedures   MEDICATIONS ORDERED IN ED: Medications  sodium chloride  0.9 % bolus 1,000 mL (has no administration in time range)  IMPRESSION / MDM / ASSESSMENT AND PLAN / ED COURSE  I reviewed the triage vital signs and the nursing notes.                              Differential diagnosis includes, but is not limited to, cellulitis, osteomyelitis, sepsis  Patient's presentation is most consistent with acute presentation with potential threat to life or bodily function.  Patient is a 49 year old male with past medical history of hypertension, diabetes, seizures, presenting to the emergency department complaining of increased swelling and redness to his right foot concerning for infection.  Work and x-ray ordered for further evaluation.  On workup the patient's white blood cell count is normal however he does have a significantly elevated glucose as well is a decreased bicarb and increased anion gap.  Beta hydroxybutyrate added to workup.  IV fluids ordered.  X-ray cannot exclude osteomyelitis.  MRI ordered.  Patient's beta hydroxybutyrate is elevated.  Given lab findings he will be started on insulin  drip along with continued fluids.  I discussed findings at length  with the patient.  Discussed that I would like to admit him for further workup and treatment and he is agreeable.  I discussed the patient's case with the hospitalist who is in agreement with plan for admission at this time.      FINAL CLINICAL IMPRESSION(S) / ED DIAGNOSES   Final diagnoses:  Cellulitis of right foot  Diabetic ketoacidosis without coma associated with type 2 diabetes mellitus (HCC)     Rx / DC Orders   ED Discharge Orders     None        Note:  This document was prepared using Dragon voice recognition software and may include unintentional dictation errors.   Rexford Reche HERO, MD 07/30/24 1426  "

## 2024-07-30 NOTE — ED Notes (Signed)
MRI screening pt 

## 2024-07-30 NOTE — ED Notes (Signed)
 Delay in VANC initiation due to K+ isnt complete. Was slowed due to burning sensation. Once complete will initiate VANC.

## 2024-07-30 NOTE — Consult Note (Signed)
 ED Pharmacy Antibiotic Sign Off An antibiotic consult was received from an ED provider for wound infection per pharmacy dosing for vancomycin . A chart review was completed to assess appropriateness.   The following one time order(s) were placed:  Vancomycin  2,500 mg x1  Further antibiotic and/or antibiotic pharmacy consults should be ordered by the admitting provider if indicated.   Thank you for allowing pharmacy to be a part of this patient's care.   Annabella LOISE Banks, [facility]  Clinical Pharmacist 07/30/24 5:34 PM

## 2024-07-30 NOTE — Consult Note (Addendum)
 " ORTHOPAEDIC CONSULTATION  REQUESTING PHYSICIAN: Laurita Cort DASEN, MD  Chief Complaint: Right foot infection  HPI: Daniel Ritter is a 49 y.o. male who complains of worsening swelling and likely infection to his right foot.  Longstanding history of diabetes with neuropathy.  History of multiple diabetic foot ulcers.  Developed a foot ulcer to the right great toe joint has been seen in the outpatient clinic.  He states over the last couple of days he has noticed worsening swelling in his right foot and was concerned due to redness.  Denies fever or chills.  Upon admission white blood cell count was normal but marked elevation of his blood sugar greater than 400.  He is undergoing previous fifth ray amputation in the past.  He has had revascularization performed in the past as well.  Left foot without issue.  Past Medical History:  Diagnosis Date   Allergy    seasonal   Anxiety    Diabetes mellitus without complication (HCC)    GERD (gastroesophageal reflux disease)    H/O ETOH abuse    History of rectal abscess    Hypertension 2007   Neuropathy    Pancreatitis    history of, pt states due to alcohol   Seizures (HCC) 08/2014    two seizures in the hospital   Stroke [facility]) 08/31/2013   Thrombocytopenia    Past Surgical History:  Procedure Laterality Date   AMPUTATION TOE Right 01/30/2022   Procedure: PARTIAL 5TH RAY AMUPTATION RIGHT FOOT;  Surgeon: Ashley Soulier, DPM;  Location: [facility] ORS;  Service: Podiatry;  Laterality: Right;   ANTERIOR CRUCIATE LIGAMENT REPAIR Left 1992, 1993   COLONOSCOPY N/A 09/04/2021   Procedure: COLONOSCOPY;  Surgeon: Toledo, Ladell POUR, MD;  Location: [facility] ENDOSCOPY;  Service: Gastroenterology;  Laterality: N/A;  DM   INCISION AND DRAINAGE PERIRECTAL ABSCESS N/A 12/14/2015   Procedure: IRRIGATION AND DEBRIDEMENT PERIRECTAL ABSCESS;  Surgeon: Louanne KANDICE Muse, MD;  Location: [facility] ORS;  Service: General;  Laterality: N/A;   IRRIGATION AND DEBRIDEMENT FOOT  Bilateral 01/30/2022   Procedure: IRRIGATION AND DEBRIDEMENT TENDON ULCER LEFT FOOT,  EXCISION OF 2ND TOE JOINT LEFT FOOT, IRRIGATION AND DEBRIDEMENT SUBCUTANEOUS ULCER RIGHT FOOT;  Surgeon: Ashley Soulier, DPM;  Location: [facility] ORS;  Service: Podiatry;  Laterality: Bilateral;   IRRIGATION AND DEBRIDEMENT FOOT Right 05/01/2022   Procedure: IRRIGATION AND DEBRIDEMENT FOOT;SOFT TISSUE AND BONE;  Surgeon: Neill Krystal, DPM;  Location: [facility] ORS;  Service: Podiatry;  Laterality: Right;   LOWER EXTREMITY ANGIOGRAPHY Right 02/04/2022   Procedure: Lower Extremity Angiography;  Surgeon: Jama Cordella KANDICE, MD;  Location: [facility] IN[facility];  Service: Cardiovascular;  Laterality: Right;   LOWER EXTREMITY ANGIOGRAPHY Right 03/22/2024   Procedure: Lower Extremity Angiography;  Surgeon: Jama Cordella KANDICE, MD;  Location: [facility] IN[facility] CV LAB;  Service: Cardiovascular;  Laterality: Right;   Social History   Socioeconomic History   Marital status: Divorced    Spouse name: Not on file   Number of children: 0   Years of education: 14   Highest education level: Not on file  Occupational History   Occupation: merchandiser, retail    Employer: [employer]   Occupation: diabled  Tobacco Use   Smoking status: Former    Current packs/day: 0.00    Average packs/day: 0.3 packs/day for 18.0 years (4.5 ttl pk-yrs)    Types: Cigarettes    Start date: 04/11/2002    Quit date: 04/11/2020    Years since quitting: 4.3   Smokeless tobacco:  Former    Types: Snuff    Quit date: 2001   Tobacco comments:    Patient hasn't smoked in years.  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[place], no children   Right  handed   caffeine use -  2 cups coffee daily, usually decaf   Social Drivers of Health   Tobacco Use: Medium Risk (07/30/2024)   Patient History    Smoking Tobacco Use: Former    Smokeless Tobacco Use: Former    Passive Exposure: Not on Actuary Strain: Low Risk  (04/01/2024)   Received from [facility]   Overall Financial Resource Strain (CARDIA)    Difficulty of Paying Living Expenses: Not hard at all  Food Insecurity: No Food Insecurity (04/01/2024)   Received from [facility]   Epic    Within the past 12 months, you worried that your food would run out before you got the money to buy more.: Never true    Within the past 12 months, the food you bought just didn't last and you didn't have money to get more.: Never true  Transportation Needs: No Transportation Needs (04/01/2024)   Received from [facility] - Transportation    In the past 12 months, has lack of transportation kept you from medical appointments or from getting medications?: No    Lack of Transportation (Non-Medical): No  Physical Activity: Unknown (06/17/2023)   Received from [facility]   Exercise Vital Sign    On average, how many days per week do you engage in moderate to strenuous exercise (like a brisk walk)?: 6 days    Minutes of Exercise per Session: Not on file  Stress: Stress Concern Present (06/17/2023)   Received from [facility] - Occupational Stress Questionnaire    Feeling of Stress : Rather much  Social Connections: Moderately Isolated (06/17/2023)   Received from [facility]   Social Connection and Isolation Panel    In a typical week, how many times do you talk on the phone with family, friends, or neighbors?: More than three times a week    How often do you get together with friends or relatives?: Three times a week    How  often do you attend church or religious services?: 1 to 4 times per year    Do you belong to any clubs or organizations such as church groups, unions, fraternal or athletic groups, or school groups?: No    How often do you attend meetings of the clubs or organizations you belong to?: Never    Are you married, widowed, divorced, separated, never married, or living with a partner?: Divorced  Depression (PHQ2-9): Low Risk (03/18/2022)   Depression (PHQ2-9)    PHQ-2 Score: 1  Alcohol Screen: Not on file  Housing: Low Risk  (04/01/2024)   Received from [facility]   Epic    In the last 12 months, was there a time when you were not able to  pay the mortgage or rent on time?: No    In the past 12 months, how many times have you moved where you were living?: 0    At any time in the past 12 months, were you homeless or living in a shelter (including now)?: No  Utilities: Not At Risk (04/01/2024)   Received from [facility]   Epic    In the past 12 months has the electric, gas, oil, or water  company threatened to shut off services in your home?: No  Health Literacy: Inadequate Health Literacy (06/17/2023)   Received from [facility]   B1300 Health Literacy    Frequency of need for help with medical instructions: Sometimes   Family History  Problem Relation Age of Onset   Hypertension Mother    Anxiety disorder Mother    Hypertension Father    Hypertension Brother    Leukemia Maternal Grandfather    Lung cancer Paternal Grandfather    Allergies[1] Prior to Admission medications  Medication Sig Start Date End Date Taking? Authorizing Provider  acetaminophen  (TYLENOL ) 325 MG tablet Take 2 tablets (650 mg total) by mouth every 6 (six) hours as needed for mild pain (or Fever >/= 101). 02/05/22  Yes Jens Durand, MD  ALPRAZolam  (XANAX ) 0.25 MG tablet Take 0.25 mg by mouth 2 (two) times daily. Patient taking differently: Take 0.25 mg by mouth 2  (two) times daily as needed.   Yes [provider]  levETIRAcetam  (KEPPRA ) 500 MG tablet Take 1 tablet (500 mg total) by mouth 2 (two) times daily. 09/04/14  Yes Noemi Reena CROME, NP  lisinopril -hydrochlorothiazide  (ZESTORETIC ) 20-25 MG tablet Take 1 tablet by mouth daily. 10/31/22  Yes [provider]  metoprolol  succinate (TOPROL -XL) 100 MG 24 hr tablet Take 100 mg by mouth daily. Take with or immediately following a meal.   Yes [provider]  amLODipine  (NORVASC ) 10 MG tablet Take 10 mg by mouth daily as needed. 09/02/21   [provider]  ascorbic acid  (VITAMIN C ) 500 MG tablet Take 1 tablet (500 mg total) by mouth 2 (two) times daily. Patient not taking: Reported on 07/30/2024 01/15/23   Tobie Calix, MD  aspirin  EC 81 MG tablet Take 1 tablet (81 mg total) by mouth daily. Swallow whole. Patient not taking: Reported on 07/30/2024 03/23/24 03/23/25  Schnier, Cordella MATSU, MD  atorvastatin  (LIPITOR) 20 MG tablet Take 20 mg by mouth daily. Patient not taking: Reported on 07/30/2024 03/04/24   [provider]  chlorhexidine  (PERIDEX ) 0.12 % solution Use as directed 15 mLs in the mouth or throat 2 (two) times daily. Patient not taking: Reported on 03/22/2024 10/22/22   [provider]  clopidogrel  (PLAVIX ) 75 MG tablet Take 1 tablet (75 mg total) by mouth daily. Patient not taking: Reported on 07/30/2024 03/23/24   Schnier, Cordella MATSU, MD  DULoxetine  (CYMBALTA ) 30 MG capsule Take 30 mg by mouth daily. Patient not taking: Reported on 07/30/2024 11/08/21   [provider]  JANUVIA 100 MG tablet Take 100 mg by mouth daily. Patient not taking: Reported on 03/22/2024 04/30/20   [provider]  metFORMIN  (GLUCOPHAGE ) 1000 MG tablet Take 1,000 mg by mouth 2 (two) times daily with a meal.    [provider]  Multiple Vitamins-Minerals (MULTIVITAMIN WITH MINERALS) tablet Take 1 tablet by mouth daily.    [provider]  pantoprazole   (PROTONIX ) 40 MG tablet Take 1 tablet (40 mg total) by mouth daily. Patient not taking: Reported  on 07/30/2024 03/22/24 03/22/25  Schnier, Cordella MATSU, MD  triamcinolone  cream (KENALOG ) 0.5 % Apply topically 2 (two) times daily. Patient not taking: Reported on 07/30/2024 01/15/23   Patel, Sona, MD  zinc  sulfate 220 (50 Zn) MG capsule Take 1 capsule (220 mg total) by mouth daily. Patient not taking: Reported on 07/30/2024 01/16/23   Patel, Sona, MD   US  Venous Img Lower Unilateral Right (DVT) Result Date: 07/30/2024 EXAM: ULTRASOUND DUPLEX OF THE RIGHT LOWER EXTREMITY VEINS TECHNIQUE: Duplex ultrasound using B-mode/gray scaled imaging and Doppler spectral analysis and color flow was obtained of the deep venous structures of the right lower extremity. COMPARISON: Comparison with 01/13/2023. CLINICAL HISTORY: Swelling. ICD10: [id number] Injury of blood vessels of thorax without mention of open wound into cavity. FINDINGS: The common femoral vein, femoral vein, popliteal vein, and posterior tibial vein demonstrate normal compressibility with normal color flow and spectral analysis. IMPRESSION: 1. No evidence of deep venous thrombosis in the examined lower extremity. Electronically signed by: Elsie Gravely MD 07/30/2024 03:51 PM EST RP Workstation: [id number]   MR FOOT RIGHT W WO CONTRAST Result Date: 07/30/2024 EXAM: MRI of the Right Foot with and without contrast. 07/30/2024 03:15:52 PM TECHNIQUE: Multiplanar multisequence MRI of the right forefoot was performed with and without the administration of intravenous contrast. Contrast administered was 10 mL Gadobutrol  (GADAVIST ) 1 MMOL/ML injection. COMPARISON: Radiographs 07/30/2024. CLINICAL HISTORY: R foot infection, concern for osteomyelitis. Right foot infection, concern for osteomyelitis. 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 Visualized Lisfranc ligament is intact. No significant Lisfranc interval widening or  significant periligamentous edema. BONE MARROW: Low level edema signal and enhancement in the lateral sesamoid of the 1st digit as on image 22 series 10 suspicious for sesamoiditis or osteomyelitis. No other suspicious findings for osteomyelitis. No acute fracture or aggressive marrow replacing lesion. GREATER AND LESSER MTP JOINTS: Underlying the 1st metatarsophalangeal joint there is a cutaneous ulceration on image 22 series 4 with abnormal subcutaneous signal extending up towards the lateral sesamoid. No drainable abscess or joint effusion. No significant osseous erosions. No significant degenerative changes. Normal alignment. SOFT TISSUES: Interval change deepening ulcer of the medial right foot. Dorsal subcutaneous edema in the foot tracks and to the toes, cellulitis not excluded. Abnormal subcutaneous signal extending up towards the lateral sesamoid. No drainable abscess. TENDONS: Visualized flexor and extensor tendons are intact without tenosynovitis. IMPRESSION: 1. Low level edema signal and enhancement in the lateral sesamoid of the 1st digit, suspicious for sesamoiditis or osteomyelitis. 2. Deepening cutaneous ulceration underlying the 1st metatarsophalangeal joint with abnormal subcutaneous signal extending toward the lateral sesamoid, without drainable abscess or joint effusion. 3. Dorsal subcutaneous edema tracking to the toes, cellulitis not excluded. 4. 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 Electronically signed by: Ryan Salvage MD 07/30/2024 03:31 PM EST RP Workstation: [id number]   DG Foot Complete Right Result Date: 07/30/2024 EXAM: 3 OR MORE VIEW(S) XRAY OF THE RIGHT FOOT 07/30/2024 12:45:07 PM COMPARISON: 02/03/2024 CLINICAL HISTORY: Infection, pain, swelling. 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 Navicular fracture of uncertain chronicity. Osteomyelitis is not excluded. Mild degenerative changes at the 1st metatarsophalangeal joint.  No malalignment. SOFT TISSUES: Soft tissue ulceration along the plantar surface of the forefoot and hindfoot. Diffuse forefoot and soft tissue swelling. Soft tissue swelling about the plantar aspect of the hind foot. Diffuse vascular calcifications. IMPRESSION: 1. Navicular fracture of uncertain chronicity. Osteomyelitis is difficult to exclude.  2. Soft tissue ulceration along the plantar surface of the forefoot and hindfoot with diffuse soft tissue swelling. Electronically signed by: norman gatlin MD 07/30/2024 01:19 PM EST RP Workstation: [id number]    Positive ROS: All other systems have been reviewed and were otherwise negative with the exception of those mentioned in the HPI and as above.  12 point ROS was performed.  Physical Exam: General: Alert and oriented.  No apparent distress.  Vascular:  Left foot:Dorsalis Pedis:  diminished Posterior Tibial:  diminished  Right foot: Dorsalis Pedis:  present Posterior Tibial:  diminished  Neuro:absent gross sensation  Derm: Plantar medial right first MTPJ shows a moderate-sized open ulceration 2-1/2 cm in diameter.  Noted fibrotic tissue in the central aspect.  Probing deep through the subcutaneous tissue and towards the first webspace.  There is surrounding erythema to the area.  No purulence noted today.  Also on the plantar right heel a 1-1/2 cm ulceration.  This probes deep into the subcutaneous tissue as well.  Ortho/MS: Status post right fifth ray amputation.  Assessment: Osteomyelitis sesamoid right great toe joint Diabetic foot ulcer right first MTPJ full-thickness Diabetic foot ulcer right heel Type 2 diabetes with neuropathy  Plan: Deep wound culture performed to the right first MTPJ today.  I have evaluated both the MRI and x-rays.  It did show some uptake in the fibular sesamoid.  Given the fact this ulceration is quite extensive and probes towards the sesamoid region most likely this is infected/osteomyelitis.  He would do well  with debridement of the ulceration and removal of the sesamoid.  Will try to plan this early this coming week.  With the impending ice I believe he is stable to undergo IV antibiotics and we can schedule this in the next few days.  I did discuss the risks associated with both this procedure as well as the strong possibility of continued worsening wound and need for further debridement versus amputation of the great toe and joint region.  He understands my concerns.  The heel ulcer was fairly deep as well.  I was able to review the MRI which only covered the midfoot distally.  Will order MRI of the heel to evaluate for deep infection into the heel region.  He should remain nonweightbearing to this right foot.  Will follow and likely plan on surgery early this week.    Ashley Eva LABOR, DPM Cell [phone]   07/30/2024 7:34 PM      [1] No Known Allergies  "

## 2024-07-30 NOTE — ED Notes (Addendum)
 Attempted to call ICU for report. Beds ready. Per ICU bed is dirty and they will call us  back. ED Charge made aware.

## 2024-07-31 ENCOUNTER — Inpatient Hospital Stay

## 2024-07-31 ENCOUNTER — Other Ambulatory Visit: Payer: Self-pay

## 2024-07-31 DIAGNOSIS — E1165 Type 2 diabetes mellitus with hyperglycemia: Secondary | ICD-10-CM

## 2024-07-31 DIAGNOSIS — E11621 Type 2 diabetes mellitus with foot ulcer: Secondary | ICD-10-CM

## 2024-07-31 LAB — CBC
HCT: 29.4 % — ABNORMAL LOW (ref 39.0–52.0)
Hemoglobin: 10.5 g/dL — ABNORMAL LOW (ref 13.0–17.0)
MCH: 30.3 pg (ref 26.0–34.0)
MCHC: 35.7 g/dL (ref 30.0–36.0)
MCV: 84.7 fL (ref 80.0–100.0)
Platelets: 175 10*3/uL (ref 150–400)
RBC: 3.47 MIL/uL — ABNORMAL LOW (ref 4.22–5.81)
RDW: 12.6 % (ref 11.5–15.5)
WBC: 9.1 10*3/uL (ref 4.0–10.5)
nRBC: 0 % (ref 0.0–0.2)

## 2024-07-31 LAB — GLUCOSE, CAPILLARY
Glucose-Capillary: 128 mg/dL — ABNORMAL HIGH (ref 70–99)
Glucose-Capillary: 129 mg/dL — ABNORMAL HIGH (ref 70–99)
Glucose-Capillary: 130 mg/dL — ABNORMAL HIGH (ref 70–99)
Glucose-Capillary: 133 mg/dL — ABNORMAL HIGH (ref 70–99)
Glucose-Capillary: 160 mg/dL — ABNORMAL HIGH (ref 70–99)
Glucose-Capillary: 202 mg/dL — ABNORMAL HIGH (ref 70–99)
Glucose-Capillary: 266 mg/dL — ABNORMAL HIGH (ref 70–99)
Glucose-Capillary: 301 mg/dL — ABNORMAL HIGH (ref 70–99)
Glucose-Capillary: 320 mg/dL — ABNORMAL HIGH (ref 70–99)

## 2024-07-31 LAB — BASIC METABOLIC PANEL WITH GFR
Anion gap: 12 (ref 5–15)
BUN: 10 mg/dL (ref 6–20)
CO2: 24 mmol/L (ref 22–32)
Calcium: 9 mg/dL (ref 8.9–10.3)
Chloride: 103 mmol/L (ref 98–111)
Creatinine, Ser: 0.7 mg/dL (ref 0.61–1.24)
GFR, Estimated: >60 mL/min
Glucose, Bld: 150 mg/dL — ABNORMAL HIGH (ref 70–99)
Potassium: 3.6 mmol/L (ref 3.5–5.1)
Sodium: 138 mmol/L (ref 135–145)

## 2024-07-31 LAB — HEMOGLOBIN A1C
Hgb A1c MFr Bld: 11.5 % — ABNORMAL HIGH (ref 4.8–5.6)
Mean Plasma Glucose: 283.35 mg/dL

## 2024-07-31 LAB — BETA-HYDROXYBUTYRIC ACID: Beta-Hydroxybutyric Acid: 1.23 mmol/L — ABNORMAL HIGH (ref 0.05–0.27)

## 2024-07-31 LAB — HIV ANTIBODY (ROUTINE TESTING W REFLEX): HIV Screen 4th Generation wRfx: NONREACTIVE

## 2024-07-31 LAB — MRSA NEXT GEN BY PCR, NASAL: MRSA by PCR Next Gen: NOT DETECTED

## 2024-07-31 MED ORDER — ORAL CARE MOUTH RINSE: 15.0000 mL | OROMUCOSAL | Status: DC | PRN

## 2024-07-31 MED ORDER — INSULIN STARTER KIT- PEN NEEDLES (ENGLISH)
1.0000 | Freq: Once | Status: AC
  Administered 2024-07-31: 1
  Filled 2024-07-31: qty 1

## 2024-07-31 MED ORDER — INSULIN GLARGINE 100 UNIT/ML ~~LOC~~ SOLN
22.0000 [IU] | Freq: Every day | SUBCUTANEOUS | Status: DC
  Administered 2024-07-31 – 2024-08-02 (×3): 22 [IU] via SUBCUTANEOUS
  Filled 2024-07-31 (×4): qty 0.22

## 2024-07-31 MED ORDER — LIVING WELL WITH DIABETES BOOK
Freq: Once | Status: AC
  Filled 2024-07-31: qty 1

## 2024-07-31 NOTE — TOC Initial Note (Signed)
 Transition of Care [facility]) - Initial/Assessment Note    Patient Details  Name: Daniel Ritter MRN: [id number] Date of Birth: December 21, 1975  Transition of Care [facility]) CM/SW Contact:    Corrie JINNY Ruts, LCSW Phone Number: 07/31/2024, 12:57 PM  Clinical Narrative:                 Chart reviewed. I was able to speak with the patient via telephone call. I introduced myself, my role, and reason for consult. The patient reports that he is currently  looking for a new PCP. 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 SW offered PCP resources and the patient was accepting.   The patient reports that he lives by himself. The patient reports that he is able to complete ADL independently. The patient reports that he drives himself to medical appointments. The patient reports that his sister or brother will assist him at D/C.   The patient reports that he has never had HH or been admitted into a SNF in the past. The patient reports that he has only had a nurse come place a PICC line and and Physical therapist come see him but it wasn't HH.   The patient reports that he has all DME in the home. The patient reports his father just passed away and has all of his items.         Patient Goals and CMS Choice            Expected Discharge Plan and Services                                              Prior Living Arrangements/Services                       Activities of Daily Living   ADL Screening (condition at time of admission) Independently performs ADLs?: Yes (appropriate for developmental age) Is the patient deaf or have difficulty hearing?: No Does the patient have difficulty seeing, even when wearing glasses/contacts?: No Does the patient have difficulty concentrating, remembering, or making decisions?: No  Permission Sought/Granted                  Emotional Assessment              Admission diagnosis:  DKA (diabetic ketoacidosis)  (HCC) [E11.10] Cellulitis of right foot [L03.115] Diabetic ketoacidosis without coma associated with type 2 diabetes mellitus (HCC) [E11.10] Patient Active Problem List   Diagnosis Date Noted   Uncontrolled diabetes mellitus with hyperglycemia (HCC) 07/31/2024   Diabetic ulcer of right foot (HCC) 07/31/2024   DKA, type 2 (HCC) 07/30/2024   DKA (diabetic ketoacidosis) (HCC) 07/30/2024   GERD (gastroesophageal reflux disease) 07/16/2024   Cellulitis of right lower extremity 01/14/2023   Irritant contact dermatitis due to other agents 01/14/2023   Vasculitis 01/14/2023   History of CVA in adulthood 01/13/2023   PVD (peripheral vascular disease) 01/13/2023   Wound of right lower extremity 01/12/2023   Acute osteomyelitis (HCC) 05/01/2022   Lactic acidosis 05/01/2022   Malnutrition of moderate degree 01/30/2022   Sepsis due to cellulitis (HCC) 01/28/2022   Acute osteomyelitis of ankle or foot, right (HCC) 01/28/2022   Type 2 diabetes mellitus with peripheral neuropathy (HCC) 01/28/2022   Anxiety and depression 01/28/2022   Thrombocytopenia 05/02/2020  History of alcohol use 05/02/2020   Transaminitis 05/02/2020   Essential hypertension 11/08/2014   Type 2 diabetes mellitus without complication 11/08/2014   Anxiety 11/08/2014   Seizures (HCC) 11/08/2014   Uncontrolled hypertension 09/02/2014   Type 2 diabetes mellitus (HCC) 09/02/2014   Noncompliance with medications 09/02/2014   Convulsions/seizures (HCC) 09/02/2014   Alcohol abuse 09/02/2014   Hemorrhage    Cytotoxic brain edema (HCC)    ICH (intracerebral hemorrhage) (HCC) 08/31/2014   PCP:  Ernie Yancy Roof, MD Pharmacy:   [id number] [address], [place] - [address] AT [facility] OF [address] [place] [address] Phone: [phone] Fax: [phone]     Social Drivers of Health (SDOH) Social History: SDOH Screenings   Food Insecurity: No Food Insecurity  (07/30/2024)  Housing: Low Risk (07/30/2024)  Transportation Needs: No Transportation Needs (07/30/2024)  Utilities: Not At Risk (07/30/2024)  Depression (PHQ2-9): Low Risk (03/18/2022)  Financial Resource Strain: Low Risk  (04/01/2024)   Received from [facility]  Physical Activity: Unknown (06/17/2023)   Received from [facility]  Social Connections: Moderately Isolated (06/17/2023)   Received from [facility]  Stress: Stress Concern Present (06/17/2023)   Received from [facility]  Tobacco Use: Medium Risk (07/30/2024)  Health Literacy: Inadequate Health Literacy (06/17/2023)   Received from [facility]   SDOH Interventions:     Readmission Risk Interventions    01/14/2023    9:09 AM  Readmission Risk Prevention Plan  Post Dischage Appt Complete  Medication Screening Complete  Transportation Screening Complete

## 2024-07-31 NOTE — Hospital Course (Signed)
 49 y.o. male with medical history significant of IIDM, chronic right foot diabetic ulcer, PVD status post angioplasty and right popliteal stenting in September 2025, HTN, HLD, peripheral neuropathy, presented with worsening of right foot infection.    Assessment and Plan:   Right foot diabetic ulcers with concern for osteomyelitis - Evaluated by podiatry.  MRI of the foot showing concern for forefoot ulcer penetrating with sesamoid osteomyelitis.  Ulcer on heel noted to have no abscess or concern for osteomyelitis.  Currently on empiric Zosyn .  Scheduled to undergo further debridement later this morning.   Early diabetic ketoacidosis - Likely exacerbated by underlying infection.  On presentation, glucose greater than 500, mild acidemia, elevated beta hydroxybutyrate.  Was initiated on IV insulin  drip, DKA protocol.  Showed marked improvement in acidemia and glucose values.  Subsequently transition to subcu insulin .  DKA resolved.   Uncontrolled insulin  dependent diabetes mellitus with hyperglycemia - A1c 11.5 suggesting very poor control.  Currently on insulin  glargine 22 units nightly with insulin  sliding scale.  Added 4 units aspart with meal coverage.  Will continue to monitor glucose closely.  Will likely need new regimen upon discharge.   Hypertension/hyperlipidemia - Resume patient's home medication regimen.   Peripheral vascular disease - S/p recent angioplasty and right popliteal stenting.  Continue aspirin  but hold Plavix  for possible surgical intervention.

## 2024-07-31 NOTE — Plan of Care (Signed)
  Problem: Coping: Goal: Ability to adjust to condition or change in health will improve Outcome: Progressing   Problem: Fluid Volume: Goal: Ability to maintain a balanced intake and output will improve Outcome: Progressing   Problem: Health Behavior/Discharge Planning: Goal: Ability to identify and utilize available resources and services will improve Outcome: Progressing Goal: Ability to manage health-related needs will improve Outcome: Progressing   Problem: Metabolic: Goal: Ability to maintain appropriate glucose levels will improve Outcome: Progressing   Problem: Nutritional: Goal: Maintenance of adequate nutrition will improve Outcome: Progressing   Problem: Skin Integrity: Goal: Risk for impaired skin integrity will decrease Outcome: Progressing

## 2024-07-31 NOTE — Progress Notes (Signed)
 Patient received from ICU. Alert and oriented. Verbal. Room Air. Tele monitor applied. Skin assessed.  Daniel Ritter V Daniel Ritter

## 2024-07-31 NOTE — Progress Notes (Signed)
 " Progress Note   Patient: Daniel Ritter FMW:[id number] DOB: 1975-10-08 DOA: 07/30/2024  DOS: the patient was seen and examined on 07/31/2024   Brief hospital course:  49 y.o. male with medical history significant of IIDM, chronic right foot diabetic ulcer, PVD status post angioplasty and right popliteal stenting in September 2025, HTN, HLD, peripheral neuropathy, presented with worsening of right foot infection.   Assessment and Plan:  Right foot diabetic ulcers with concern for osteomyelitis - Evaluated by podiatry.  MRI of the foot showing concern for forefoot ulcer penetrating with sesamoid osteomyelitis.  Ulcer on heel noted to have no abscess or concern for osteomyelitis.  Currently on empiric Zosyn .  Likely to undergo further debridement early this coming week.  Early diabetic ketoacidosis - Likely exacerbated by underlying infection.  On presentation, glucose greater than 500, mild acidemia, elevated beta hydroxybutyrate.  Was initiated on IV insulin  drip, DKA protocol.  Showed marked improvement in acidemia and glucose values.  Subsequently transition to subcu insulin .  DKA resolved.  Uncontrolled insulin  dependent diabetes mellitus with hyperglycemia - A1c 11.5 suggesting very poor control.  Currently on insulin  glargine 22 units nightly with insulin  sliding scale.  Will continue to monitor glucose closely.  Hypertension/hyperlipidemia - Resume patient's home medication regimen.  Peripheral vascular disease - S/p recent angioplasty and right popliteal stenting.  Continue aspirin  but hold Plavix  for possible surgical intervention.  Subjective: Patient resting comfortably this morning, in good spirits.  States he feels well except for throbbing pain in his foot.  Denies any fever, chills, shortness of breath, chest pain, nausea vomiting, abdominal pain.  Physical Exam:  Vitals:   07/31/24 0500 07/31/24 0600 07/31/24 0700 07/31/24 0745  BP: 120/74 133/80 130/81   Pulse: 77 81  81   Resp: (!) 21 19 18    Temp:    98.9 F (37.2 C)  TempSrc:    Oral  SpO2: 95% 94% 96%   Weight:      Height:        GENERAL:  Alert, pleasant, no acute distress  HEENT:  EOMI CARDIOVASCULAR:  RRR, no murmurs appreciated RESPIRATORY:  Clear to auscultation, no wheezing, rales, or rhonchi GASTROINTESTINAL:  Soft, nontender, nondistended EXTREMITIES:  No LE edema bilaterally NEURO:  No new focal deficits appreciated SKIN: Right foot with fresh bandage PSYCH:  Appropriate mood and affect     Data Reviewed:  Imaging Studies: MR ANKLE RIGHT WO CONTRAST Result Date: 07/31/2024 EXAM: MRI of the right Ankle without contrast. 07/31/2024 08:49:14 AM TECHNIQUE: Multiplanar multisequence MRI of the right ankle was performed without the administration of intravenous contrast. COMPARISON: Forefoot MRI from 07/30/2024 and ankle radiographs from 02/03/2024. CLINICAL HISTORY: Ulcer right heel. FINDINGS: SYNDESMOTIC LIGAMENTS: Intact anterior inferior tibiofibular ligament and posterior inferior tibiofibular ligament. No significant periligamentous edema or interval widening. LATERAL COLLATERAL LIGAMENT COMPLEX: Intact anterior talofibular ligament, posterior talofibular ligament and calcaneofibular ligament. DELTOID LIGAMENT COMPLEX: Intact superficial and deep components. SINUS TARSI AND SPRING LIGAMENT: Thickened superomedial portion of the spring ligament with a fluid filled larger than normal gap between the medioplantar oblique and inferoplantar longitudinal portions of the spring ligament possibly reflecting the chronically fragmented navicular, suggesting some degree of tearing of the medioplantar oblique portion. Normal appearance of the sinus tarsi. MEDIAL TENDONS: Distal tibialis posterior tenosynovitis. Intact flexor digitorum and flexor hallucis longus tendons. LATERAL TENDONS: Mild common peroneal tendon sheath tenosynovitis with tendinopathy and potentially mild longitudinal split tearing of  the peroneus brevis tendon just below the lateral malleolus. Intact  peroneus longus tendon. ANTERIOR TENDONS: The tibialis anterior, extensor hallucis longus and extensor digitorum longus tendons are normal in position, morphology and signal. ACHILLES TENDON: The Achilles tendon is normal in position, morphology and signal. No associated bursitis. PLANTAR FASCIA: Plantar heel ulceration extending up towards the plantar fascia as on image 13 series 8, with associated edema in the subcutaneous tissues and extending to the superficial margin of the plantar fascia. No plantar fascia tear or abscess identified. The medial and lateral bundles of the plantar fascia are normal in morphology and signal. No plantar fascial nodules. Mild dorsal subcutaneous edema along the ankle. TARSAL TUNNEL: There are no obstructing lesions within the tarsal tunnel. 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 There is mostly fluid within the gap. Type 1 accessory navicular. No findings of active osteomyelitis. JOINT SPACES: No significant joint effusion. Low level arthropathy between the navicular fragments and the adjacent cuneiforms. Fissure dorsal talonavicular ligament in the vicinity of the gap/fracture, with dorsal talar spurring. Normal alignment. IMPRESSION: 1. Plantar heel ulceration extending toward the plantar fascia with adjacent subcutaneous edema, without abscess, plantar fascia tear, or active osteomyelitis. 2. Chronic nonunited sagittally oriented navicular fracture with 1.1 cm fragment separation and fluid within the gap. There is also a smaller inferior navicular fragment. 3. Low-grade arthropathy between the navicular fragments and adjacent cuneiforms, with fissuring of the dorsal talonavicular ligament and dorsal talar spurring. 4. Thickening of the  superomedial spring ligament with suspected tearing of the medioplantar oblique component. 5. Mild common peroneal tendon sheath tenosynovitis with tendinopathy and possible mild longitudinal split tearing of the peroneus brevis tendon just below the lateral malleolus. 6. Distal tibialis posterior tenosynovitis with type 1 accessory navicular. Electronically signed by: Ryan Salvage MD 07/31/2024 10:14 AM EST RP Workstation: [id number]   US  Venous Img Lower Unilateral Right (DVT) Result Date: 07/30/2024 EXAM: ULTRASOUND DUPLEX OF THE RIGHT LOWER EXTREMITY VEINS TECHNIQUE: Duplex ultrasound using B-mode/gray scaled imaging and Doppler spectral analysis and color flow was obtained of the deep venous structures of the right lower extremity. COMPARISON: Comparison with 01/13/2023. CLINICAL HISTORY: Swelling. ICD10: [id number] Injury of blood vessels of thorax without mention of open wound into cavity. FINDINGS: The common femoral vein, femoral vein, popliteal vein, and posterior tibial vein demonstrate normal compressibility with normal color flow and spectral analysis. IMPRESSION: 1. No evidence of deep venous thrombosis in the examined lower extremity. Electronically signed by: Elsie Gravely MD 07/30/2024 03:51 PM EST RP Workstation: [id number]   MR FOOT RIGHT W WO CONTRAST Result Date: 07/30/2024 EXAM: MRI of the Right Foot with and without contrast. 07/30/2024 03:15:52 PM TECHNIQUE: Multiplanar multisequence MRI of the right forefoot was performed with and without the administration of intravenous contrast. Contrast administered was 10 mL Gadobutrol  (GADAVIST ) 1 MMOL/ML injection. COMPARISON: Radiographs 07/30/2024. CLINICAL HISTORY: R foot infection, concern for osteomyelitis. Right foot infection, concern for osteomyelitis. 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 Visualized Lisfranc ligament is intact. No significant Lisfranc interval widening or significant  periligamentous edema. BONE MARROW: Low level edema signal and enhancement in the lateral sesamoid of the 1st digit as on image 22 series 10 suspicious for sesamoiditis or osteomyelitis. No other suspicious findings for osteomyelitis. No acute fracture or aggressive marrow replacing lesion. GREATER AND LESSER MTP JOINTS: Underlying the 1st metatarsophalangeal joint there is a cutaneous  ulceration on image 22 series 4 with abnormal subcutaneous signal extending up towards the lateral sesamoid. No drainable abscess or joint effusion. No significant osseous erosions. No significant degenerative changes. Normal alignment. SOFT TISSUES: Interval change deepening ulcer of the medial right foot. Dorsal subcutaneous edema in the foot tracks and to the toes, cellulitis not excluded. Abnormal subcutaneous signal extending up towards the lateral sesamoid. No drainable abscess. TENDONS: Visualized flexor and extensor tendons are intact without tenosynovitis. IMPRESSION: 1. Low level edema signal and enhancement in the lateral sesamoid of the 1st digit, suspicious for sesamoiditis or osteomyelitis. 2. Deepening cutaneous ulceration underlying the 1st metatarsophalangeal joint with abnormal subcutaneous signal extending toward the lateral sesamoid, without drainable abscess or joint effusion. 3. Dorsal subcutaneous edema tracking to the toes, cellulitis not excluded. 4. 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 Electronically signed by: Ryan Salvage MD 07/30/2024 03:31 PM EST RP Workstation: [id number]   DG Foot Complete Right Result Date: 07/30/2024 EXAM: 3 OR MORE VIEW(S) XRAY OF THE RIGHT FOOT 07/30/2024 12:45:07 PM COMPARISON: 02/03/2024 CLINICAL HISTORY: Infection, pain, swelling. 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 Navicular fracture of uncertain chronicity. Osteomyelitis is not excluded. Mild degenerative changes at the 1st metatarsophalangeal joint. No  malalignment. SOFT TISSUES: Soft tissue ulceration along the plantar surface of the forefoot and hindfoot. Diffuse forefoot and soft tissue swelling. Soft tissue swelling about the plantar aspect of the hind foot. Diffuse vascular calcifications. IMPRESSION: 1. Navicular fracture of uncertain chronicity. Osteomyelitis is difficult to exclude. 2. Soft tissue ulceration along the plantar surface of the forefoot and hindfoot with diffuse soft tissue swelling. Electronically signed by: norman gatlin MD 07/30/2024 01:19 PM EST RP Workstation: [id number]    There are no new results to review at this time.  Previous records (including but not limited to H&P, progress notes, nursing notes, TOC management) were reviewed in assessment of this patient.  Labs: CBC: Recent Labs  Lab 07/30/24 1129 07/31/24 0359  WBC 9.0 9.1  HGB 12.6* 10.5*  HCT 36.0* 29.4*  MCV 84.5 84.7  PLT 187 175   Basic Metabolic Panel: Recent Labs  Lab 07/30/24 1129 07/30/24 1445 07/30/24 1839 07/30/24 2246 07/31/24 0359  NA 129* 136 138 137 138  K 4.6 3.7 3.5 3.5 3.6  CL 92* 101 102 101 103  CO2 18* 24 25 25 24   GLUCOSE 592* 421* 178* 154* 150*  BUN 15 13 11 11 10   CREATININE 0.92 0.73 0.61 0.66 0.70  CALCIUM  9.1 8.7* 9.2 9.2 9.0   Liver Function Tests: No results for input(s): AST, ALT, ALKPHOS, BILITOT, PROT, ALBUMIN in the last 168 hours. CBG: Recent Labs  Lab 07/31/24 0130 07/31/24 0233 07/31/24 0344 07/31/24 0808 07/31/24 1116  GLUCAP 130* 133* 128* 202* 266*    Scheduled Meds:  Chlorhexidine  Gluconate Cloth  6 each Topical Q2200   enoxaparin  (LOVENOX ) injection  55 mg Subcutaneous Q24H   lisinopril   20 mg Oral Daily   And   hydrochlorothiazide   25 mg Oral Daily   insulin  aspart  0-20 Units Subcutaneous TID WC   insulin  aspart  0-5 Units Subcutaneous QHS   insulin  glargine-yfgn  22 Units Subcutaneous QHS   insulin  starter kit- pen needles  1 kit Other Once   levETIRAcetam   500 mg  Oral BID   living well with diabetes book   Does not apply Once   metoprolol  succinate  100 mg Oral Daily   Continuous Infusions:  piperacillin -tazobactam (  ZOSYN )  IV 12.5 mL/hr at 07/31/24 0700   PRN Meds:.ALPRAZolam , dextrose , HYDROmorphone  (DILAUDID ) injection, ondansetron  **OR** ondansetron  (ZOFRAN ) IV, mouth rinse, senna-docusate, traZODone   Family Communication: None at bedside  Disposition: Status is: Inpatient Remains inpatient appropriate because: Diabetic ulcer/osteomyelitis     Time spent: 40 minutes  Length of inpatient stay: 1 days  Author: Carliss LELON Canales, DO 07/31/2024 12:40 PM  For on call review [email or web address].   "

## 2024-07-31 NOTE — Progress Notes (Signed)
 Report has been called to [facility]. Report given to Lanora S, RN. The patient is currently in room 228. Patient reports he will update family on his transfer to [facility].

## 2024-07-31 NOTE — Discharge Instructions (Signed)
 Some PCP options in [place] area- not a comprehensive list  [facility]- [phone] [facility]- [phone] [facility]- [phone] [facility]- [phone] [facility]- [phone] Nichole Molly- [phone]  or [facility] Physician Referral Line [phone]

## 2024-07-31 NOTE — Plan of Care (Signed)

## 2024-07-31 NOTE — Inpatient Diabetes Management (Signed)
 Inpatient Diabetes Program Recommendations  AACE/ADA: New Consensus Statement on Inpatient Glycemic Control (2015)  Target Ranges:  Prepandial:   less than 140 mg/dL      Peak postprandial:   less than 180 mg/dL (1-2 hours)      Critically ill patients:  140 - 180 mg/dL   Lab Results  Component Value Date   GLUCAP 202 (H) 07/31/2024   HGBA1C 11.5 (H) 07/30/2024     Diabetes history: DM2 Outpatient Diabetes medications:  Metformin  1 gm bid Januvia daily Current orders for Inpatient glycemic control: Novolog  0-20 units tid, 0-5 units hs correction  Inpatient Diabetes Program Recommendations:   Patient received Lantus  22 units prior to insulin  drip D/C'd. Please consider: -Continue Lantus  22 units nightly  Ordered Living Well With Diabetes booklet along with insulin  pen starter kit to prepare if discharged home on insulin . Attempted to call but unable to get answer on either. Will followup in am. Nurses, please have patient give own injections and educate on insulin  pen using insulin  pen teaching station as appropriate.  Thank you, Wilhemenia Camba E. Anet Logsdon, RN, MSN, CNS, CDCES  Diabetes Coordinator Inpatient Glycemic Control Team Team Pager [phone] (8am-5pm) 07/31/2024 10:04 AM

## 2024-08-01 ENCOUNTER — Telehealth (HOSPITAL_COMMUNITY): Payer: Self-pay

## 2024-08-01 ENCOUNTER — Other Ambulatory Visit (HOSPITAL_COMMUNITY): Payer: Self-pay

## 2024-08-01 DIAGNOSIS — E1165 Type 2 diabetes mellitus with hyperglycemia: Secondary | ICD-10-CM

## 2024-08-01 DIAGNOSIS — E1110 Type 2 diabetes mellitus with ketoacidosis without coma: Secondary | ICD-10-CM

## 2024-08-01 DIAGNOSIS — M8610 Other acute osteomyelitis, unspecified site: Secondary | ICD-10-CM

## 2024-08-01 LAB — GLUCOSE, CAPILLARY
Glucose-Capillary: 174 mg/dL — ABNORMAL HIGH (ref 70–99)
Glucose-Capillary: 219 mg/dL — ABNORMAL HIGH (ref 70–99)
Glucose-Capillary: 268 mg/dL — ABNORMAL HIGH (ref 70–99)
Glucose-Capillary: 211 mg/dL — ABNORMAL HIGH (ref 70–99)
Glucose-Capillary: 209 mg/dL — ABNORMAL HIGH (ref 70–99)
Glucose-Capillary: 150 mg/dL — ABNORMAL HIGH (ref 70–99)

## 2024-08-01 LAB — CBC
HCT: 29 % — ABNORMAL LOW (ref 39.0–52.0)
Hemoglobin: 9.9 g/dL — ABNORMAL LOW (ref 13.0–17.0)
MCH: 29.7 pg (ref 26.0–34.0)
MCHC: 34.1 g/dL (ref 30.0–36.0)
MCV: 87.1 fL (ref 80.0–100.0)
Platelets: 206 10*3/uL (ref 150–400)
RBC: 3.33 MIL/uL — ABNORMAL LOW (ref 4.22–5.81)
RDW: 13 % (ref 11.5–15.5)
WBC: 9.6 10*3/uL (ref 4.0–10.5)
nRBC: 0 % (ref 0.0–0.2)

## 2024-08-01 LAB — BASIC METABOLIC PANEL WITH GFR
Anion gap: 10 (ref 5–15)
BUN: 10 mg/dL (ref 6–20)
CO2: 24 mmol/L (ref 22–32)
Calcium: 9.2 mg/dL (ref 8.9–10.3)
Chloride: 101 mmol/L (ref 98–111)
Creatinine, Ser: 0.7 mg/dL (ref 0.61–1.24)
GFR, Estimated: >60 mL/min
Glucose, Bld: 177 mg/dL — ABNORMAL HIGH (ref 70–99)
Potassium: 3.8 mmol/L (ref 3.5–5.1)
Sodium: 135 mmol/L (ref 135–145)

## 2024-08-01 MED ORDER — CHLORHEXIDINE GLUCONATE 4 % EX SOLN: 60.0000 mL | Freq: Once | CUTANEOUS | Status: DC

## 2024-08-01 MED ORDER — INSULIN ASPART 100 UNIT/ML IJ SOLN
4.0000 [IU] | Freq: Three times a day (TID) | INTRAMUSCULAR | Status: DC
  Administered 2024-08-01 – 2024-08-04 (×9): 4 [IU] via SUBCUTANEOUS
  Filled 2024-08-01 (×10): qty 4

## 2024-08-01 MED ORDER — POVIDONE-IODINE 10 % EX SWAB: 2.0000 | Freq: Once | CUTANEOUS | Status: DC

## 2024-08-01 NOTE — Progress Notes (Signed)
 " Progress Note   Patient: Daniel Ritter FMW:[id number] DOB: 09-02-1975 DOA: 07/30/2024  DOS: the patient was seen and examined on 08/01/2024   Brief hospital course:  49 y.o. male with medical history significant of IIDM, chronic right foot diabetic ulcer, PVD status post angioplasty and right popliteal stenting in September 2025, HTN, HLD, peripheral neuropathy, presented with worsening of right foot infection.    Assessment and Plan:   Right foot diabetic ulcers with concern for osteomyelitis - Evaluated by podiatry.  MRI of the foot showing concern for forefoot ulcer penetrating with sesamoid osteomyelitis.  Ulcer on heel noted to have no abscess or concern for osteomyelitis.  Currently on empiric Zosyn .  Likely to undergo further debridement early this week.   Early diabetic ketoacidosis - Likely exacerbated by underlying infection.  On presentation, glucose greater than 500, mild acidemia, elevated beta hydroxybutyrate.  Was initiated on IV insulin  drip, DKA protocol.  Showed marked improvement in acidemia and glucose values.  Subsequently transition to subcu insulin .  DKA resolved.   Uncontrolled insulin  dependent diabetes mellitus with hyperglycemia - A1c 11.5 suggesting very poor control.  Currently on insulin  glargine 22 units nightly with insulin  sliding scale.  Adding 4 units aspart with meal coverage.  Will continue to monitor glucose closely.   Hypertension/hyperlipidemia - Resume patient's home medication regimen.   Peripheral vascular disease - S/p recent angioplasty and right popliteal stenting.  Continue aspirin  but hold Plavix  for possible surgical intervention.   Subjective: Patient resting comfortably this morning.  In good spirits.  Denies any fever, chills, chest pain, nausea, vomiting, abdominal pain.  Physical Exam:  Vitals:   07/31/24 1400 07/31/24 1956 08/01/24 0406 08/01/24 0751  BP: 116/75 117/67 122/82 124/79  Pulse: 77 77 81 79  Resp: 16 18 18 18    Temp:  98.5 F (36.9 C) 98.7 F (37.1 C) 98.2 F (36.8 C)  TempSrc:    Oral  SpO2: 96% 97% 96% 97%  Weight:      Height:        GENERAL:  Alert, pleasant, no acute distress  HEENT:  EOMI CARDIOVASCULAR:  RRR, no murmurs appreciated RESPIRATORY:  Clear to auscultation, no wheezing, rales, or rhonchi GASTROINTESTINAL:  Soft, nontender, nondistended EXTREMITIES:  No LE edema bilaterally NEURO:  No new focal deficits appreciated SKIN: Right foot with fresh bandage, heel protectors in place bilaterally PSYCH:  Appropriate mood and affect    Data Reviewed:  Imaging Studies: MR ANKLE RIGHT WO CONTRAST Result Date: 07/31/2024 EXAM: MRI of the right Ankle without contrast. 07/31/2024 08:49:14 AM TECHNIQUE: Multiplanar multisequence MRI of the right ankle was performed without the administration of intravenous contrast. COMPARISON: Forefoot MRI from 07/30/2024 and ankle radiographs from 02/03/2024. CLINICAL HISTORY: Ulcer right heel. FINDINGS: SYNDESMOTIC LIGAMENTS: Intact anterior inferior tibiofibular ligament and posterior inferior tibiofibular ligament. No significant periligamentous edema or interval widening. LATERAL COLLATERAL LIGAMENT COMPLEX: Intact anterior talofibular ligament, posterior talofibular ligament and calcaneofibular ligament. DELTOID LIGAMENT COMPLEX: Intact superficial and deep components. SINUS TARSI AND SPRING LIGAMENT: Thickened superomedial portion of the spring ligament with a fluid filled larger than normal gap between the medioplantar oblique and inferoplantar longitudinal portions of the spring ligament possibly reflecting the chronically fragmented navicular, suggesting some degree of tearing of the medioplantar oblique portion. Normal appearance of the sinus tarsi. MEDIAL TENDONS: Distal tibialis posterior tenosynovitis. Intact flexor digitorum and flexor hallucis longus tendons. LATERAL TENDONS: Mild common peroneal tendon sheath tenosynovitis with tendinopathy and  potentially mild longitudinal split tearing of the  peroneus brevis tendon just below the lateral malleolus. Intact peroneus longus tendon. ANTERIOR TENDONS: The tibialis anterior, extensor hallucis longus and extensor digitorum longus tendons are normal in position, morphology and signal. ACHILLES TENDON: The Achilles tendon is normal in position, morphology and signal. No associated bursitis. PLANTAR FASCIA: Plantar heel ulceration extending up towards the plantar fascia as on image 13 series 8, with associated edema in the subcutaneous tissues and extending to the superficial margin of the plantar fascia. No plantar fascia tear or abscess identified. The medial and lateral bundles of the plantar fascia are normal in morphology and signal. No plantar fascial nodules. Mild dorsal subcutaneous edema along the ankle. TARSAL TUNNEL: There are no obstructing lesions within the tarsal tunnel. 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 There is mostly fluid within the gap. Type 1 accessory navicular. No findings of active osteomyelitis. JOINT SPACES: No significant joint effusion. Low level arthropathy between the navicular fragments and the adjacent cuneiforms. Fissure dorsal talonavicular ligament in the vicinity of the gap/fracture, with dorsal talar spurring. Normal alignment. IMPRESSION: 1. Plantar heel ulceration extending toward the plantar fascia with adjacent subcutaneous edema, without abscess, plantar fascia tear, or active osteomyelitis. 2. Chronic nonunited sagittally oriented navicular fracture with 1.1 cm fragment separation and fluid within the gap. There is also a smaller inferior navicular fragment. 3. Low-grade arthropathy between the navicular fragments and adjacent cuneiforms, with fissuring of the dorsal talonavicular ligament and  dorsal talar spurring. 4. Thickening of the superomedial spring ligament with suspected tearing of the medioplantar oblique component. 5. Mild common peroneal tendon sheath tenosynovitis with tendinopathy and possible mild longitudinal split tearing of the peroneus brevis tendon just below the lateral malleolus. 6. Distal tibialis posterior tenosynovitis with type 1 accessory navicular. Electronically signed by: Ryan Salvage MD 07/31/2024 10:14 AM EST RP Workstation: [id number]   US  Venous Img Lower Unilateral Right (DVT) Result Date: 07/30/2024 EXAM: ULTRASOUND DUPLEX OF THE RIGHT LOWER EXTREMITY VEINS TECHNIQUE: Duplex ultrasound using B-mode/gray scaled imaging and Doppler spectral analysis and color flow was obtained of the deep venous structures of the right lower extremity. COMPARISON: Comparison with 01/13/2023. CLINICAL HISTORY: Swelling. ICD10: [id number] Injury of blood vessels of thorax without mention of open wound into cavity. FINDINGS: The common femoral vein, femoral vein, popliteal vein, and posterior tibial vein demonstrate normal compressibility with normal color flow and spectral analysis. IMPRESSION: 1. No evidence of deep venous thrombosis in the examined lower extremity. Electronically signed by: Elsie Gravely MD 07/30/2024 03:51 PM EST RP Workstation: [id number]   MR FOOT RIGHT W WO CONTRAST Result Date: 07/30/2024 EXAM: MRI of the Right Foot with and without contrast. 07/30/2024 03:15:52 PM TECHNIQUE: Multiplanar multisequence MRI of the right forefoot was performed with and without the administration of intravenous contrast. Contrast administered was 10 mL Gadobutrol  (GADAVIST ) 1 MMOL/ML injection. COMPARISON: Radiographs 07/30/2024. CLINICAL HISTORY: R foot infection, concern for osteomyelitis. Right foot infection, concern for osteomyelitis. 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 Visualized Lisfranc ligament is intact. No significant  Lisfranc interval widening or significant periligamentous edema. BONE MARROW: Low level edema signal and enhancement in the lateral sesamoid of the 1st digit as on image 22 series 10 suspicious for sesamoiditis or osteomyelitis. No other suspicious findings for osteomyelitis. No acute fracture or aggressive marrow replacing lesion. GREATER AND LESSER MTP JOINTS:  Underlying the 1st metatarsophalangeal joint there is a cutaneous ulceration on image 22 series 4 with abnormal subcutaneous signal extending up towards the lateral sesamoid. No drainable abscess or joint effusion. No significant osseous erosions. No significant degenerative changes. Normal alignment. SOFT TISSUES: Interval change deepening ulcer of the medial right foot. Dorsal subcutaneous edema in the foot tracks and to the toes, cellulitis not excluded. Abnormal subcutaneous signal extending up towards the lateral sesamoid. No drainable abscess. TENDONS: Visualized flexor and extensor tendons are intact without tenosynovitis. IMPRESSION: 1. Low level edema signal and enhancement in the lateral sesamoid of the 1st digit, suspicious for sesamoiditis or osteomyelitis. 2. Deepening cutaneous ulceration underlying the 1st metatarsophalangeal joint with abnormal subcutaneous signal extending toward the lateral sesamoid, without drainable abscess or joint effusion. 3. Dorsal subcutaneous edema tracking to the toes, cellulitis not excluded. 4. 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 Electronically signed by: Ryan Salvage MD 07/30/2024 03:31 PM EST RP Workstation: [id number]   DG Foot Complete Right Result Date: 07/30/2024 EXAM: 3 OR MORE VIEW(S) XRAY OF THE RIGHT FOOT 07/30/2024 12:45:07 PM COMPARISON: 02/03/2024 CLINICAL HISTORY: Infection, pain, swelling. 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 Navicular fracture of uncertain chronicity. Osteomyelitis is not excluded. Mild degenerative changes at the  1st metatarsophalangeal joint. No malalignment. SOFT TISSUES: Soft tissue ulceration along the plantar surface of the forefoot and hindfoot. Diffuse forefoot and soft tissue swelling. Soft tissue swelling about the plantar aspect of the hind foot. Diffuse vascular calcifications. IMPRESSION: 1. Navicular fracture of uncertain chronicity. Osteomyelitis is difficult to exclude. 2. Soft tissue ulceration along the plantar surface of the forefoot and hindfoot with diffuse soft tissue swelling. Electronically signed by: norman gatlin MD 07/30/2024 01:19 PM EST RP Workstation: [id number]    There are no new results to review at this time.  Previous records (including but not limited to H&P, progress notes, nursing notes, TOC management) were reviewed in assessment of this patient.  Labs: CBC: Recent Labs  Lab 07/30/24 1129 07/31/24 0359 08/01/24 0533  WBC 9.0 9.1 9.6  HGB 12.6* 10.5* 9.9*  HCT 36.0* 29.4* 29.0*  MCV 84.5 84.7 87.1  PLT 187 175 206   Basic Metabolic Panel: Recent Labs  Lab 07/30/24 1445 07/30/24 1839 07/30/24 2246 07/31/24 0359 08/01/24 0533  NA 136 138 137 138 135  K 3.7 3.5 3.5 3.6 3.8  CL 101 102 101 103 101  CO2 24 25 25 24 24   GLUCOSE 421* 178* 154* 150* 177*  BUN 13 11 11 10 10   CREATININE 0.73 0.61 0.66 0.70 0.70  CALCIUM  8.7* 9.2 9.2 9.0 9.2   Liver Function Tests: No results for input(s): AST, ALT, ALKPHOS, BILITOT, PROT, ALBUMIN in the last 168 hours. CBG: Recent Labs  Lab 07/31/24 1116 07/31/24 1916 07/31/24 1958 07/31/24 2337 08/01/24 0820  GLUCAP 266* 301* 320* 160* 219*    Scheduled Meds:  Chlorhexidine  Gluconate Cloth  6 each Topical Q2200   enoxaparin  (LOVENOX ) injection  55 mg Subcutaneous Q24H   lisinopril   20 mg Oral Daily   And   hydrochlorothiazide   25 mg Oral Daily   insulin  aspart  0-20 Units Subcutaneous TID WC   insulin  aspart  0-5 Units Subcutaneous QHS   insulin  aspart  4 Units Subcutaneous TID WC   insulin   glargine  22 Units Subcutaneous QHS   levETIRAcetam   500 mg Oral BID   metoprolol  succinate  100 mg Oral Daily   Continuous Infusions:  piperacillin -tazobactam (ZOSYN )  IV 3.375 g (08/01/24 0502)   PRN Meds:.ALPRAZolam , dextrose , HYDROmorphone  (DILAUDID ) injection, ondansetron  **OR** ondansetron  (ZOFRAN ) IV, mouth rinse, senna-docusate, traZODone   Family Communication: None at bedside  Disposition: Status is: Inpatient Remains inpatient appropriate because: Diabetic ulcer     Time spent: 39 minutes  Length of inpatient stay: 2 days  Author: Carliss LELON Canales, DO 08/01/2024 10:47 AM  For on call review [email or web address].   "

## 2024-08-01 NOTE — Telephone Encounter (Signed)
 Pharmacy Patient Advocate Encounter  Received notification from [employer] that Prior Authorization for FreeStyle Libre 3 Plus Sensor has been APPROVED from 08/01/24 to 01/28/25. Ran test claim, Copay is $0. This test claim was processed through [facility]- copay amounts may vary at other pharmacies due to pharmacy/plan contracts, or as the patient moves through the different stages of their insurance plan.   PA #/Case ID/Reference #: [id number]

## 2024-08-01 NOTE — Plan of Care (Signed)
" °  Problem: Education: Goal: Ability to describe self-care measures that may prevent or decrease complications (Diabetes Survival Skills Education) will improve Outcome: Progressing Goal: Individualized Educational Video(s) Outcome: Progressing   Problem: Fluid Volume: Goal: Ability to maintain a balanced intake and output will improve Outcome: Progressing   Problem: Metabolic: Goal: Ability to maintain appropriate glucose levels will improve Outcome: Progressing   Problem: Urinary Elimination: Goal: Ability to achieve and maintain adequate renal perfusion and functioning will improve Outcome: Progressing   "

## 2024-08-01 NOTE — Inpatient Diabetes Management (Signed)
 Inpatient Diabetes Program Recommendations  AACE/ADA: New Consensus Statement on Inpatient Glycemic Control (2015)  Target Ranges:  Prepandial:   less than 140 mg/dL      Peak postprandial:   less than 180 mg/dL (1-2 hours)      Critically ill patients:  140 - 180 mg/dL   Lab Results  Component Value Date   GLUCAP 219 (H) 08/01/2024   HGBA1C 11.5 (H) 07/30/2024    Review of Glycemic Control  Diabetes history: DM2 Outpatient Diabetes medications:  Metformin  1 gm bid Januvia daily Current orders for Inpatient glycemic control:  Lantus  22 units daily Novolog  0-20 units tid, 0-5 units hs correction  Inpatient Diabetes Program Recommendations:   Please consider: -Add Novolog  4 units tid meal coverage if eats 50% -Decrease Novolog  correction to 0-9 units tid, 0-5 units hs  Spoke with patient via phone (DM coordinator working remotely). Reviewed A1c of 11.5 (average blood glucose 283 over the past 2-3 months). Patient has been on insulin  in the past until approximately 6 years ago. Reviewed steps of insulin  pen with patient and patient verbalized correct steps. Patient states he had eaten prior to the am CBG so reminded patient to call for CBG prior to eating for accuracy. Patient's sister is a engineer, civil (consulting) and helped him when he was first diagnosed along with he attended nutrition classes. States he ate a lot of cookies after getting off late @ night but reviewed healthy ways to change what he is currently doing. Patient is interested in CGM and will plan to place one on discharge. Benefit check in progress.  Thank you, Alric Geise E. Gianlucca Szymborski, RN, MSN, CNS, CDCES  Diabetes Coordinator Inpatient Glycemic Control Team Team Pager [phone] (8am-5pm) 08/01/2024 10:17 AM

## 2024-08-01 NOTE — Progress Notes (Signed)
 Daily Progress Note   Subjective  - * No surgery found *  Follow-up right foot ulceration and infection.  No complaints had MRI of his right heel recently  Objective Vitals:   07/31/24 1400 07/31/24 1956 08/01/24 0406 08/01/24 0751  BP: 116/75 117/67 122/82 124/79  Pulse: 77 77 81 79  Resp: 16 18 18 18   Temp:  98.5 F (36.9 C) 98.7 F (37.1 C) 98.2 F (36.8 C)  TempSrc:    Oral  SpO2: 96% 97% 96% 97%  Weight:      Height:        Physical Exam: Large ulceration to the plantar aspect of the first MTPJ probes deep.  He has a large plantar heel ulceration as well.  MRI of the left forefoot is concerning for osteomyelitis of the fibular sesamoid with large ulceration.  MRI of the heel was negative for osteomyelitis or deep ulceration to the fascia.  Laboratory CBC    Component Value Date/Time   WBC 9.6 08/01/2024 0533   HGB 9.9 (L) 08/01/2024 0533   HGB 13.9 10/30/2011 1123   HCT 29.0 (L) 08/01/2024 0533   HCT 37.0 (L) 10/10/2011 1407   PLT 206 08/01/2024 0533   PLT 191 10/30/2011 1123    BMET    Component Value Date/Time   NA 135 08/01/2024 0533   NA 140 11/08/2014 0851   NA 136 09/20/2011 0727   K 3.8 08/01/2024 0533   K 4.0 10/10/2011 1407   CL 101 08/01/2024 0533   CL 103 09/20/2011 0727   CO2 24 08/01/2024 0533   CO2 21 09/20/2011 0727   GLUCOSE 177 (H) 08/01/2024 0533   GLUCOSE 153 (H) 09/20/2011 0727   BUN 10 08/01/2024 0533   BUN 13 11/08/2014 0851   BUN 11 09/20/2011 0727   CREATININE 0.70 08/01/2024 0533   CREATININE 0.75 10/10/2011 1407   CALCIUM  9.2 08/01/2024 0533   CALCIUM  9.2 09/20/2011 0727   GFRNONAA >60 08/01/2024 0533   GFRNONAA >60 10/10/2011 1407   GFRAA >60 12/12/2015 1540   GFRAA >60 10/10/2011 1407    Assessment/Planning: Diabetic neuropathic ulcer right foot Osteomyelitis Heel ulcer full-thickness  I discussed with the patient the need for debridement of this ulcerative site.  Will plan on debridement of both ulcerative  regions as well as removal of bone from the right great toe joint.  He has a sesamoid on MRI that is concerning for osteomyelitis.  We can send this off for culture as well.  He will need to be nonweightbearing to the right foot postsurgery.  I discussed the risk benefits alternatives and complications associated with surgery and consent has been given today.  Surgical orders will be placed.   Ashley Soulier A  08/01/2024, 12:06 PM

## 2024-08-01 NOTE — Telephone Encounter (Signed)
 Pharmacy Patient Advocate Encounter  Insurance verification completed.    The patient is insured through [facility].     Ran test claim for Lantus  100unit Pen and the current 30 day co-pay is $4.  Ran test claim for Humalog 100unit Pen and the current 30 day co-pay is $4.  This test claim was processed through [employer]- copay amounts may vary at other pharmacies due to [employer], or as the patient moves through the different stages of their insurance plan.

## 2024-08-02 ENCOUNTER — Encounter
Admission: EM | Disposition: A | Discharge status: Home / Self Care | Payer: Self-pay | Source: Home / Self Care | Attending: Internal Medicine

## 2024-08-02 ENCOUNTER — Inpatient Hospital Stay: Payer: Self-pay | Admitting: Anesthesiology

## 2024-08-02 ENCOUNTER — Inpatient Hospital Stay

## 2024-08-02 ENCOUNTER — Encounter: Payer: Self-pay | Admitting: Anesthesiology

## 2024-08-02 ENCOUNTER — Encounter: Payer: Self-pay | Admitting: Internal Medicine

## 2024-08-02 DIAGNOSIS — I739 Peripheral vascular disease, unspecified: Secondary | ICD-10-CM

## 2024-08-02 LAB — CBC
HCT: 33.6 % — ABNORMAL LOW (ref 39.0–52.0)
Hemoglobin: 11.4 g/dL — ABNORMAL LOW (ref 13.0–17.0)
MCH: 29.8 pg (ref 26.0–34.0)
MCHC: 33.9 g/dL (ref 30.0–36.0)
MCV: 87.7 fL (ref 80.0–100.0)
Platelets: 194 10*3/uL (ref 150–400)
RBC: 3.83 MIL/uL — ABNORMAL LOW (ref 4.22–5.81)
RDW: 13.1 % (ref 11.5–15.5)
WBC: 10.1 10*3/uL (ref 4.0–10.5)
nRBC: 0 % (ref 0.0–0.2)

## 2024-08-02 LAB — GLUCOSE, CAPILLARY
Glucose-Capillary: 184 mg/dL — ABNORMAL HIGH (ref 70–99)
Glucose-Capillary: 188 mg/dL — ABNORMAL HIGH (ref 70–99)
Glucose-Capillary: 187 mg/dL — ABNORMAL HIGH (ref 70–99)
Glucose-Capillary: 206 mg/dL — ABNORMAL HIGH (ref 70–99)
Glucose-Capillary: 308 mg/dL — ABNORMAL HIGH (ref 70–99)
Glucose-Capillary: 218 mg/dL — ABNORMAL HIGH (ref 70–99)
Glucose-Capillary: 202 mg/dL — ABNORMAL HIGH (ref 70–99)

## 2024-08-02 LAB — LIPID PANEL
Cholesterol: 123 mg/dL (ref 0–200)
HDL: 23 mg/dL — ABNORMAL LOW
LDL Cholesterol: 76 mg/dL (ref 0–99)
Total CHOL/HDL Ratio: 5.4 ratio
Triglycerides: 124 mg/dL
VLDL: 25 mg/dL (ref 0–40)

## 2024-08-02 LAB — BASIC METABOLIC PANEL WITH GFR
Anion gap: 12 (ref 5–15)
BUN: 11 mg/dL (ref 6–20)
CO2: 24 mmol/L (ref 22–32)
Calcium: 9.5 mg/dL (ref 8.9–10.3)
Chloride: 99 mmol/L (ref 98–111)
Creatinine, Ser: 0.69 mg/dL (ref 0.61–1.24)
GFR, Estimated: >60 mL/min
Glucose, Bld: 181 mg/dL — ABNORMAL HIGH (ref 70–99)
Potassium: 4 mmol/L (ref 3.5–5.1)
Sodium: 134 mmol/L — ABNORMAL LOW (ref 135–145)

## 2024-08-02 MED ORDER — OXYCODONE HCL 5 MG PO TABS
10.0000 mg | ORAL_TABLET | ORAL | Status: DC | PRN
  Administered 2024-08-02 – 2024-08-03 (×5): 10 mg via ORAL
  Filled 2024-08-02 (×5): qty 2

## 2024-08-02 MED ORDER — PHENYLEPHRINE 80 MCG/ML (10ML) SYRINGE FOR IV PUSH (FOR BLOOD PRESSURE SUPPORT)
PREFILLED_SYRINGE | INTRAVENOUS | Status: DC | PRN
  Administered 2024-08-02 (×4): 160 ug via INTRAVENOUS

## 2024-08-02 MED ORDER — LIDOCAINE-EPINEPHRINE 1 %-1:100000 IJ SOLN
INTRAMUSCULAR | Status: DC | PRN
  Administered 2024-08-02: 10 mL

## 2024-08-02 MED ORDER — FENTANYL CITRATE (PF) 100 MCG/2ML IJ SOLN: 25.0000 ug | INTRAMUSCULAR | Status: DC | PRN

## 2024-08-02 MED ORDER — PHENYLEPHRINE 80 MCG/ML (10ML) SYRINGE FOR IV PUSH (FOR BLOOD PRESSURE SUPPORT)
PREFILLED_SYRINGE | INTRAVENOUS | Status: AC
  Filled 2024-08-02: qty 10

## 2024-08-02 MED ORDER — OXYCODONE HCL 5 MG/5ML PO SOLN: 5.0000 mg | Freq: Once | ORAL | Status: DC | PRN

## 2024-08-02 MED ORDER — BUPIVACAINE HCL (PF) 0.5 % IJ SOLN
INTRAMUSCULAR | Status: AC
  Filled 2024-08-02: qty 30

## 2024-08-02 MED ORDER — LACTATED RINGERS IV SOLN: INTRAVENOUS | Status: DC | PRN

## 2024-08-02 MED ORDER — PROPOFOL 10 MG/ML IV BOLUS
INTRAVENOUS | Status: DC | PRN
  Administered 2024-08-02: 250 mg via INTRAVENOUS
  Administered 2024-08-02: 25 ug/kg/min via INTRAVENOUS

## 2024-08-02 MED ORDER — OXYCODONE HCL 5 MG PO TABS: 5.0000 mg | ORAL_TABLET | Freq: Once | ORAL | Status: DC | PRN

## 2024-08-02 MED ORDER — DEXMEDETOMIDINE HCL IN NACL 80 MCG/20ML IV SOLN
INTRAVENOUS | Status: DC | PRN
  Administered 2024-08-02: 4 ug via INTRAVENOUS

## 2024-08-02 MED ORDER — MIDAZOLAM HCL (PF) 2 MG/2ML IJ SOLN
INTRAMUSCULAR | Status: DC | PRN
  Administered 2024-08-02: 2 mg via INTRAVENOUS

## 2024-08-02 MED ORDER — LIDOCAINE HCL (CARDIAC) PF 100 MG/5ML IV SOSY
PREFILLED_SYRINGE | INTRAVENOUS | Status: DC | PRN
  Administered 2024-08-02: 100 mg via INTRAVENOUS

## 2024-08-02 MED ORDER — MIDAZOLAM HCL 2 MG/2ML IJ SOLN
INTRAMUSCULAR | Status: AC
  Filled 2024-08-02: qty 2

## 2024-08-02 MED ORDER — PROPOFOL 1000 MG/100ML IV EMUL
INTRAVENOUS | Status: AC
  Filled 2024-08-02: qty 100

## 2024-08-02 MED ORDER — LIDOCAINE-EPINEPHRINE 1 %-1:100000 IJ SOLN
INTRAMUSCULAR | Status: AC
  Filled 2024-08-02: qty 20

## 2024-08-02 MED ORDER — ACETAMINOPHEN 10 MG/ML IV SOLN: 1000.0000 mg | Freq: Once | INTRAVENOUS | Status: DC | PRN

## 2024-08-02 MED ORDER — BUPIVACAINE HCL 0.5 % IJ SOLN
INTRAMUSCULAR | Status: DC | PRN
  Administered 2024-08-02: 10 mL

## 2024-08-02 MED ORDER — LIDOCAINE HCL (PF) 2 % IJ SOLN
INTRAMUSCULAR | Status: AC
  Filled 2024-08-02: qty 5

## 2024-08-02 MED ORDER — DEXAMETHASONE SOD PHOSPHATE PF 10 MG/ML IJ SOLN
INTRAMUSCULAR | Status: DC | PRN
  Administered 2024-08-02: 4 mg via INTRAVENOUS

## 2024-08-02 MED ORDER — OXIDIZED CELLULOSE EX PADS
MEDICATED_PAD | CUTANEOUS | Status: DC | PRN
  Administered 2024-08-02: 1 via TOPICAL

## 2024-08-02 MED ORDER — DROPERIDOL 2.5 MG/ML IJ SOLN: 0.6250 mg | Freq: Once | INTRAMUSCULAR | Status: DC | PRN

## 2024-08-02 MED ORDER — OXYCODONE HCL 5 MG PO TABS
5.0000 mg | ORAL_TABLET | ORAL | Status: DC | PRN
  Administered 2024-08-02: 5 mg via ORAL
  Filled 2024-08-02: qty 1

## 2024-08-02 MED ORDER — FENTANYL CITRATE (PF) 100 MCG/2ML IJ SOLN
INTRAMUSCULAR | Status: AC
  Filled 2024-08-02: qty 2

## 2024-08-02 MED ORDER — FENTANYL CITRATE (PF) 100 MCG/2ML IJ SOLN
INTRAMUSCULAR | Status: DC | PRN
  Administered 2024-08-02: 50 ug via INTRAVENOUS

## 2024-08-02 MED ORDER — 0.9 % SODIUM CHLORIDE (POUR BTL) OPTIME
TOPICAL | Status: DC | PRN
  Administered 2024-08-02: 500 mL

## 2024-08-02 NOTE — Progress Notes (Signed)
 " Progress Note   Patient: Daniel Ritter FMW:[id number] DOB: December 11, 1975 DOA: 07/30/2024  DOS: the patient was seen and examined on 08/02/2024   Brief hospital course:  49 y.o. male with medical history significant of IIDM, chronic right foot diabetic ulcer, PVD status post angioplasty and right popliteal stenting in September 2025, HTN, HLD, peripheral neuropathy, presented with worsening of right foot infection.    Assessment and Plan:   Right foot diabetic ulcers with concern for osteomyelitis - Evaluated by podiatry.  MRI of the foot showing concern for forefoot ulcer penetrating with sesamoid osteomyelitis.  Ulcer on heel noted to have no abscess or concern for osteomyelitis.  Currently on empiric Zosyn .  Scheduled to undergo further debridement later this morning.   Early diabetic ketoacidosis - Likely exacerbated by underlying infection.  On presentation, glucose greater than 500, mild acidemia, elevated beta hydroxybutyrate.  Was initiated on IV insulin  drip, DKA protocol.  Showed marked improvement in acidemia and glucose values.  Subsequently transition to subcu insulin .  DKA resolved.   Uncontrolled insulin  dependent diabetes mellitus with hyperglycemia - A1c 11.5 suggesting very poor control.  Currently on insulin  glargine 22 units nightly with insulin  sliding scale.  Added 4 units aspart with meal coverage.  Will continue to monitor glucose closely.  Will likely need new regimen upon discharge.   Hypertension/hyperlipidemia - Resume patient's home medication regimen.   Peripheral vascular disease - S/p recent angioplasty and right popliteal stenting.  Continue aspirin  but hold Plavix  for possible surgical intervention.   Subjective: Patient resting comfortably this morning.  Showered and cleaned for pending procedure/debridement later this morning.  Denies any fever, chills, chest pain, nausea, vomiting, abdominal pain  Physical Exam:  Vitals:   08/02/24 0916 08/02/24 1055  08/02/24 1115 08/02/24 1154  BP: 135/82 111/69 108/74 109/80  Pulse: 81 87 83 79  Resp: 16 11 18 17   Temp: 98.7 F (37.1 C) (!) 97 F (36.1 C)  98.7 F (37.1 C)  TempSrc: Temporal   Oral  SpO2: 95% 93% 100% 95%  Weight: 112.9 kg     Height: 6' 1 (1.854 m)       GENERAL:  Alert, pleasant, no acute distress  HEENT:  EOMI CARDIOVASCULAR:  RRR, no murmurs appreciated RESPIRATORY:  Clear to auscultation, no wheezing, rales, or rhonchi GASTROINTESTINAL:  Soft, nontender, nondistended EXTREMITIES:  No LE edema bilaterally NEURO:  No new focal deficits appreciated SKIN: Right foot with fresh bandage PSYCH:  Appropriate mood and affect    Data Reviewed:  Imaging Studies: DG MINI C-ARM IMAGE ONLY Result Date: 08/02/2024 There is no interpretation for this exam.  This order is for images obtained during a surgical procedure.  Please See Surgeries Tab for more information regarding the procedure.   MR ANKLE RIGHT WO CONTRAST Result Date: 07/31/2024 EXAM: MRI of the right Ankle without contrast. 07/31/2024 08:49:14 AM TECHNIQUE: Multiplanar multisequence MRI of the right ankle was performed without the administration of intravenous contrast. COMPARISON: Forefoot MRI from 07/30/2024 and ankle radiographs from 02/03/2024. CLINICAL HISTORY: Ulcer right heel. FINDINGS: SYNDESMOTIC LIGAMENTS: Intact anterior inferior tibiofibular ligament and posterior inferior tibiofibular ligament. No significant periligamentous edema or interval widening. LATERAL COLLATERAL LIGAMENT COMPLEX: Intact anterior talofibular ligament, posterior talofibular ligament and calcaneofibular ligament. DELTOID LIGAMENT COMPLEX: Intact superficial and deep components. SINUS TARSI AND SPRING LIGAMENT: Thickened superomedial portion of the spring ligament with a fluid filled larger than normal gap between the medioplantar oblique and inferoplantar longitudinal portions of the spring ligament possibly reflecting  the chronically  fragmented navicular, suggesting some degree of tearing of the medioplantar oblique portion. Normal appearance of the sinus tarsi. MEDIAL TENDONS: Distal tibialis posterior tenosynovitis. Intact flexor digitorum and flexor hallucis longus tendons. LATERAL TENDONS: Mild common peroneal tendon sheath tenosynovitis with tendinopathy and potentially mild longitudinal split tearing of the peroneus brevis tendon just below the lateral malleolus. Intact peroneus longus tendon. ANTERIOR TENDONS: The tibialis anterior, extensor hallucis longus and extensor digitorum longus tendons are normal in position, morphology and signal. ACHILLES TENDON: The Achilles tendon is normal in position, morphology and signal. No associated bursitis. PLANTAR FASCIA: Plantar heel ulceration extending up towards the plantar fascia as on image 13 series 8, with associated edema in the subcutaneous tissues and extending to the superficial margin of the plantar fascia. No plantar fascia tear or abscess identified. The medial and lateral bundles of the plantar fascia are normal in morphology and signal. No plantar fascial nodules. Mild dorsal subcutaneous edema along the ankle. TARSAL TUNNEL: There are no obstructing lesions within the tarsal tunnel. 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 There is mostly fluid within the gap. Type 1 accessory navicular. No findings of active osteomyelitis. JOINT SPACES: No significant joint effusion. Low level arthropathy between the navicular fragments and the adjacent cuneiforms. Fissure dorsal talonavicular ligament in the vicinity of the gap/fracture, with dorsal talar spurring. Normal alignment. IMPRESSION: 1. Plantar heel ulceration extending toward the plantar fascia with adjacent subcutaneous edema, without abscess, plantar fascia tear,  or active osteomyelitis. 2. Chronic nonunited sagittally oriented navicular fracture with 1.1 cm fragment separation and fluid within the gap. There is also a smaller inferior navicular fragment. 3. Low-grade arthropathy between the navicular fragments and adjacent cuneiforms, with fissuring of the dorsal talonavicular ligament and dorsal talar spurring. 4. Thickening of the superomedial spring ligament with suspected tearing of the medioplantar oblique component. 5. Mild common peroneal tendon sheath tenosynovitis with tendinopathy and possible mild longitudinal split tearing of the peroneus brevis tendon just below the lateral malleolus. 6. Distal tibialis posterior tenosynovitis with type 1 accessory navicular. Electronically signed by: Ryan Salvage MD 07/31/2024 10:14 AM EST RP Workstation: [id number]   US  Venous Img Lower Unilateral Right (DVT) Result Date: 07/30/2024 EXAM: ULTRASOUND DUPLEX OF THE RIGHT LOWER EXTREMITY VEINS TECHNIQUE: Duplex ultrasound using B-mode/gray scaled imaging and Doppler spectral analysis and color flow was obtained of the deep venous structures of the right lower extremity. COMPARISON: Comparison with 01/13/2023. CLINICAL HISTORY: Swelling. ICD10: [id number] Injury of blood vessels of thorax without mention of open wound into cavity. FINDINGS: The common femoral vein, femoral vein, popliteal vein, and posterior tibial vein demonstrate normal compressibility with normal color flow and spectral analysis. IMPRESSION: 1. No evidence of deep venous thrombosis in the examined lower extremity. Electronically signed by: Elsie Gravely MD 07/30/2024 03:51 PM EST RP Workstation: [id number]   MR FOOT RIGHT W WO CONTRAST Result Date: 07/30/2024 EXAM: MRI of the Right Foot with and without contrast. 07/30/2024 03:15:52 PM TECHNIQUE: Multiplanar multisequence MRI of the right forefoot was performed with and without the administration of intravenous contrast. Contrast administered was 10 mL  Gadobutrol  (GADAVIST ) 1 MMOL/ML injection. COMPARISON: Radiographs 07/30/2024. CLINICAL HISTORY: R foot infection, concern for osteomyelitis. Right foot infection, concern for osteomyelitis. 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 Visualized Lisfranc ligament is intact. No significant  Lisfranc interval widening or significant periligamentous edema. BONE MARROW: Low level edema signal and enhancement in the lateral sesamoid of the 1st digit as on image 22 series 10 suspicious for sesamoiditis or osteomyelitis. No other suspicious findings for osteomyelitis. No acute fracture or aggressive marrow replacing lesion. GREATER AND LESSER MTP JOINTS: Underlying the 1st metatarsophalangeal joint there is a cutaneous ulceration on image 22 series 4 with abnormal subcutaneous signal extending up towards the lateral sesamoid. No drainable abscess or joint effusion. No significant osseous erosions. No significant degenerative changes. Normal alignment. SOFT TISSUES: Interval change deepening ulcer of the medial right foot. Dorsal subcutaneous edema in the foot tracks and to the toes, cellulitis not excluded. Abnormal subcutaneous signal extending up towards the lateral sesamoid. No drainable abscess. TENDONS: Visualized flexor and extensor tendons are intact without tenosynovitis. IMPRESSION: 1. Low level edema signal and enhancement in the lateral sesamoid of the 1st digit, suspicious for sesamoiditis or osteomyelitis. 2. Deepening cutaneous ulceration underlying the 1st metatarsophalangeal joint with abnormal subcutaneous signal extending toward the lateral sesamoid, without drainable abscess or joint effusion. 3. Dorsal subcutaneous edema tracking to the toes, cellulitis not excluded. 4. 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 Electronically signed by: Ryan Salvage MD 07/30/2024 03:31 PM EST RP Workstation: [id number]   DG Foot Complete Right Result  Date: 07/30/2024 EXAM: 3 OR MORE VIEW(S) XRAY OF THE RIGHT FOOT 07/30/2024 12:45:07 PM COMPARISON: 02/03/2024 CLINICAL HISTORY: Infection, pain, swelling. 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 Navicular fracture of uncertain chronicity. Osteomyelitis is not excluded. Mild degenerative changes at the 1st metatarsophalangeal joint. No malalignment. SOFT TISSUES: Soft tissue ulceration along the plantar surface of the forefoot and hindfoot. Diffuse forefoot and soft tissue swelling. Soft tissue swelling about the plantar aspect of the hind foot. Diffuse vascular calcifications. IMPRESSION: 1. Navicular fracture of uncertain chronicity. Osteomyelitis is difficult to exclude. 2. Soft tissue ulceration along the plantar surface of the forefoot and hindfoot with diffuse soft tissue swelling. Electronically signed by: norman gatlin MD 07/30/2024 01:19 PM EST RP Workstation: [id number]    There are no new results to review at this time.  Previous records (including but not limited to H&P, progress notes, nursing notes, TOC management) were reviewed in assessment of this patient.  Labs: CBC: Recent Labs  Lab 07/30/24 1129 07/31/24 0359 08/01/24 0533 08/02/24 0507  WBC 9.0 9.1 9.6 10.1  HGB 12.6* 10.5* 9.9* 11.4*  HCT 36.0* 29.4* 29.0* 33.6*  MCV 84.5 84.7 87.1 87.7  PLT 187 175 206 194   Basic Metabolic Panel: Recent Labs  Lab 07/30/24 1839 07/30/24 2246 07/31/24 0359 08/01/24 0533 08/02/24 0507  NA 138 137 138 135 134*  K 3.5 3.5 3.6 3.8 4.0  CL 102 101 103 101 99  CO2 25 25 24 24 24   GLUCOSE 178* 154* 150* 177* 181*  BUN 11 11 10 10 11   CREATININE 0.61 0.66 0.70 0.70 0.69  CALCIUM  9.2 9.2 9.0 9.2 9.5   Liver Function Tests: No results for input(s): AST, ALT, ALKPHOS, BILITOT, PROT, ALBUMIN in the last 168 hours. CBG: Recent Labs  Lab 08/01/24 2312 08/02/24 0403 08/02/24 0757 08/02/24 1058 08/02/24 1150  GLUCAP 150* 184* 188* 187* 206*     Scheduled Meds:  enoxaparin  (LOVENOX ) injection  55 mg Subcutaneous Q24H   lisinopril   20 mg Oral Daily   And   hydrochlorothiazide   25 mg Oral Daily   insulin  aspart  0-20 Units Subcutaneous TID WC  insulin  aspart  0-5 Units Subcutaneous QHS   insulin  aspart  4 Units Subcutaneous TID WC   insulin  glargine  22 Units Subcutaneous QHS   levETIRAcetam   500 mg Oral BID   metoprolol  succinate  100 mg Oral Daily   Continuous Infusions:  piperacillin -tazobactam (ZOSYN )  IV 3.375 g (08/02/24 0556)   PRN Meds:.ALPRAZolam , dextrose , HYDROmorphone  (DILAUDID ) injection, ondansetron  **OR** ondansetron  (ZOFRAN ) IV, mouth rinse, oxyCODONE , senna-docusate, traZODone   Family Communication: None at bedside  Disposition: Status is: Inpatient Remains inpatient appropriate because: Debridement     Time spent: 36 minutes  Length of inpatient stay: 3 days  Author: Carliss LELON Canales, DO 08/02/2024 11:58 AM  For on call review [email or web address].   "

## 2024-08-02 NOTE — Evaluation (Signed)
 Physical Therapy Evaluation Patient Details Name: Daniel Ritter MRN: [id number] DOB: [birth year] Today's Date: 08/02/2024  History of Present Illness  Pt is a 49 yo male s/p I&D of R foot. PMH of  DM, R foot diabetic ulcer, PVD s/p angioplasty, R popliteal stenting, HTN, HLD.  Clinical Impression  Pt alert, agreeable to PT, denied pain. At baseline he lives alone, independent. Has family that lives nearby that will check in as able. Supine to sit modI, sit <> stand from EOB and from recliner, CGA with RW. Pt with good awareness of NWB status, and sit <> Stand from recliner of better quality than from EOB. Able to hop several feet in the room without LOB as well.  Overall the patient demonstrated deficits (see PT Problem List) that impede the patient's functional abilities, safety, and mobility and would benefit from skilled PT intervention.          If plan is discharge home, recommend the following: A little help with bathing/dressing/bathroom;Assistance with cooking/housework;Assist for transportation;Help with stairs or ramp for entrance   Can travel by private vehicle        Equipment Recommendations Rolling walker (2 wheels)  Recommendations for Other Services       Functional Status Assessment Patient has had a recent decline in their functional status and demonstrates the ability to make significant improvements in function in a reasonable and predictable amount of time.     Precautions / Restrictions Precautions Precautions: Fall Recall of Precautions/Restrictions: Intact Restrictions Weight Bearing Restrictions Per Provider Order: Yes RLE Weight Bearing Per Provider Order: Non weight bearing      Mobility  Bed Mobility Overal bed mobility: Needs Assistance Bed Mobility: Supine to Sit     Supine to sit: Modified independent (Device/Increase time)          Transfers Overall transfer level: Needs assistance Equipment used: Rolling walker (2  wheels) Transfers: Sit to/from Stand Sit to Stand: Contact guard assist                Ambulation/Gait Ambulation/Gait assistance: Contact guard assist Gait Distance (Feet): 2 Feet Assistive device: Rolling walker (2 wheels)         General Gait Details: pt able to hop, no LOB  Stairs            Wheelchair Mobility     Tilt Bed    Modified Rankin (Stroke Patients Only)       Balance Overall balance assessment: Needs assistance Sitting-balance support: Feet supported Sitting balance-Leahy Scale: Good     Standing balance support: Bilateral upper extremity supported Standing balance-Leahy Scale: Poor                               Pertinent Vitals/Pain Pain Assessment Pain Assessment: No/denies pain    Home Living Family/patient expects to be discharged to:: Private residence Living Arrangements: Alone Available Help at Discharge: Family Type of Home: House Home Access: Ramped entrance       Home Layout: One level Home Equipment: Rollator (4 wheels);BSC/3in1;Shower seat;Crutches;Grab bars - tub/shower;Grab bars - toilet;Wheelchair - manual      Prior Function Prior Level of Function : Independent/Modified Independent                     Extremity/Trunk Assessment   Upper Extremity Assessment Upper Extremity Assessment: Overall WFL for tasks assessed    Lower Extremity Assessment Lower Extremity Assessment: Overall [facility]  for tasks assessed (able to SLR bilaterally)       Communication        Cognition Arousal: Alert Behavior During Therapy: WFL for tasks assessed/performed   PT - Cognitive impairments: No apparent impairments                                 Cueing       General Comments      Exercises     Assessment/Plan    PT Assessment Patient needs continued PT services  PT Problem List Decreased strength;Decreased range of motion;Decreased activity tolerance;Decreased balance;Decreased  mobility       PT Treatment Interventions DME instruction;Balance training;Neuromuscular re-education;Gait training;Stair training;Functional mobility training;Patient/family education;Therapeutic activities;Therapeutic exercise    PT Goals (Current goals can be found in the Care Plan section)  Acute Rehab PT Goals Patient Stated Goal: to go home PT Goal Formulation: With patient Time For Goal Achievement: 08/16/24 Potential to Achieve Goals: Good    Frequency Min 2X/week     Co-evaluation               AM-PAC PT 6 Clicks Mobility  Outcome Measure Help needed turning from your back to your side while in a flat bed without using bedrails?: A Little Help needed moving from lying on your back to sitting on the side of a flat bed without using bedrails?: A Little Help needed moving to and from a bed to a chair (including a wheelchair)?: A Little Help needed standing up from a chair using your arms (e.g., wheelchair or bedside chair)?: A Little Help needed to walk in hospital room?: A Little Help needed climbing 3-5 steps with a railing? : A Little 6 Click Score: 18    End of Session   Activity Tolerance: Patient tolerated treatment well Patient left: in chair;with call bell/phone within reach;with chair alarm set;with family/visitor present Nurse Communication: Mobility status PT Visit Diagnosis: Other abnormalities of gait and mobility (R26.89);Difficulty in walking, not elsewhere classified (R26.2);Muscle weakness (generalized) (M62.81)    Time: 8586-8568 PT Time Calculation (min) (ACUTE ONLY): 18 min   Charges:   PT Evaluation $PT Eval Low Complexity: 1 Low PT Treatments $Therapeutic Activity: 8-22 mins PT General Charges $$ ACUTE PT VISIT: 1 Visit         Doyal Shams PT, DPT 4:17 PM,08/02/24

## 2024-08-02 NOTE — Inpatient Diabetes Management (Signed)
 Inpatient Diabetes Program Recommendations  AACE/ADA: New Consensus Statement on Inpatient Glycemic Control (2015)  Target Ranges:  Prepandial:   less than 140 mg/dL      Peak postprandial:   less than 180 mg/dL (1-2 hours)      Critically ill patients:  140 - 180 mg/dL   Lab Results  Component Value Date   GLUCAP 188 (H) 08/02/2024   HGBA1C 11.5 (H) 07/30/2024    Review of Glycemic Control  Diabetes history: DM2 Outpatient Diabetes medications: metformin  1000 BID, Januvia Current orders for Inpatient glycemic control: Lantus  22 daily, Novolog  0-20 TID and 0-5 HS  Inpatient Diabetes Program Recommendations:     Spoke with pt's sister Eleanor, on phone regarding pt's diabetes care. She wanted to make sure he felt good about being discharged home on insulin  pens. Also interested in having CGM placed before discharge. Eleanor is an [employer] and happy to help pt any way she can. Assured her that our team would see pt prior to discharge tomorrow.   Thank you. Shona Brandy, RD, LDN, CDCES Inpatient Diabetes Coordinator [phone]

## 2024-08-02 NOTE — Anesthesia Procedure Notes (Signed)
 Procedure Name: LMA Insertion Date/Time: 08/02/2024 10:00 AM  Performed by: Gillermo Spruce I, CRNAPre-anesthesia Checklist: Patient identified, Patient being monitored, Timeout performed, Emergency Drugs available and Suction available Patient Re-evaluated:Patient Re-evaluated prior to induction Oxygen Delivery Method: Circle system utilized Preoxygenation: Pre-oxygenation with 100% oxygen Induction Type: IV induction Ventilation: Mask ventilation without difficulty LMA: LMA inserted LMA Size: 5.0 Tube type: Oral Number of attempts: 1 Placement Confirmation: positive ETCO2 and breath sounds checked- equal and bilateral Tube secured with: Tape Dental Injury: Teeth and Oropharynx as per pre-operative assessment

## 2024-08-02 NOTE — Anesthesia Preprocedure Evaluation (Addendum)
"                                    Anesthesia Evaluation  Patient identified by MRN, date of birth, ID band Patient awake    Reviewed: Allergy & Precautions, H&P , NPO status , Patient's Chart, lab work & pertinent test results, reviewed documented beta blocker date and time   Airway Mallampati: II  TM Distance: >3 FB Neck ROM: full    Dental  (+) Teeth Intact   Pulmonary former smoker   Pulmonary exam normal        Cardiovascular Exercise Tolerance: Good hypertension, On Medications + Peripheral Vascular Disease  Normal cardiovascular exam Rate:Normal     Neuro/Psych Seizures -,  PSYCHIATRIC DISORDERS Anxiety Depression     Neuromuscular disease CVA    GI/Hepatic Neg liver ROS,GERD  Medicated,,  Endo/Other  diabetes, Well Controlled    Renal/GU negative Renal ROS  negative genitourinary   Musculoskeletal   Abdominal   Peds  Hematology negative hematology ROS (+)   Anesthesia Other Findings   Reproductive/Obstetrics negative OB ROS                              Anesthesia Physical Anesthesia Plan  ASA: 3  Anesthesia Plan: General LMA   Post-op Pain Management:    Induction:   PONV Risk Score and Plan: 3  Airway Management Planned:   Additional Equipment:   Intra-op Plan:   Post-operative Plan:   Informed Consent: I have reviewed the patients History and Physical, chart, labs and discussed the procedure including the risks, benefits and alternatives for the proposed anesthesia with the patient or authorized representative who has indicated his/her understanding and acceptance.       Plan Discussed with: CRNA  Anesthesia Plan Comments:          Anesthesia Quick Evaluation  "

## 2024-08-02 NOTE — Op Note (Signed)
 Operative note   Surgeon:Celita Aron [employer]/support/administrative Officer: None    Preop diagnosis: 1.  Osteomyelitis fibular sesamoid right foot 2.  Full-thickness ulcer right plantar first MTPJ 3.  Full-thickness ulcer right plantar heel    Postop diagnosis: Same    Procedure: 1.  Excision of fibular sesamoid plantar right foot 2.  Full-thickness ulcer debridement right first MTPJ to capsule and tendon 3.  Full-thickness excisional debridement ulcer to subcutaneous tissue plantar right heel 4.  Intraoperative fluoroscopy use without assistance of radiologist    EBL: Minimal    Anesthesia: General With local.  Local consist of a total of 20 cc of a one-to-one mixture of 0.5% plain bupivacaine  and 1% lidocaine  with epinephrine     Hemostasis: Lidocaine  with epinephrine     Specimen: Deep wound culture right first MTPJ, culture of the fibular sesamoid right foot, bone for pathology fibular sesamoid right foot    Complications: None    Operative indications:Daniel Ritter is an 49 y.o. that presents today for surgical intervention.  The risks/benefits/alternatives/complications have been discussed and consent has been given.    Procedure:  Patient was brought into the OR and placed on the operating table in thesupine position. After anesthesia was obtained theright lower extremity was prepped and draped in usual sterile fashion.  Attention was initially directed to the plantar right heel where a full-thickness ulceration was noted.  This measured approximately 1 cm in diameter with a depth of 1 cm.  Excisional debridement was performed with a Versajet.  Soft tissue down to including the subcutaneous tissue was removed.  Excision of nonviable fibrotic and subcutaneous tissue was performed.  Pre and post measurements were 1 cm x 1 cm x 1 cm.  Attention was then directed to the plantar medial right first MTPJ where full-thickness ulceration was noted under the plantar medial right joint region.  Full-thickness  excisional debridement of this 3 cm ulceration was performed down to the level of the fascia and tendon.  Excisional debridement of nonviable fibrotic tissue was performed.  Pre and postdebridement measurements were 3 cm in diameter with a depth of 1 cm.  At this time an incision was made overlying the fibular region as the ulceration was taken proximal and distal.  At this time the flexor hallucis longus was noted and retracted medially.  The fibular sesamoid was noted and excisionally removed sharply with a 15 blade.  A small sample of the bone was sent for culture.  The remainder of the bone was sent for pathological examination.  The wound was then flushed with copious amounts of irrigation.  The proximal distal aspect of the incision site was then closed with 3-0 Vicryl.  Large bulky sterile dressings were applied to all areas overlying Surgicel fibrillar packing.  Patient will remain nonweightbearing to the right foot.    Patient tolerated the procedure and anesthesia well.  Was transported from the OR to the PACU with all vital signs stable and vascular status intact. To be discharged per routine protocol.  Will follow up in approximately 1 week in the outpatient clinic.

## 2024-08-02 NOTE — H&P (Signed)
 HISTORY AND PHYSICAL INTERVAL NOTE:  08/02/2024  9:28 AM  Daniel Ritter  has presented today for surgery, with the diagnosis of Ulcer.  The various methods of treatment have been discussed with the patient.  No guarantees were given.  After consideration of risks, benefits and other options for treatment, the patient has consented to surgery.  I have reviewed the patients chart and labs.    The patient was reexamined.  There have been no changes to this history and physical examination.  Ashley Soulier A

## 2024-08-02 NOTE — Transfer of Care (Signed)
 Immediate Anesthesia Transfer of Care Note  Patient: Daniel Ritter  Procedure(s) Performed: IRRIGATION AND DEBRIDEMENT FOOT (Right: Foot)  Patient Location: PACU  Anesthesia Type:General  Level of Consciousness: awake, alert , and oriented  Airway & Oxygen Therapy: Patient Spontanous Breathing and Patient connected to face mask oxygen  Post-op Assessment: Report given to RN and Post -op Vital signs reviewed and stable  Post vital signs: stable  Last Vitals:  Vitals Value Taken Time  BP 111/69 08/02/24 10:55  Temp    Pulse 80 08/02/24 10:57  Resp 15 08/02/24 10:57  SpO2 99 % 08/02/24 10:57  Vitals shown include unfiled device data.  Last Pain:  Vitals:   08/02/24 0916  TempSrc: Temporal  PainSc: 5       Patients Stated Pain Goal: 0 (07/31/24 1200)  Complications: No notable events documented.

## 2024-08-02 NOTE — Plan of Care (Signed)
" °  Problem: Coping: Goal: Ability to adjust to condition or change in health will improve Outcome: Progressing   Problem: Health Behavior/Discharge Planning: Goal: Ability to identify and utilize available resources and services will improve Outcome: Progressing   Problem: Metabolic: Goal: Ability to maintain appropriate glucose levels will improve Outcome: Progressing   Problem: Skin Integrity: Goal: Risk for impaired skin integrity will decrease Outcome: Progressing   Problem: Cardiac: Goal: Ability to maintain an adequate cardiac output will improve Outcome: Progressing   Problem: Nutritional: Goal: Maintenance of adequate nutrition will improve Outcome: Progressing   "

## 2024-08-03 ENCOUNTER — Encounter: Payer: Self-pay | Admitting: Podiatry

## 2024-08-03 DIAGNOSIS — E11621 Type 2 diabetes mellitus with foot ulcer: Secondary | ICD-10-CM

## 2024-08-03 DIAGNOSIS — L97519 Non-pressure chronic ulcer of other part of right foot with unspecified severity: Secondary | ICD-10-CM

## 2024-08-03 LAB — BASIC METABOLIC PANEL WITH GFR
Anion gap: 11 (ref 5–15)
BUN: 15 mg/dL (ref 6–20)
CO2: 25 mmol/L (ref 22–32)
Calcium: 9.1 mg/dL (ref 8.9–10.3)
Chloride: 97 mmol/L — ABNORMAL LOW (ref 98–111)
Creatinine, Ser: 0.79 mg/dL (ref 0.61–1.24)
GFR, Estimated: >60 mL/min
Glucose, Bld: 239 mg/dL — ABNORMAL HIGH (ref 70–99)
Potassium: 4.1 mmol/L (ref 3.5–5.1)
Sodium: 133 mmol/L — ABNORMAL LOW (ref 135–145)

## 2024-08-03 LAB — GLUCOSE, CAPILLARY
Glucose-Capillary: 251 mg/dL — ABNORMAL HIGH (ref 70–99)
Glucose-Capillary: 219 mg/dL — ABNORMAL HIGH (ref 70–99)
Glucose-Capillary: 249 mg/dL — ABNORMAL HIGH (ref 70–99)
Glucose-Capillary: 265 mg/dL — ABNORMAL HIGH (ref 70–99)
Glucose-Capillary: 231 mg/dL — ABNORMAL HIGH (ref 70–99)

## 2024-08-03 LAB — MAGNESIUM: Magnesium: 1.6 mg/dL — ABNORMAL LOW (ref 1.7–2.4)

## 2024-08-03 LAB — CBC
HCT: 33.5 % — ABNORMAL LOW (ref 39.0–52.0)
Hemoglobin: 11.1 g/dL — ABNORMAL LOW (ref 13.0–17.0)
MCH: 28.8 pg (ref 26.0–34.0)
MCHC: 33.1 g/dL (ref 30.0–36.0)
MCV: 86.8 fL (ref 80.0–100.0)
Platelets: 209 10*3/uL (ref 150–400)
RBC: 3.86 MIL/uL — ABNORMAL LOW (ref 4.22–5.81)
RDW: 13.1 % (ref 11.5–15.5)
WBC: 10.9 10*3/uL — ABNORMAL HIGH (ref 4.0–10.5)
nRBC: 0 % (ref 0.0–0.2)

## 2024-08-03 LAB — SURGICAL PATHOLOGY

## 2024-08-03 MED ORDER — INSULIN GLARGINE 100 UNIT/ML ~~LOC~~ SOLN
26.0000 [IU] | Freq: Every day | SUBCUTANEOUS | Status: DC
  Administered 2024-08-03: 26 [IU] via SUBCUTANEOUS
  Filled 2024-08-03 (×2): qty 0.26

## 2024-08-03 MED ORDER — MAGNESIUM SULFATE 2 GM/50ML IV SOLN
2.0000 g | Freq: Once | INTRAVENOUS | Status: AC
  Administered 2024-08-03: 2 g via INTRAVENOUS
  Filled 2024-08-03: qty 50

## 2024-08-03 MED ORDER — ROSUVASTATIN CALCIUM 10 MG PO TABS
20.0000 mg | ORAL_TABLET | Freq: Every day | ORAL | Status: DC
  Administered 2024-08-03 – 2024-08-04 (×2): 20 mg via ORAL
  Filled 2024-08-03 (×2): qty 2

## 2024-08-03 MED ORDER — CLOPIDOGREL BISULFATE 75 MG PO TABS
75.0000 mg | ORAL_TABLET | Freq: Every day | ORAL | Status: DC
  Administered 2024-08-03 – 2024-08-04 (×2): 75 mg via ORAL
  Filled 2024-08-03 (×2): qty 1

## 2024-08-03 MED ORDER — ASPIRIN 81 MG PO TBEC
81.0000 mg | DELAYED_RELEASE_TABLET | Freq: Every day | ORAL | Status: DC
  Administered 2024-08-03 – 2024-08-04 (×2): 81 mg via ORAL
  Filled 2024-08-03 (×2): qty 1

## 2024-08-03 MED ORDER — INSULIN GLARGINE 100 UNIT/ML ~~LOC~~ SOLN: 30.0000 [IU] | Freq: Every day | SUBCUTANEOUS | Status: DC

## 2024-08-03 NOTE — Progress Notes (Signed)
 PODIATRY: PROGRESS NOTE    Surgery:  Procedures (LRB): IRRIGATION AND DEBRIDEMENT FOOT (Right) POD:  1 Day Post-Op  O/N: NAEON  Subjective:  Patient resting comfortably at bedside. Patient reports he has difficulty remaining in bed but will do so if it is in best interest. No issues immediately post-operatively. No pain. Patient reports he has family near by and walker / WC capacity at home for when he eventually DC. Denies F/C/N/V/SOB/CP. Denies acute calf pain.    PHYSICAL EXAMINATION: BP 132/83 (BP Location: Left Leg)   Pulse 67   Temp 98.7 F (37.1 C) (Oral)   Resp 18   Ht 6' 1 (1.854 m)   Wt 112.9 kg   SpO2 100%   BMI 32.85 kg/m ? GEN: NAD. AOX3. ? RESP: Non-labored breathing on RA.? ABD: NT/ND of all four quadrants.? NEURO: Moving all four extremities spontaneously. ? ? FOCUSED LOWER EXTREMITY EXAMINATION:? RIGHT foot  NEURO: ? - SLIT to tib/saph/dp/sp/sural nerve distributions. ? - No paresthesias elicited on examination. ??  VASCULAR: ? - Capillary refill sluggish - Focal edema noted adjacent to surgical site ; as expected for post-operative state. ??  MSK: ? 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 - s/p fibular sesamoidectomy  - No calf tenderness. ? - Negative Homan's maneuver.  ??  DERM: ? - Dressings c/d/I. ? - Open surgical site/ wound with no purulent / active drainage. ? - No proximal streaking. No malodor. ? - No clinical signs of infection noted. ??  2 distinct wounds.  Approx 2.0 cm x 2.0 cm - sub 1st metatarsal head. Mild maceration / duskiness to the adjacent wound edges. No significant necrosis of wound bed.  Approx 1.5cm x 1.5cm - plantar central heel - surgicel obscuring wound bed - adjacent tissue with improved normotrophic skin.No adjacent skin fluctuance. No purulence.       Results for orders placed or performed during the hospital encounter of 07/30/24  Aerobic/Anaerobic Culture w Gram Stain  (surgical/deep wound)     Status: None (Preliminary result)   Collection Time: 07/30/24  7:34 PM   Specimen: Toe; Tissue  Result Value Ref Range Status   Specimen Description   Final    TOE Performed at [facility], [address], [place], [place] [address]    Special Requests   Final    NONE Performed at [facility], [address] [place], [place] [address]    Gram Stain   Final    RARE WBC SEEN FEW GRAM POSITIVE COCCI FEW GRAM NEGATIVE RODS RARE GRAM POSITIVE RODS    Culture   Final    FEW STAPHYLOCOCCUS AUREUS FEW GROUP B STREP(S.AGALACTIAE)ISOLATED FEW ENTEROCOCCUS FAECALIS NO ANAEROBES ISOLATED; CULTURE IN PROGRESS FOR 5 DAYS TESTING AGAINST S. AGALACTIAE NOT ROUTINELY PERFORMED DUE TO PREDICTABILITY OF AMP/PEN/VAN SUSCEPTIBILITY. Performed at [facility], [address] [place], [place] [address]    Report Status PENDING  Incomplete   Organism ID, Bacteria STAPHYLOCOCCUS AUREUS  Final   Organism ID, Bacteria ENTEROCOCCUS FAECALIS  Final      Susceptibility   Enterococcus faecalis - MIC*    AMPICILLIN <=2 SENSITIVE Sensitive     VANCOMYCIN  1 SENSITIVE Sensitive     GENTAMICIN SYNERGY RESISTANT Resistant     LINEZOLID  2 SENSITIVE Sensitive     * FEW ENTEROCOCCUS FAECALIS   Staphylococcus aureus - MIC*    CIPROFLOXACIN <=0.5 SENSITIVE Sensitive     ERYTHROMYCIN <=0.25 SENSITIVE Sensitive  GENTAMICIN <=0.5 SENSITIVE Sensitive     OXACILLIN <=0.25 SENSITIVE Sensitive     TETRACYCLINE <=1 SENSITIVE Sensitive     VANCOMYCIN  1 SENSITIVE Sensitive     TRIMETH/SULFA <=10 SENSITIVE Sensitive     CLINDAMYCIN <=0.25 SENSITIVE Sensitive     RIFAMPIN <=0.5 SENSITIVE Sensitive     Inducible Clindamycin NEGATIVE Sensitive     LINEZOLID  2 SENSITIVE Sensitive     * FEW STAPHYLOCOCCUS AUREUS  MRSA Next Gen by PCR, Nasal     Status: None   Collection Time: 07/30/24  9:00 PM   Specimen: Nasal Mucosa; Nasal Swab  Result Value Ref Range Status    MRSA by PCR Next Gen NOT DETECTED NOT DETECTED Final    Comment: (NOTE) The GeneXpert MRSA Assay (FDA approved for NASAL specimens only), is one component of a comprehensive MRSA colonization surveillance program. It is not intended to diagnose MRSA infection nor to guide or monitor treatment for MRSA infections. Test performance is not FDA approved in patients less than 75 years old. Performed at [facility], [address] [place], [place] [address]   Aerobic/Anaerobic Culture w Gram Stain (surgical/deep wound)     Status: None (Preliminary result)   Collection Time: 08/02/24 10:34 AM   Specimen: Wound  Result Value Ref Range Status   Specimen Description   Final    WOUND Performed at [facility], [address] [place], [place] [address]    Special Requests   Final    RIGHT FOOT CULTURE Performed at [facility], [address] [place], [place] [address]    Gram Stain NO WBC SEEN NO ORGANISMS SEEN   Final   Culture   Final    CULTURE REINCUBATED FOR BETTER GROWTH Performed at [facility], [address], [place], [place] [address]    Report Status PENDING  Incomplete  Aerobic/Anaerobic Culture w Gram Stain (surgical/deep wound)     Status: None (Preliminary result)   Collection Time: 08/02/24 10:35 AM   Specimen: Wound  Result Value Ref Range Status   Specimen Description   Final    TISSUE Performed at [facility], [address], [place], [place] [address]    Special Requests   Final    SESAMOID RIGHT FOOT Performed at [facility], [address] [place], [place] [address]    Gram Stain PENDING  Incomplete   Culture   Final    CULTURE REINCUBATED FOR BETTER GROWTH Performed at [facility], [address], [place], [place] [address]    Report Status PENDING  Incomplete    ID Type Source Tests Collected by Time Destination  1 : Sesamoid rIght foot Tissue PATH Other SURGICAL PATHOLOGY Ashley Soulier, DPM 08/02/2024 1036   A : RIght foot culture Wound Wound AEROBIC/ANAEROBIC CULTURE W GRAM STAIN (SURGICAL/DEEP WOUND) Ashley Soulier, DPM 08/02/2024 1034   B : Sesamoid rIght foot Wound Wound AEROBIC/ANAEROBIC CULTURE W GRAM STAIN (SURGICAL/DEEP WOUND) Ashley Soulier, DPM 08/02/2024 1035      ASSESSMENT:?  Daniel Ritter is a 49 y.o. male ?s/p IRRIGATION AND DEBRIDEMENT FOOT (Right) with fibular sesamoidectomy Diabetic neuropathic ulcer right foot Osteomyelitis Heel ulcer full-thickness  PLAN:? - Activity: NWB to the RLE   - Diet: Per primary   - Wound Care:  Wound Care: Daily - Podiatry to conduct x 1 on 08/03/24 Materials:  Betadine  Saline, Gauze (4x4) / Kerlix / 4 inch ACE bandage Instructions: Saline moistened gauze to  pack in wound base, cover with dry gauze, paint surrounding wound tissue with betadine , cover all dressings with ABD pad and anchor with kerlix /  ACE Supplement Reccs: Okay to reinforce with dry gauze / loose ACE overlap prn strikethrough. Please leave underlayer intact.  - ABX: Appreciate assistance with antibiotic stewardship from medicine / pharmacy / ID services.  Anti-infectives (From admission, onward)    Start     Dose/Rate Route Frequency Ordered Stop   07/30/24 2012  piperacillin -tazobactam (ZOSYN ) IVPB 3.375 g        3.375 g 12.5 mL/hr over 240 Minutes Intravenous Every 8 hours 07/30/24 2012     07/30/24 1800  piperacillin -tazobactam (ZOSYN ) IVPB 3.375 g  Status:  Discontinued        3.375 g 12.5 mL/hr over 240 Minutes Intravenous Every 8 hours 07/30/24 1731 07/30/24 2012   07/30/24 1745  vancomycin  (VANCOCIN ) IVPB 1000 mg/200 mL premix       Placed in Followed by Linked Group   1,000 mg 200 mL/hr over 60 Minutes Intravenous  Once 07/30/24 1733 07/30/24 1952   07/30/24 1745  vancomycin  (VANCOREADY) IVPB 1500 mg/300 mL       Placed in Followed by Linked Group   1,500 mg 150 mL/hr over 120 Minutes Intravenous Once 07/30/24 1733 07/31/24  0026   07/30/24 1430  cefTRIAXone  (ROCEPHIN ) 1 g in sodium chloride  0.9 % 100 mL IVPB        1 g 200 mL/hr over 30 Minutes Intravenous  Once 07/30/24 1424 07/30/24 1601       - Dispo: Will need another eval from podiatry 08/04/2024 prior to DC. Then 1 week close follow up with podiatry in outpatient setting.

## 2024-08-03 NOTE — Progress Notes (Signed)
" °  Progress Note   Patient: Daniel Ritter FMW:[id number] DOB: May 25, 1976 DOA: 07/30/2024     4 DOS: the patient was seen and examined on 08/03/2024   Brief hospital course:  49 y.o. male with medical history significant of IIDM, chronic right foot diabetic ulcer, PVD status post angioplasty and right popliteal stenting in September 2025, HTN, HLD, peripheral neuropathy, presented with worsening of right foot infection.    Assessment and Plan:  Right foot diabetic ulcers with concern for osteomyelitis Peripheral vascular disease S/p recent angioplasty and right popliteal stenting. - Evaluated by podiatry.   MRI of the foot showed concern for forefoot ulcer penetrating with sesamoid osteomyelitis.  Ulcer on heel noted to have no abscess or concern for osteomyelitis.   Patient is status post excision of fibular sesamoid plantar right foot 2.  Full-thickness ulcer debridement right first MTPJ to capsule and tendon 3.  Full-thickness excisional debridement ulcer to subcutaneous tissue plantar right heel 4.  Intraoperative fluoroscopy use without assistance of radiologist  Surgical pathology showed fragments of bone with no specific pathology change.  Negative for osteomyelitis. Continue statins and metoprolol  Continue antibiotic therapy with IV Zosyn  Aspirin  and Plavix  were held for planned procedure.  Will discuss with podiatry when to resume    Early diabetic ketoacidosis Uncontrolled insulin  dependent diabetes mellitus with hyperglycemia - Likely exacerbated by underlying infection.   On presentation, glucose was greater than 500, mild acidemia, elevated beta hydroxybutyrate.   Patient was initiated on IV insulin  drip, DKA protocol.  Showed marked improvement in acidemia and glucose values.  Subsequently transitioned to subcutaneous insulin . Continues to have hyperglycemia and received a dose of systemic steroids Increase Lantus  to 26 units nightly and continue NovoLog  4 units with meals       Hypertension Continue metoprolol , lisinopril  and hydrochlorothiazide        Seizure disorder Continue Keppra         Subjective: No new complaints  Physical Exam: Vitals:   08/02/24 1530 08/02/24 2211 08/03/24 0340 08/03/24 0728  BP: 110/69 135/86 122/81 132/83  Pulse: 84 76 66 67  Resp: 17 20 20 18   Temp: 98.5 F (36.9 C) 98.8 F (37.1 C) 98.1 F (36.7 C) 98.7 F (37.1 C)  TempSrc:    Oral  SpO2: 97% 96% 97% 100%  Weight:      Height:       GENERAL:  Alert, pleasant, no acute distress  HEENT:  EOMI CARDIOVASCULAR:  RRR, no murmurs appreciated RESPIRATORY:  Clear to auscultation, no wheezing, rales, or rhonchi GASTROINTESTINAL:  Soft, nontender, nondistended EXTREMITIES:  No LE edema bilaterally NEURO:  No new focal deficits appreciated SKIN: Right foot with fresh bandage PSYCH:  Appropriate mood and affect   Data Reviewed: Labs reviewed.  Magnesium  1.6, sodium 133, glucose 239, white count 10.9,  hemoglobin 11.1 Labs reviewed  Family Communication: Plan of care was discussed with patient in detail.  He verbalizes understanding and agrees to the plan  Disposition: Status is: Inpatient Remains inpatient appropriate because: Discharge planning  Planned Discharge Destination: Home with Home Health    Time spent: 50 minutes  Author: Aimee Somerset, MD 08/03/2024 1:28 PM  For on call review [email or web address].  "

## 2024-08-03 NOTE — Progress Notes (Signed)
 Physical Therapy Treatment Patient Details Name: Daniel Ritter MRN: [id number] DOB: 03/09/1976 Today's Date: 08/03/2024   History of Present Illness Pt is a 49 yo male s/p I&D of R foot. PMH of  DM, R foot diabetic ulcer, PVD s/p angioplasty, R popliteal stenting, HTN, HLD.    PT Comments  Patient received in bed, he is pleasant and agrees to PT session. States he has ambulated to bathroom. Patient is independent with bed mobility and transfers/ambulates in room with supervision and RW. Ambulated ~24 feet. No lob, but fatigued with this. He will continue to benefit from skilled PT to improve strength, endurance and safety with mobility.       If plan is discharge home, recommend the following: A little help with bathing/dressing/bathroom;Assistance with cooking/housework;Assist for transportation;Help with stairs or ramp for entrance   Can travel by private vehicle      yes  Equipment Recommendations  Rolling walker (2 wheels)    Recommendations for Other Services       Precautions / Restrictions Precautions Precautions: Fall Recall of Precautions/Restrictions: Intact Restrictions Weight Bearing Restrictions Per Provider Order: Yes RLE Weight Bearing Per Provider Order: Non weight bearing     Mobility  Bed Mobility Overal bed mobility: Independent Bed Mobility: Supine to Sit, Sit to Supine     Supine to sit: Independent Sit to supine: Independent        Transfers Overall transfer level: Needs assistance Equipment used: Rolling walker (2 wheels) Transfers: Sit to/from Stand Sit to Stand: Supervision                Ambulation/Gait Ambulation/Gait assistance: Supervision Gait Distance (Feet): 24 Feet Assistive device: Rolling walker (2 wheels) Gait Pattern/deviations: Step-to pattern Gait velocity: decr     General Gait Details: patient able to hop in room, supervision. No lob, fatigued with this.   Stairs             Wheelchair Mobility      Tilt Bed    Modified Rankin (Stroke Patients Only)       Balance Overall balance assessment: Modified Independent Sitting-balance support: Feet supported Sitting balance-Leahy Scale: Normal     Standing balance support: Bilateral upper extremity supported, During functional activity, Reliant on assistive device for balance Standing balance-Leahy Scale: Fair                              Hotel Manager: No apparent difficulties  Cognition Arousal: Alert Behavior During Therapy: WFL for tasks assessed/performed   PT - Cognitive impairments: No apparent impairments                         Following commands: Intact      Cueing Cueing Techniques: Verbal cues  Exercises      General Comments        Pertinent Vitals/Pain Pain Assessment Pain Assessment: Faces Faces Pain Scale: Hurts a little bit Pain Descriptors / Indicators: Discomfort, Sore Pain Intervention(s): Monitored during session, Repositioned, Premedicated before session    Home Living                          Prior Function            PT Goals (current goals can now be found in the care plan section) Acute Rehab PT Goals Patient Stated Goal: to go home PT Goal Formulation: With  patient Time For Goal Achievement: 08/16/24 Potential to Achieve Goals: Good Progress towards PT goals: Progressing toward goals    Frequency    Min 2X/week      PT Plan      Co-evaluation              AM-PAC PT 6 Clicks Mobility   Outcome Measure  Help needed turning from your back to your side while in a flat bed without using bedrails?: None Help needed moving from lying on your back to sitting on the side of a flat bed without using bedrails?: None Help needed moving to and from a bed to a chair (including a wheelchair)?: None Help needed standing up from a chair using your arms (e.g., wheelchair or bedside chair)?: None Help needed to  walk in hospital room?: A Little Help needed climbing 3-5 steps with a railing? : A Lot 6 Click Score: 21    End of Session   Activity Tolerance: Patient limited by fatigue;Patient tolerated treatment well Patient left: in bed Nurse Communication: Mobility status PT Visit Diagnosis: Other abnormalities of gait and mobility (R26.89);Difficulty in walking, not elsewhere classified (R26.2);Muscle weakness (generalized) (M62.81)     Time: 1350-1400 PT Time Calculation (min) (ACUTE ONLY): 10 min  Charges:    $Gait Training: 8-22 mins PT General Charges $$ ACUTE PT VISIT: 1 Visit                     Syncere Kaminski, PT, GCS 08/03/24,2:03 PM

## 2024-08-03 NOTE — Inpatient Diabetes Management (Addendum)
 Inpatient Diabetes Program Recommendations  AACE/ADA: New Consensus Statement on Inpatient Glycemic Control (2015)  Target Ranges:  Prepandial:   less than 140 mg/dL      Peak postprandial:   less than 180 mg/dL (1-2 hours)      Critically ill patients:  140 - 180 mg/dL    Latest Reference Range & Units 07/30/24 11:29  Glucose 70 - 99 mg/dL 407 (HH)  (HH): Data is critically high  Latest Reference Range & Units 07/30/24 14:45  Hemoglobin A1C 4.8 - 5.6 % 11.5 (H)  283 mg/dl  (H): Data is abnormally high  Latest Reference Range & Units 08/02/24 07:57 08/02/24 10:58 08/02/24 11:50 08/02/24 17:08 08/02/24 22:13  Glucose-Capillary 70 - 99 mg/dL 811 (H)  4 units Novolog   187 (H)  4 mg Decadron  @1009  206 (H)  11 units Novolog   308 (H)  19 units Novolog   218 (H)  2 units Novolog   22 units Lantus   (H): Data is abnormally high  Latest Reference Range & Units 08/03/24 07:24  Glucose-Capillary 70 - 99 mg/dL 780 (H)  (H): Data is abnormally high  Admit with: Right foot diabetic ulcers with concern for osteomyelitis/ Early DKA  History: DM2  Home DM Meds: Metformin  1000 mg BID        Januvia 100 mg daily (NOT taking)  Current Orders: Lantus  26 units at HS     Novolog  Resistant Correction Scale/ SSI (0-20 units) TID AC      Novolog  4 units TID with meals    MD- Note pt received 4 mg Decadron  X 1 dose yest at 10am for Surgery CBG this AM elevated likely from the Steroids Note Lantus  increased to 26 units for tonight   Addendum 1:15pm--Met w/ pt at bedside.  Pt A&O and able to have meaningful conversation and was eager to learn.   We discussed his current A1c and the need to send him back home on insulin .  Pt agreeable to restarting insulin .  Told me he was taking insulin  until about 1 year ago when his A1c dropped dramatically and his PCP stopped the Insulin  but continued the Metformin .  Told me he has had issues getting his Januvia refilled so he has not been taking the  Januvia.  Checks CBGs at home with fingerstick meter and told me he thinks he has 3 meters at home but did state interest in using CGM at home.  OP pharmacy checked benefits and pt can get Freestyle Libre 3 CGM for $0 co-pay.  I helped pt to download the [place] app to his phone and set the app up.  Reviewed Freestyle Libre 3 Plus CGM system (how to use, how to place new sensor, sensor life, troubleshooting, warm-up time, how to use app on phone, rotation of insertion sites, Vitamin C  warning, etc).  Assisted pt to download the Freestyle app to his Phone.  Assisted pt to place [place] sensor on L Upper arm.  Pt knows they will able to check app for glucose readings 1hr after sensor placed.  Pt instructed to replace sensor in 15 days and use opposite arm.  Encouraged pt to open app and look at sensor readings pre and post meals.  Reviewed healthy glucose goals for home.  Pt asked to seek refills for the sensors from their PCP.  Pt educated to check fingerstick CBG with traditional CBG meter when glucose reading does not match how they feels.  RN made aware that CGM applied.  We also reviewed how to  use insulin  pen at home and pt able to properly verbalize all the steps of using an insulin  pen, storage of insulin , disposal of sharps, etc.  Explained to pt the difference between long and quick acting insulin  and the importance of timing.  Pt told me if he has to take Novolog  with meals, he has safe place to store the insulin  at work.  I called pt's sister with his permission and gave her an update and review of all of the above info as well.  Sister knows that I started a Glucose sensor on pt as well.      Insurance verification completed.     The patient is insured through [facility].      Ran test claim for Lantus  100unit Pen and the current 30 day co-pay is $4.   Ran test claim for Humalog 100unit Pen and the current 30 day co-pay is $4.  Pt has also been approved for [employer] 3 CGM with $0  co-pay    --Will follow patient during hospitalization--  Adina Rudolpho Arrow RN, MSN, CDCES Diabetes Coordinator Inpatient Glycemic Control Team Team Pager: [phone] (8a-5p)

## 2024-08-03 NOTE — Plan of Care (Signed)
" °  Problem: Health Behavior/Discharge Planning: Goal: Ability to identify and utilize available resources and services will improve Outcome: Progressing   Problem: Metabolic: Goal: Ability to maintain appropriate glucose levels will improve Outcome: Progressing   Problem: Tissue Perfusion: Goal: Adequacy of tissue perfusion will improve Outcome: Progressing   "

## 2024-08-04 ENCOUNTER — Other Ambulatory Visit: Payer: Self-pay

## 2024-08-04 DIAGNOSIS — E11628 Type 2 diabetes mellitus with other skin complications: Principal | ICD-10-CM

## 2024-08-04 DIAGNOSIS — L089 Local infection of the skin and subcutaneous tissue, unspecified: Secondary | ICD-10-CM

## 2024-08-04 LAB — GLUCOSE, CAPILLARY
Glucose-Capillary: 179 mg/dL — ABNORMAL HIGH (ref 70–99)
Glucose-Capillary: 186 mg/dL — ABNORMAL HIGH (ref 70–99)
Glucose-Capillary: 194 mg/dL — ABNORMAL HIGH (ref 70–99)
Glucose-Capillary: 229 mg/dL — ABNORMAL HIGH (ref 70–99)

## 2024-08-04 MED ORDER — PEN NEEDLES 32G X 4 MM MISC
1 refills | Status: AC
  Filled 2024-08-04: qty 100, 25d supply, fill #0

## 2024-08-04 MED ORDER — ASPIRIN 81 MG PO TBEC
81.0000 mg | DELAYED_RELEASE_TABLET | Freq: Every day | ORAL | 12 refills | Status: AC
  Filled 2024-08-04: qty 30, 30d supply, fill #0

## 2024-08-04 MED ORDER — CLOPIDOGREL BISULFATE 75 MG PO TABS
75.0000 mg | ORAL_TABLET | Freq: Every day | ORAL | 0 refills | Status: AC
  Filled 2024-08-04: qty 30, 30d supply, fill #0

## 2024-08-04 MED ORDER — INSULIN ASPART 100 UNIT/ML FLEXPEN
4.0000 [IU] | PEN_INJECTOR | Freq: Three times a day (TID) | SUBCUTANEOUS | 11 refills | Status: AC
  Filled 2024-08-04: qty 9, 75d supply, fill #0

## 2024-08-04 MED ORDER — INSULIN GLARGINE 100 UNIT/ML SOLOSTAR PEN
26.0000 [IU] | PEN_INJECTOR | Freq: Every day | SUBCUTANEOUS | 0 refills | Status: AC
  Filled 2024-08-04: qty 6, 23d supply, fill #0

## 2024-08-04 MED ORDER — ROSUVASTATIN CALCIUM 20 MG PO TABS
20.0000 mg | ORAL_TABLET | Freq: Every day | ORAL | 1 refills | Status: AC
  Filled 2024-08-04: qty 30, 30d supply, fill #0

## 2024-08-04 MED ORDER — AMOXICILLIN-POT CLAVULANATE 875-125 MG PO TABS
1.0000 | ORAL_TABLET | Freq: Two times a day (BID) | ORAL | 0 refills | Status: AC
  Filled 2024-08-04: qty 20, 10d supply, fill #0

## 2024-08-04 MED ORDER — OXYCODONE-ACETAMINOPHEN 5-325 MG PO TABS
1.0000 | ORAL_TABLET | Freq: Four times a day (QID) | ORAL | 0 refills | Status: AC | PRN
  Filled 2024-08-04: qty 28, 7d supply, fill #0

## 2024-08-04 NOTE — Discharge Summary (Addendum)
 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 [facility] Course: Daniel Ritter is a 49 y.o. male with medical history significant of IIDM, chronic right foot diabetic ulcer, PVD status post angioplasty and right popliteal stenting in September 2025, HTN, HLD, peripheral neuropathy, presented with worsening of right foot infection.   Patient reported that he has 2 ulcers on the right foot, 1 on the right heel developed 6 months ago, the second 1 is on the bottom of first metatarsal developed 2 months ago, and he has been following with Dr. Fernande for the 2 ulcers and was recently debrided in the office 2 weeks ago.  He was told both ulcers appear to be noninfected.  2 days ago he woke up and noticed swelling of his right foot and since yesterday he developed pain on the right forefoot but denied any fever no nauseous vomiting.   ED Course: Afebrile, nontachycardic no hypotension, blood work showed WBC 9.0 hemoglobin 12.6 BUN 15 creatinine 0.9 glucose 592 bicarb 18, beta hydroxy 1.1, K4.6.   Patient was given ceftriaxone  and vancomycin  in the ED and started on insulin  drip.    Assessment and Plan:  Diabetic foot ulcer with an infection Peripheral vascular disease S/p  recent angioplasty and right popliteal stenting. - Evaluated by podiatry.   MRI of the foot showed concern for forefoot ulcer penetrating with sesamoid osteomyelitis.  Ulcer on heel noted to have no abscess or concern for osteomyelitis.   Patient is status post excision of fibular sesamoid plantar right foot 2.  Full-thickness ulcer debridement right first MTPJ to capsule and tendon 3.  Full-thickness excisional debridement ulcer to subcutaneous tissue plantar right heel 4.  Intraoperative fluoroscopy use without assistance of radiologist  Surgical pathology showed fragments of bone with no specific pathology change.  Negative for osteomyelitis. Continue aspirin , Plavix , statins and metoprolol  Wound cultures yielded MSSA, Enterococcus faecalis Patient was on IV Zosyn  and will be discharged home on Augmentin  875 mg p.o. twice daily Patient to follow-up with podiatry as an outpatient       Early diabetic ketoacidosis Uncontrolled insulin  dependent diabetes mellitus with hyperglycemia - Likely exacerbated by underlying infection.   On presentation, glucose was greater than 500, mild acidemia, elevated beta hydroxybutyrate.   Patient was initiated on IV insulin  drip, DKA protocol.  Showed marked improvement in acidemia and glucose values.  Subsequently transitioned to subcutaneous insulin . Patient will be discharged on Lantus  to 26 units nightly and continue NovoLog  4 units with meals     Hypertension Continue metoprolol , lisinopril  and hydrochlorothiazide      Seizure disorder Continue Keppra                Consultants: Podiatry Procedures performed:  Excision of fibular sesamoid plantar right foot 2.  Full-thickness ulcer debridement right first  MTPJ to capsule and tendon 3.  Full-thickness excisional debridement ulcer to subcutaneous tissue plantar right heel 4.  Intraoperative fluoroscopy use without assistance of radiologist  Disposition: Home health Diet recommendation:   Discharge Diet Orders (From admission, onward)     Start     Ordered   08/04/24 0000  Diet Carb Modified        08/04/24 1409   08/04/24 0000  Diet - low sodium heart healthy        08/04/24 1409           Cardiac and Carb modified diet DISCHARGE MEDICATION: Allergies as of 08/04/2024   No Known Allergies      Medication List     STOP taking these medications    amLODipine  10 MG tablet Commonly known as: NORVASC    ascorbic acid  500 MG tablet Commonly known as: VITAMIN C    atorvastatin  20 MG tablet Commonly known as: LIPITOR   chlorhexidine  0.12 % solution Commonly known as: PERIDEX    DULoxetine  30 MG capsule Commonly known as: CYMBALTA    Januvia 100 MG tablet Generic drug: sitaGLIPtin   pantoprazole  40 MG tablet Commonly known as: Protonix    triamcinolone  cream 0.5 % Commonly known as: KENALOG    zinc  sulfate (50mg  elemental zinc ) 220 (50 Zn) MG capsule       TAKE these medications    acetaminophen  325 MG tablet Commonly known as: TYLENOL  Take 2 tablets (650 mg total) by mouth every 6 (six) hours as needed for mild pain (or Fever >/= 101).   ALPRAZolam  0.25 MG tablet Commonly known as: XANAX  Take 0.25 mg by mouth 2 (two) times daily. What changed:  when to take this reasons to take this   amoxicillin -clavulanate 875-125 MG tablet Commonly known as: AUGMENTIN  Take 1 tablet by mouth 2 (two) times daily for 10 days.   aspirin  EC 81 MG tablet Take 1 tablet (81 mg total) by mouth daily. Swallow whole. Start taking on: August 05, 2024   clopidogrel  75 MG tablet Commonly known as: Plavix  Take 1 tablet (75 mg total) by mouth daily. Start taking on: August 05, 2024   Insulin  Aspart FlexPen 100 UNIT/ML Commonly known as: NOVOLOG  Inject 4 Units into the skin 3 (three) times daily with meals.   Insupen Pen Needles 32G X 4 MM Misc Generic drug: Insulin  Pen Needle Inject Lantus  nightly and NovoLog  with meals   Lantus  SoloStar 100 UNIT/ML  Solostar Pen Generic drug: insulin  glargine Inject 26 Units into the skin at bedtime.   levETIRAcetam  500 MG tablet Commonly known as: KEPPRA  Take 1 tablet (500 mg total) by mouth 2 (two) times daily.   lisinopril -hydrochlorothiazide  20-25 MG tablet Commonly known as: ZESTORETIC  Take 1 tablet by mouth daily.   metFORMIN  1000 MG tablet Commonly known as: GLUCOPHAGE  Take 1,000 mg by mouth 2 (two) times daily with a meal.   metoprolol  succinate 100 MG 24 hr tablet Commonly known as: TOPROL -XL Take 100 mg by mouth daily. Take with or immediately following a meal.   multivitamin with minerals tablet Take 1 tablet by mouth daily.   oxyCODONE -acetaminophen  5-325 MG tablet Commonly known as: Percocet Take 1 tablet by mouth every 6 (six) hours as needed for up to 7 days for severe pain (pain score 7-10).   rosuvastatin  20 MG tablet Commonly known as: CRESTOR  Take 1 tablet (20 mg total) by mouth daily. Start taking on: August 05, 2024               Discharge  Care Instructions  (From admission, onward)           Start     Ordered   08/04/24 0000  Discharge wound care:       Comments: Dressing changes which would consist of daily Betadine  dressings.   08/04/24 1409            Discharge Exam: Filed Weights   07/30/24 1721 07/30/24 1920 08/02/24 0916  Weight: 114 kg 113.1 kg 112.9 kg   GENERAL:  Alert, pleasant, no acute distress  HEENT:  EOMI CARDIOVASCULAR:  RRR, no murmurs appreciated RESPIRATORY:  Clear to auscultation, no wheezing, rales, or rhonchi GASTROINTESTINAL:  Soft, nontender, nondistended EXTREMITIES:  No LE edema bilaterally NEURO:  No new focal deficits appreciated SKIN: Right foot with fresh bandage PSYCH:  Appropriate mood and affect    Condition at discharge: stable  The results of significant diagnostics from this hospitalization (including imaging, microbiology, ancillary and laboratory) are listed below for reference.   Imaging  Studies: DG MINI C-ARM IMAGE ONLY Result Date: 08/02/2024 There is no interpretation for this exam.  This order is for images obtained during a surgical procedure.  Please See Surgeries Tab for more information regarding the procedure.   MR ANKLE RIGHT WO CONTRAST Result Date: 07/31/2024 EXAM: MRI of the right Ankle without contrast. 07/31/2024 08:49:14 AM TECHNIQUE: Multiplanar multisequence MRI of the right ankle was performed without the administration of intravenous contrast. COMPARISON: Forefoot MRI from 07/30/2024 and ankle radiographs from 02/03/2024. CLINICAL HISTORY: Ulcer right heel. FINDINGS: SYNDESMOTIC LIGAMENTS: Intact anterior inferior tibiofibular ligament and posterior inferior tibiofibular ligament. No significant periligamentous edema or interval widening. LATERAL COLLATERAL LIGAMENT COMPLEX: Intact anterior talofibular ligament, posterior talofibular ligament and calcaneofibular ligament. DELTOID LIGAMENT COMPLEX: Intact superficial and deep components. SINUS TARSI AND SPRING LIGAMENT: Thickened superomedial portion of the spring ligament with a fluid filled larger than normal gap between the medioplantar oblique and inferoplantar longitudinal portions of the spring ligament possibly reflecting the chronically fragmented navicular, suggesting some degree of tearing of the medioplantar oblique portion. Normal appearance of the sinus tarsi. MEDIAL TENDONS: Distal tibialis posterior tenosynovitis. Intact flexor digitorum and flexor hallucis longus tendons. LATERAL TENDONS: Mild common peroneal tendon sheath tenosynovitis with tendinopathy and potentially mild longitudinal split tearing of the peroneus brevis tendon just below the lateral malleolus. Intact peroneus longus tendon. ANTERIOR TENDONS: The tibialis anterior, extensor hallucis longus and extensor digitorum longus tendons are normal in position, morphology and signal. ACHILLES TENDON: The Achilles tendon is normal in position,  morphology and signal. No associated bursitis. PLANTAR FASCIA: Plantar heel ulceration extending up towards the plantar fascia as on image 13 series 8, with associated edema in the subcutaneous tissues and extending to the superficial margin of the plantar fascia. No plantar fascia tear or abscess identified. The medial and lateral bundles of the plantar fascia are normal in morphology and signal. No plantar fascial nodules. Mild dorsal subcutaneous edema along the ankle. TARSAL TUNNEL: There are no obstructing lesions within the tarsal tunnel. 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 There is mostly fluid within the gap. Type 1 accessory navicular. No findings of active osteomyelitis. JOINT SPACES: No significant joint effusion. Low level arthropathy between the navicular fragments and the adjacent cuneiforms. Fissure dorsal talonavicular ligament in the vicinity of the gap/fracture, with dorsal talar spurring. Normal alignment. IMPRESSION: 1. Plantar heel ulceration  extending toward the plantar fascia with adjacent subcutaneous edema, without abscess, plantar fascia tear, or active osteomyelitis. 2. Chronic nonunited sagittally oriented navicular fracture with 1.1 cm fragment separation and fluid within the gap. There is also a smaller inferior navicular fragment. 3. Low-grade arthropathy between the navicular fragments and adjacent cuneiforms, with fissuring of the dorsal talonavicular ligament and dorsal talar spurring. 4. Thickening of the superomedial spring ligament with suspected tearing of the medioplantar oblique component. 5. Mild common peroneal tendon sheath tenosynovitis with tendinopathy and possible mild longitudinal split tearing of the peroneus brevis tendon just below the lateral malleolus. 6. Distal tibialis posterior  tenosynovitis with type 1 accessory navicular. Electronically signed by: Ryan Salvage MD 07/31/2024 10:14 AM EST RP Workstation: [id number]   US  Venous Img Lower Unilateral Right (DVT) Result Date: 07/30/2024 EXAM: ULTRASOUND DUPLEX OF THE RIGHT LOWER EXTREMITY VEINS TECHNIQUE: Duplex ultrasound using B-mode/gray scaled imaging and Doppler spectral analysis and color flow was obtained of the deep venous structures of the right lower extremity. COMPARISON: Comparison with 01/13/2023. CLINICAL HISTORY: Swelling. ICD10: [id number] Injury of blood vessels of thorax without mention of open wound into cavity. FINDINGS: The common femoral vein, femoral vein, popliteal vein, and posterior tibial vein demonstrate normal compressibility with normal color flow and spectral analysis. IMPRESSION: 1. No evidence of deep venous thrombosis in the examined lower extremity. Electronically signed by: Elsie Gravely MD 07/30/2024 03:51 PM EST RP Workstation: [id number]   MR FOOT RIGHT W WO CONTRAST Result Date: 07/30/2024 EXAM: MRI of the Right Foot with and without contrast. 07/30/2024 03:15:52 PM TECHNIQUE: Multiplanar multisequence MRI of the right forefoot was performed with and without the administration of intravenous contrast. Contrast administered was 10 mL Gadobutrol  (GADAVIST ) 1 MMOL/ML injection. COMPARISON: Radiographs 07/30/2024. CLINICAL HISTORY: R foot infection, concern for osteomyelitis. Right foot infection, concern for osteomyelitis. 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 Visualized Lisfranc ligament is intact. No significant Lisfranc interval widening or significant periligamentous edema. BONE MARROW: Low level edema signal and enhancement in the lateral sesamoid of the 1st digit as on image 22 series 10 suspicious for sesamoiditis or osteomyelitis. No other suspicious findings for osteomyelitis. No acute fracture or aggressive marrow replacing lesion. GREATER  AND LESSER MTP JOINTS: Underlying the 1st metatarsophalangeal joint there is a cutaneous ulceration on image 22 series 4 with abnormal subcutaneous signal extending up towards the lateral sesamoid. No drainable abscess or joint effusion. No significant osseous erosions. No significant degenerative changes. Normal alignment. SOFT TISSUES: Interval change deepening ulcer of the medial right foot. Dorsal subcutaneous edema in the foot tracks and to the toes, cellulitis not excluded. Abnormal subcutaneous signal extending up towards the lateral sesamoid. No drainable abscess. TENDONS: Visualized flexor and extensor tendons are intact without tenosynovitis. IMPRESSION: 1. Low level edema signal and enhancement in the lateral sesamoid of the 1st digit, suspicious for sesamoiditis or osteomyelitis. 2. Deepening cutaneous ulceration underlying the 1st metatarsophalangeal joint with abnormal subcutaneous signal extending toward the lateral sesamoid, without drainable abscess or joint effusion. 3. Dorsal subcutaneous edema tracking to the toes, cellulitis not excluded. 4. 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 Electronically signed by: Ryan Salvage MD 07/30/2024 03:31 PM EST RP Workstation: [id number]   DG Foot Complete Right Result Date: 07/30/2024 EXAM: 3 OR MORE VIEW(S) XRAY OF THE RIGHT FOOT 07/30/2024 12:45:07 PM COMPARISON: 02/03/2024 CLINICAL HISTORY: Infection, pain, swelling. 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 Navicular  fracture of uncertain chronicity. Osteomyelitis is not excluded. Mild degenerative changes at the 1st metatarsophalangeal joint. No malalignment. SOFT TISSUES: Soft tissue ulceration along the plantar surface of the forefoot and hindfoot. Diffuse forefoot and soft tissue swelling. Soft tissue swelling about the plantar aspect of the hind foot. Diffuse vascular calcifications. IMPRESSION: 1. Navicular fracture of uncertain chronicity.  Osteomyelitis is difficult to exclude. 2. Soft tissue ulceration along the plantar surface of the forefoot and hindfoot with diffuse soft tissue swelling. Electronically signed by: norman gatlin MD 07/30/2024 01:19 PM EST RP Workstation: [id number]    Microbiology: Results for orders placed or performed during the hospital encounter of 07/30/24  Aerobic/Anaerobic Culture w Gram Stain (surgical/deep wound)     Status: None (Preliminary result)   Collection Time: 07/30/24  7:34 PM   Specimen: Toe; Tissue  Result Value Ref Range Status   Specimen Description   Final    TOE Performed at [facility], [address] [place], [place] [address]    Special Requests   Final    NONE Performed at [facility], [address] [place], [place] [address]    Gram Stain   Final    RARE WBC SEEN FEW GRAM POSITIVE COCCI FEW GRAM NEGATIVE RODS RARE GRAM POSITIVE RODS Performed at [facility], [address] [place], [place] [address]    Culture   Final    FEW STAPHYLOCOCCUS AUREUS FEW GROUP B STREP(S.AGALACTIAE)ISOLATED FEW ENTEROCOCCUS FAECALIS TESTING AGAINST S. AGALACTIAE NOT ROUTINELY PERFORMED DUE TO PREDICTABILITY OF AMP/PEN/VAN SUSCEPTIBILITY. RARE MORGANELLA MORGANII CULTURE REINCUBATED FOR BETTER GROWTH NO ANAEROBES ISOLATED; CULTURE IN PROGRESS FOR 5 DAYS    Report Status PENDING  Incomplete   Organism ID, Bacteria STAPHYLOCOCCUS AUREUS  Final   Organism ID, Bacteria ENTEROCOCCUS FAECALIS  Final      Susceptibility   Enterococcus faecalis - MIC*    AMPICILLIN <=2 SENSITIVE Sensitive     VANCOMYCIN  1 SENSITIVE Sensitive     GENTAMICIN SYNERGY RESISTANT Resistant     LINEZOLID  2 SENSITIVE Sensitive     * FEW ENTEROCOCCUS FAECALIS   Staphylococcus aureus - MIC*    CIPROFLOXACIN <=0.5 SENSITIVE Sensitive     ERYTHROMYCIN <=0.25 SENSITIVE Sensitive     GENTAMICIN <=0.5 SENSITIVE Sensitive     OXACILLIN <=0.25 SENSITIVE Sensitive     TETRACYCLINE <=1  SENSITIVE Sensitive     VANCOMYCIN  1 SENSITIVE Sensitive     TRIMETH/SULFA <=10 SENSITIVE Sensitive     CLINDAMYCIN <=0.25 SENSITIVE Sensitive     RIFAMPIN <=0.5 SENSITIVE Sensitive     Inducible Clindamycin NEGATIVE Sensitive     LINEZOLID  2 SENSITIVE Sensitive     * FEW STAPHYLOCOCCUS AUREUS  MRSA Next Gen by PCR, Nasal     Status: None   Collection Time: 07/30/24  9:00 PM   Specimen: Nasal Mucosa; Nasal Swab  Result Value Ref Range Status   MRSA by PCR Next Gen NOT DETECTED NOT DETECTED Final    Comment: (NOTE) The GeneXpert MRSA Assay (FDA approved for NASAL specimens only), is one component of a comprehensive MRSA colonization surveillance program. It is not intended to diagnose MRSA infection nor to guide or monitor treatment for MRSA infections. Test performance is not FDA approved in patients less than 14 years old. Performed at [facility], [address] [place], [place] [address]   Aerobic/Anaerobic Culture w Gram Stain (surgical/deep wound)     Status: None (Preliminary result)   Collection Time: 08/02/24 10:34 AM  Specimen: Wound  Result Value Ref Range Status   Specimen Description   Final    WOUND Performed at [facility], [address] [place], [place] [address]    Special Requests   Final    RIGHT FOOT CULTURE Performed at [facility], [address] [place], [place] [address]    Gram Stain   Final    NO WBC SEEN NO ORGANISMS SEEN Performed at [facility], [address], [place], [place] [address]    Culture   Final    RARE ENTEROCOCCUS FAECALIS RARE ENTEROCOCCUS AVIUM RARE STAPHYLOCOCCUS AUREUS RARE GROUP B STREP(S.AGALACTIAE)ISOLATED TESTING AGAINST S. AGALACTIAE NOT ROUTINELY PERFORMED DUE TO PREDICTABILITY OF AMP/PEN/VAN SUSCEPTIBILITY. NO ANAEROBES ISOLATED; CULTURE IN PROGRESS FOR 5 DAYS    Report Status PENDING  Incomplete  Aerobic/Anaerobic Culture w Gram Stain (surgical/deep wound)     Status: None  (Preliminary result)   Collection Time: 08/02/24 10:35 AM   Specimen: Wound  Result Value Ref Range Status   Specimen Description   Final    TISSUE Performed at [facility], [address] [place], [place] [address]    Special Requests   Final    SESAMOID RIGHT FOOT Performed at [facility], [address] [place], [place] [address]    Gram Stain   Final    RARE WBC SEEN NO ORGANISMS SEEN Performed at [facility], [address], [place], [place] [address]    Culture   Final    RARE ENTEROCOCCUS FAECALIS SUSCEPTIBILITIES PERFORMED ON PREVIOUS CULTURE WITHIN THE LAST 5 DAYS. RARE GROUP B STREP(S.AGALACTIAE)ISOLATED TESTING AGAINST S. AGALACTIAE NOT ROUTINELY PERFORMED DUE TO PREDICTABILITY OF AMP/PEN/VAN SUSCEPTIBILITY. NO ANAEROBES ISOLATED; CULTURE IN PROGRESS FOR 5 DAYS    Report Status PENDING  Incomplete    Labs: CBC: Recent Labs  Lab 07/30/24 1129 07/31/24 0359 08/01/24 0533 08/02/24 0507 08/03/24 0734  WBC 9.0 9.1 9.6 10.1 10.9*  HGB 12.6* 10.5* 9.9* 11.4* 11.1*  HCT 36.0* 29.4* 29.0* 33.6* 33.5*  MCV 84.5 84.7 87.1 87.7 86.8  PLT 187 175 206 194 209   Basic Metabolic Panel: Recent Labs  Lab 07/30/24 2246 07/31/24 0359 08/01/24 0533 08/02/24 0507 08/03/24 0734  NA 137 138 135 134* 133*  K 3.5 3.6 3.8 4.0 4.1  CL 101 103 101 99 97*  CO2 25 24 24 24 25   GLUCOSE 154* 150* 177* 181* 239*  BUN 11 10 10 11 15   CREATININE 0.66 0.70 0.70 0.69 0.79  CALCIUM  9.2 9.0 9.2 9.5 9.1  MG  --   --   --   --  1.6*   Liver Function Tests: No results for input(s): AST, ALT, ALKPHOS, BILITOT, PROT, ALBUMIN in the last 168 hours. CBG: Recent Labs  Lab 08/03/24 2037 08/04/24 0045 08/04/24 0419 08/04/24 0745 08/04/24 1127  GLUCAP 231* 186* 194* 179* 229*    Discharge time spent: greater than 30 minutes.  Signed: Aimee Somerset, MD [facility] 08/04/2024 "

## 2024-08-04 NOTE — Plan of Care (Signed)
  Problem: Education: Goal: Ability to describe self-care measures that may prevent or decrease complications (Diabetes Survival Skills Education) will improve Outcome: Progressing   Problem: Coping: Goal: Ability to adjust to condition or change in health will improve Outcome: Progressing   Problem: Fluid Volume: Goal: Ability to maintain a balanced intake and output will improve Outcome: Progressing   Problem: Health Behavior/Discharge Planning: Goal: Ability to manage health-related needs will improve Outcome: Progressing   Problem: Metabolic: Goal: Ability to maintain appropriate glucose levels will improve Outcome: Progressing   Problem: Nutritional: Goal: Maintenance of adequate nutrition will improve Outcome: Progressing

## 2024-08-04 NOTE — Progress Notes (Signed)
 Patient requested PRN Dilaudid  throughout the evening for right foot pain. Patient states it was more painful throughout the evening.

## 2024-08-04 NOTE — Inpatient Diabetes Management (Signed)
 Inpatient Diabetes Program Recommendations  AACE/ADA: New Consensus Statement on Inpatient Glycemic Control (2015)  Target Ranges:  Prepandial:   less than 140 mg/dL      Peak postprandial:   less than 180 mg/dL (1-2 hours)      Critically ill patients:  140 - 180 mg/dL    Latest Reference Range & Units 08/04/24 07:45  Glucose-Capillary 70 - 99 mg/dL 820 (H)  (H): Data is abnormally high  Home DM Meds: Metformin  1000 mg BID                              Januvia 100 mg daily (NOT taking)   Current Orders: Lantus  26 units at HS                           Novolog  Resistant Correction Scale/ SSI (0-20 units) TID AC                            Novolog  4 units TID with meals   Met w/ pt again at bedside.  Pt showed me his Freestyle Libre 3 app and the current glucose reading.  Pt excited to have the Glucose sensor.  App working appropriately.  Gave pt 1 extra sensor to take home so he will have 2 more sensors to use after the current one expires.  Pt knows to change sensors every 15 days and switch arms.  Did not have questions about giving insulin  at home.  Reminded pt about the importance of the timing of both the ling acting and quick acting insulin .  Pt knows the RN will review d/c doses with him when he discharges home (possibly later today).   Discharge Recommendations: Long acting recommendations: Insulin  Glargine (LANTUS ) Solostar Pen 28 units at Bedtime  Short acting recommendations:  Meal + Correction coverage Insulin  lispro (HUMALOG) KwikPen  Moderate Scale.  4 units with each meals + the Sliding scale dose Supply/Referral recommendations: Glucometer Test strips Lancet device Lancets Pen needles - standard Freestyle Libre 3 Glucose Sensors: Order Number [id number].  Please give 4 refills.   Use Adult Diabetes Insulin  Treatment Post Discharge order set.   --Will follow patient during hospitalization--  Adina Rudolpho Arrow RN, MSN, CDCES Diabetes Coordinator Inpatient  Glycemic Control Team Team Pager: [phone] (8a-5p)

## 2024-08-04 NOTE — Progress Notes (Signed)
 Daily Progress Note   Subjective  - 2 Days Post-Op  Follow-up right foot I&D with excision of sesamoid.  Patient resting comfortably.  No complaints.  Objective Vitals:   08/03/24 1701 08/03/24 2035 08/04/24 0418 08/04/24 0743  BP: 120/76 122/79 113/79 102/70  Pulse: 72 75 70 69  Resp: 16 18 16 18   Temp: 98 F (36.7 C) 98.1 F (36.7 C) 98.1 F (36.7 C) 98 F (36.7 C)  TempSrc:    Oral  SpO2: 96% 97% 98% 99%  Weight:      Height:        Physical Exam: Wound is stable.  No acute infection grossly present.  Minimal surrounding erythema.  Ulceration is stable.  No purulence.  Culture results from 124 showing Enterococcus and Staph aureus.  Laboratory CBC    Component Value Date/Time   WBC 10.9 (H) 08/03/2024 0734   HGB 11.1 (L) 08/03/2024 0734   HGB 13.9 10/30/2011 1123   HCT 33.5 (L) 08/03/2024 0734   HCT 37.0 (L) 10/10/2011 1407   PLT 209 08/03/2024 0734   PLT 191 10/30/2011 1123    BMET    Component Value Date/Time   NA 133 (L) 08/03/2024 0734   NA 140 11/08/2014 0851   NA 136 09/20/2011 0727   K 4.1 08/03/2024 0734   K 4.0 10/10/2011 1407   CL 97 (L) 08/03/2024 0734   CL 103 09/20/2011 0727   CO2 25 08/03/2024 0734   CO2 21 09/20/2011 0727   GLUCOSE 239 (H) 08/03/2024 0734   GLUCOSE 153 (H) 09/20/2011 0727   BUN 15 08/03/2024 0734   BUN 13 11/08/2014 0851   BUN 11 09/20/2011 0727   CREATININE 0.79 08/03/2024 0734   CREATININE 0.75 10/10/2011 1407   CALCIUM  9.1 08/03/2024 0734   CALCIUM  9.2 09/20/2011 0727   GFRNONAA >60 08/03/2024 0734   GFRNONAA >60 10/10/2011 1407   GFRAA >60 12/12/2015 1540   GFRAA >60 10/10/2011 1407    Assessment/Planning: 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  I discussed with the patient dressing changes which would consist of basically Betadine  dressings.  He states his sister is a engineer, civil (consulting) and is willing to perform the dressing changes on a daily basis.  We discussed strict  nonweightbearing to his right foot.  I discussed closely monitoring for any worsening infection. From podiatry standpoint patient is stable for discharge.  Recommend follow-up in 2 weeks in the outpatient clinic.  Would recommend p.o. Augmentin  to cover the Enterococcus and Staph aureus for the next 10 days.  Ashley Soulier A  08/04/2024, 1:45 PM

## 2024-08-04 NOTE — Progress Notes (Signed)
 Mobility Specialist Progress Note:    08/04/24 1014  Mobility  Activity Stood with assistance;Pivoted/transferred from bed to chair  Level of Assistance Contact guard assist, steadying assist  Assistive Device None  RLE Weight Bearing Per Provider Order NWB  Activity Response Tolerated well  Mobility visit 1 Mobility  Mobility Specialist Start Time (ACUTE ONLY) [id number]  Mobility Specialist Stop Time (ACUTE ONLY) 1012  Mobility Specialist Time Calculation (min) (ACUTE ONLY) 19 min   Pt received in bed, agreeable to mobility. Required CGA to stand and pivot to chair. Tolerated well, RLE NWB. Left with needs in reach, all needs met.  Sherrilee Ditty Mobility Specialist Please contact via Special Educational Needs Teacher or  Rehab office at [phone]

## 2024-08-07 LAB — AEROBIC/ANAEROBIC CULTURE W GRAM STAIN (SURGICAL/DEEP WOUND): Gram Stain: NONE SEEN

## 2024-08-08 NOTE — Anesthesia Postprocedure Evaluation (Signed)
"   Anesthesia Post Note  Patient: Daniel Ritter  Procedure(s) Performed: IRRIGATION AND DEBRIDEMENT FOOT (Right: Foot)  Patient location during evaluation: PACU Anesthesia Type: General Level of consciousness: awake and alert Pain management: pain level controlled Vital Signs Assessment: post-procedure vital signs reviewed and stable Respiratory status: spontaneous breathing, nonlabored ventilation, respiratory function stable and patient connected to nasal cannula oxygen Cardiovascular status: blood pressure returned to baseline and stable Postop Assessment: no apparent nausea or vomiting Anesthetic complications: no   No notable events documented.   Last Vitals:  Vitals:   08/04/24 0418 08/04/24 0743  BP: 113/79 102/70  Pulse: 70 69  Resp: 16 18  Temp: 36.7 C 36.7 C  SpO2: 98% 99%    Last Pain:  Vitals:   08/04/24 1203  TempSrc:   PainSc: Asleep                 Lynwood KANDICE Clause      "

## 2024-08-30 ENCOUNTER — Ambulatory Visit: Admitting: Family Medicine
